# Patient Record
Sex: Female | Born: 1960 | Race: Black or African American | Hispanic: No | Marital: Married | Smoking: Never smoker
Health system: Southern US, Community
[De-identification: ages and names within clinical notes are randomized; demographics above are authoritative.]

## PROBLEM LIST (undated history)

## (undated) DIAGNOSIS — B019 Varicella without complication: Secondary | ICD-10-CM

## (undated) DIAGNOSIS — E119 Type 2 diabetes mellitus without complications: Secondary | ICD-10-CM

## (undated) DIAGNOSIS — I609 Nontraumatic subarachnoid hemorrhage, unspecified: Secondary | ICD-10-CM

## (undated) DIAGNOSIS — D649 Anemia, unspecified: Secondary | ICD-10-CM

## (undated) DIAGNOSIS — I639 Cerebral infarction, unspecified: Secondary | ICD-10-CM

## (undated) DIAGNOSIS — M1990 Unspecified osteoarthritis, unspecified site: Secondary | ICD-10-CM

## (undated) DIAGNOSIS — F32A Depression, unspecified: Secondary | ICD-10-CM

## (undated) DIAGNOSIS — T7840XA Allergy, unspecified, initial encounter: Secondary | ICD-10-CM

## (undated) DIAGNOSIS — K219 Gastro-esophageal reflux disease without esophagitis: Secondary | ICD-10-CM

## (undated) DIAGNOSIS — F329 Major depressive disorder, single episode, unspecified: Secondary | ICD-10-CM

## (undated) DIAGNOSIS — J45909 Unspecified asthma, uncomplicated: Secondary | ICD-10-CM

## (undated) DIAGNOSIS — I10 Essential (primary) hypertension: Secondary | ICD-10-CM

## (undated) HISTORY — DX: Allergy, unspecified, initial encounter: T78.40XA

## (undated) HISTORY — DX: Varicella without complication: B01.9

## (undated) HISTORY — DX: Gastro-esophageal reflux disease without esophagitis: K21.9

## (undated) HISTORY — DX: Major depressive disorder, single episode, unspecified: F32.9

## (undated) HISTORY — DX: Cerebral infarction, unspecified: I63.9

## (undated) HISTORY — DX: Unspecified asthma, uncomplicated: J45.909

## (undated) HISTORY — DX: Unspecified osteoarthritis, unspecified site: M19.90

## (undated) HISTORY — PX: ELBOW SURGERY: SHX618

## (undated) HISTORY — DX: Anemia, unspecified: D64.9

## (undated) HISTORY — DX: Depression, unspecified: F32.A

## (undated) HISTORY — DX: Essential (primary) hypertension: I10

## (undated) HISTORY — DX: Type 2 diabetes mellitus without complications: E11.9

## (undated) HISTORY — DX: Nontraumatic subarachnoid hemorrhage, unspecified: I60.9

---

## 1998-02-26 ENCOUNTER — Encounter: Admission: RE | Admit: 1998-02-26 | Discharge: 1998-02-26 | Payer: Self-pay | Admitting: *Deleted

## 1999-01-26 ENCOUNTER — Ambulatory Visit (HOSPITAL_COMMUNITY): Admission: RE | Admit: 1999-01-26 | Discharge: 1999-01-26 | Payer: Self-pay | Admitting: Family Medicine

## 1999-01-26 ENCOUNTER — Encounter: Payer: Self-pay | Admitting: Family Medicine

## 1999-08-05 ENCOUNTER — Other Ambulatory Visit: Admission: RE | Admit: 1999-08-05 | Discharge: 1999-08-05 | Payer: Self-pay | Admitting: Family Medicine

## 1999-08-11 ENCOUNTER — Ambulatory Visit (HOSPITAL_COMMUNITY): Admission: RE | Admit: 1999-08-11 | Discharge: 1999-08-11 | Payer: Self-pay | Admitting: Family Medicine

## 1999-08-11 ENCOUNTER — Encounter: Payer: Self-pay | Admitting: Family Medicine

## 1999-09-14 ENCOUNTER — Encounter: Admission: RE | Admit: 1999-09-14 | Discharge: 1999-12-13 | Payer: Self-pay | Admitting: Family Medicine

## 2000-07-15 ENCOUNTER — Ambulatory Visit (HOSPITAL_COMMUNITY): Admission: RE | Admit: 2000-07-15 | Discharge: 2000-07-15 | Payer: Self-pay | Admitting: Family Medicine

## 2000-07-15 ENCOUNTER — Encounter: Payer: Self-pay | Admitting: Family Medicine

## 2000-07-26 ENCOUNTER — Encounter: Admission: RE | Admit: 2000-07-26 | Discharge: 2000-07-26 | Payer: Self-pay | Admitting: Family Medicine

## 2000-07-26 ENCOUNTER — Encounter: Payer: Self-pay | Admitting: Family Medicine

## 2000-08-09 ENCOUNTER — Encounter: Admission: RE | Admit: 2000-08-09 | Discharge: 2000-11-07 | Payer: Self-pay | Admitting: Anesthesiology

## 2001-07-14 ENCOUNTER — Encounter: Payer: Self-pay | Admitting: Family Medicine

## 2001-07-14 ENCOUNTER — Encounter: Admission: RE | Admit: 2001-07-14 | Discharge: 2001-07-14 | Payer: Self-pay | Admitting: Family Medicine

## 2004-04-28 ENCOUNTER — Ambulatory Visit: Payer: Self-pay | Admitting: *Deleted

## 2004-10-27 ENCOUNTER — Encounter: Admission: RE | Admit: 2004-10-27 | Discharge: 2004-10-27 | Payer: Self-pay | Admitting: Family Medicine

## 2004-11-09 HISTORY — PX: KNEE ARTHROSCOPY: SUR90

## 2004-11-10 ENCOUNTER — Encounter: Admission: RE | Admit: 2004-11-10 | Discharge: 2004-11-10 | Payer: Self-pay | Admitting: Family Medicine

## 2004-11-16 ENCOUNTER — Ambulatory Visit (HOSPITAL_BASED_OUTPATIENT_CLINIC_OR_DEPARTMENT_OTHER): Admission: RE | Admit: 2004-11-16 | Discharge: 2004-11-16 | Payer: Self-pay | Admitting: Orthopedic Surgery

## 2004-11-16 ENCOUNTER — Ambulatory Visit (HOSPITAL_COMMUNITY): Admission: RE | Admit: 2004-11-16 | Discharge: 2004-11-16 | Payer: Self-pay | Admitting: Orthopedic Surgery

## 2005-09-09 HISTORY — PX: TENDON REPAIR: SHX5111

## 2005-10-15 ENCOUNTER — Encounter: Admission: RE | Admit: 2005-10-15 | Discharge: 2005-10-15 | Payer: Self-pay | Admitting: Family Medicine

## 2006-07-12 HISTORY — PX: ACHILLES TENDON REPAIR: SUR1153

## 2006-10-12 ENCOUNTER — Encounter: Admission: RE | Admit: 2006-10-12 | Discharge: 2006-10-12 | Payer: Self-pay

## 2007-07-24 ENCOUNTER — Emergency Department (HOSPITAL_COMMUNITY): Admission: EM | Admit: 2007-07-24 | Discharge: 2007-07-25 | Payer: Self-pay | Admitting: Emergency Medicine

## 2008-01-10 HISTORY — PX: ACHILLES TENDON REPAIR: SUR1153

## 2009-09-09 ENCOUNTER — Encounter: Admission: RE | Admit: 2009-09-09 | Discharge: 2009-09-09 | Payer: Self-pay | Admitting: Family Medicine

## 2009-09-09 DIAGNOSIS — I609 Nontraumatic subarachnoid hemorrhage, unspecified: Secondary | ICD-10-CM

## 2009-09-09 HISTORY — DX: Nontraumatic subarachnoid hemorrhage, unspecified: I60.9

## 2009-09-22 ENCOUNTER — Inpatient Hospital Stay (HOSPITAL_COMMUNITY): Admission: EM | Admit: 2009-09-22 | Discharge: 2009-10-25 | Payer: Self-pay | Admitting: Emergency Medicine

## 2009-09-23 ENCOUNTER — Encounter (INDEPENDENT_AMBULATORY_CARE_PROVIDER_SITE_OTHER): Payer: Self-pay | Admitting: Neurosurgery

## 2009-09-26 ENCOUNTER — Encounter (INDEPENDENT_AMBULATORY_CARE_PROVIDER_SITE_OTHER): Payer: Self-pay | Admitting: Neurosurgery

## 2009-10-06 ENCOUNTER — Ambulatory Visit: Payer: Self-pay | Admitting: Internal Medicine

## 2010-07-09 ENCOUNTER — Ambulatory Visit (HOSPITAL_COMMUNITY)
Admission: RE | Admit: 2010-07-09 | Discharge: 2010-07-09 | Payer: Self-pay | Source: Home / Self Care | Attending: Neurosurgery | Admitting: Neurosurgery

## 2010-07-22 ENCOUNTER — Ambulatory Visit (HOSPITAL_COMMUNITY)
Admission: RE | Admit: 2010-07-22 | Discharge: 2010-07-22 | Payer: Self-pay | Source: Home / Self Care | Attending: Neurosurgery | Admitting: Neurosurgery

## 2010-08-19 ENCOUNTER — Ambulatory Visit (HOSPITAL_COMMUNITY)
Admission: RE | Admit: 2010-08-19 | Discharge: 2010-08-19 | Disposition: A | Discharge status: Home / Self Care | Payer: BC Managed Care – PPO | Source: Ambulatory Visit | Attending: Physician Assistant | Admitting: Physician Assistant

## 2010-08-19 ENCOUNTER — Other Ambulatory Visit (HOSPITAL_COMMUNITY): Payer: Self-pay | Admitting: Physician Assistant

## 2010-08-19 DIAGNOSIS — R52 Pain, unspecified: Secondary | ICD-10-CM

## 2010-08-19 DIAGNOSIS — M169 Osteoarthritis of hip, unspecified: Secondary | ICD-10-CM | POA: Insufficient documentation

## 2010-08-19 DIAGNOSIS — M1610 Unilateral primary osteoarthritis, unspecified hip: Secondary | ICD-10-CM | POA: Insufficient documentation

## 2010-08-19 DIAGNOSIS — M25559 Pain in unspecified hip: Secondary | ICD-10-CM | POA: Insufficient documentation

## 2010-09-29 LAB — GLUCOSE, CAPILLARY
Glucose-Capillary: 125 mg/dL — ABNORMAL HIGH (ref 70–99)
Glucose-Capillary: 175 mg/dL — ABNORMAL HIGH (ref 70–99)
Glucose-Capillary: 178 mg/dL — ABNORMAL HIGH (ref 70–99)
Glucose-Capillary: 238 mg/dL — ABNORMAL HIGH (ref 70–99)
Glucose-Capillary: 238 mg/dL — ABNORMAL HIGH (ref 70–99)
Glucose-Capillary: 99 mg/dL (ref 70–99)

## 2010-09-30 LAB — GLUCOSE, CAPILLARY
Glucose-Capillary: 100 mg/dL — ABNORMAL HIGH (ref 70–99)
Glucose-Capillary: 112 mg/dL — ABNORMAL HIGH (ref 70–99)
Glucose-Capillary: 113 mg/dL — ABNORMAL HIGH (ref 70–99)
Glucose-Capillary: 117 mg/dL — ABNORMAL HIGH (ref 70–99)
Glucose-Capillary: 117 mg/dL — ABNORMAL HIGH (ref 70–99)
Glucose-Capillary: 118 mg/dL — ABNORMAL HIGH (ref 70–99)
Glucose-Capillary: 121 mg/dL — ABNORMAL HIGH (ref 70–99)
Glucose-Capillary: 125 mg/dL — ABNORMAL HIGH (ref 70–99)
Glucose-Capillary: 135 mg/dL — ABNORMAL HIGH (ref 70–99)
Glucose-Capillary: 136 mg/dL — ABNORMAL HIGH (ref 70–99)
Glucose-Capillary: 139 mg/dL — ABNORMAL HIGH (ref 70–99)
Glucose-Capillary: 143 mg/dL — ABNORMAL HIGH (ref 70–99)
Glucose-Capillary: 144 mg/dL — ABNORMAL HIGH (ref 70–99)
Glucose-Capillary: 145 mg/dL — ABNORMAL HIGH (ref 70–99)
Glucose-Capillary: 148 mg/dL — ABNORMAL HIGH (ref 70–99)
Glucose-Capillary: 150 mg/dL — ABNORMAL HIGH (ref 70–99)
Glucose-Capillary: 152 mg/dL — ABNORMAL HIGH (ref 70–99)
Glucose-Capillary: 156 mg/dL — ABNORMAL HIGH (ref 70–99)
Glucose-Capillary: 159 mg/dL — ABNORMAL HIGH (ref 70–99)
Glucose-Capillary: 164 mg/dL — ABNORMAL HIGH (ref 70–99)
Glucose-Capillary: 165 mg/dL — ABNORMAL HIGH (ref 70–99)
Glucose-Capillary: 166 mg/dL — ABNORMAL HIGH (ref 70–99)
Glucose-Capillary: 170 mg/dL — ABNORMAL HIGH (ref 70–99)
Glucose-Capillary: 170 mg/dL — ABNORMAL HIGH (ref 70–99)
Glucose-Capillary: 171 mg/dL — ABNORMAL HIGH (ref 70–99)
Glucose-Capillary: 177 mg/dL — ABNORMAL HIGH (ref 70–99)
Glucose-Capillary: 192 mg/dL — ABNORMAL HIGH (ref 70–99)
Glucose-Capillary: 193 mg/dL — ABNORMAL HIGH (ref 70–99)
Glucose-Capillary: 195 mg/dL — ABNORMAL HIGH (ref 70–99)
Glucose-Capillary: 197 mg/dL — ABNORMAL HIGH (ref 70–99)
Glucose-Capillary: 198 mg/dL — ABNORMAL HIGH (ref 70–99)
Glucose-Capillary: 199 mg/dL — ABNORMAL HIGH (ref 70–99)
Glucose-Capillary: 200 mg/dL — ABNORMAL HIGH (ref 70–99)
Glucose-Capillary: 201 mg/dL — ABNORMAL HIGH (ref 70–99)
Glucose-Capillary: 206 mg/dL — ABNORMAL HIGH (ref 70–99)
Glucose-Capillary: 210 mg/dL — ABNORMAL HIGH (ref 70–99)
Glucose-Capillary: 225 mg/dL — ABNORMAL HIGH (ref 70–99)
Glucose-Capillary: 226 mg/dL — ABNORMAL HIGH (ref 70–99)
Glucose-Capillary: 236 mg/dL — ABNORMAL HIGH (ref 70–99)
Glucose-Capillary: 239 mg/dL — ABNORMAL HIGH (ref 70–99)
Glucose-Capillary: 240 mg/dL — ABNORMAL HIGH (ref 70–99)
Glucose-Capillary: 243 mg/dL — ABNORMAL HIGH (ref 70–99)
Glucose-Capillary: 261 mg/dL — ABNORMAL HIGH (ref 70–99)
Glucose-Capillary: 262 mg/dL — ABNORMAL HIGH (ref 70–99)
Glucose-Capillary: 268 mg/dL — ABNORMAL HIGH (ref 70–99)
Glucose-Capillary: 271 mg/dL — ABNORMAL HIGH (ref 70–99)
Glucose-Capillary: 272 mg/dL — ABNORMAL HIGH (ref 70–99)
Glucose-Capillary: 273 mg/dL — ABNORMAL HIGH (ref 70–99)
Glucose-Capillary: 278 mg/dL — ABNORMAL HIGH (ref 70–99)
Glucose-Capillary: 295 mg/dL — ABNORMAL HIGH (ref 70–99)
Glucose-Capillary: 297 mg/dL — ABNORMAL HIGH (ref 70–99)
Glucose-Capillary: 309 mg/dL — ABNORMAL HIGH (ref 70–99)
Glucose-Capillary: 311 mg/dL — ABNORMAL HIGH (ref 70–99)
Glucose-Capillary: 311 mg/dL — ABNORMAL HIGH (ref 70–99)
Glucose-Capillary: 315 mg/dL — ABNORMAL HIGH (ref 70–99)
Glucose-Capillary: 85 mg/dL (ref 70–99)

## 2010-09-30 LAB — CSF CULTURE W GRAM STAIN
Culture: NO GROWTH
Culture: NO GROWTH
Gram Stain: NONE SEEN

## 2010-09-30 LAB — CBC
HCT: 32.6 % — ABNORMAL LOW (ref 36.0–46.0)
HCT: 34.1 % — ABNORMAL LOW (ref 36.0–46.0)
Hemoglobin: 10.7 g/dL — ABNORMAL LOW (ref 12.0–15.0)
Hemoglobin: 11 g/dL — ABNORMAL LOW (ref 12.0–15.0)
MCHC: 32.3 g/dL (ref 30.0–36.0)
MCHC: 33 g/dL (ref 30.0–36.0)
MCV: 77 fL — ABNORMAL LOW (ref 78.0–100.0)
MCV: 77.4 fL — ABNORMAL LOW (ref 78.0–100.0)
Platelets: 328 10*3/uL (ref 150–400)
Platelets: 358 10*3/uL (ref 150–400)
RBC: 4.23 MIL/uL (ref 3.87–5.11)
RBC: 4.4 MIL/uL (ref 3.87–5.11)
RDW: 17.8 % — ABNORMAL HIGH (ref 11.5–15.5)
RDW: 18.6 % — ABNORMAL HIGH (ref 11.5–15.5)
WBC: 7.8 10*3/uL (ref 4.0–10.5)
WBC: 8.3 10*3/uL (ref 4.0–10.5)

## 2010-09-30 LAB — BASIC METABOLIC PANEL
BUN: 5 mg/dL — ABNORMAL LOW (ref 6–23)
BUN: 7 mg/dL (ref 6–23)
BUN: 7 mg/dL (ref 6–23)
BUN: 8 mg/dL (ref 6–23)
CO2: 24 mEq/L (ref 19–32)
CO2: 27 mEq/L (ref 19–32)
CO2: 28 mEq/L (ref 19–32)
CO2: 28 mEq/L (ref 19–32)
Calcium: 9.2 mg/dL (ref 8.4–10.5)
Calcium: 9.3 mg/dL (ref 8.4–10.5)
Calcium: 9.3 mg/dL (ref 8.4–10.5)
Calcium: 9.4 mg/dL (ref 8.4–10.5)
Chloride: 101 mEq/L (ref 96–112)
Chloride: 101 mEq/L (ref 96–112)
Chloride: 102 mEq/L (ref 96–112)
Chloride: 104 mEq/L (ref 96–112)
Creatinine, Ser: 0.64 mg/dL (ref 0.4–1.2)
Creatinine, Ser: 0.78 mg/dL (ref 0.4–1.2)
Creatinine, Ser: 0.81 mg/dL (ref 0.4–1.2)
Creatinine, Ser: 0.9 mg/dL (ref 0.4–1.2)
GFR calc Af Amer: >60 mL/min (ref >60)
GFR calc Af Amer: >60 mL/min (ref >60)
GFR calc Af Amer: >60 mL/min (ref >60)
GFR calc Af Amer: >60 mL/min (ref >60)
GFR calc non Af Amer: >60 mL/min (ref >60)
GFR calc non Af Amer: >60 mL/min (ref >60)
GFR calc non Af Amer: >60 mL/min (ref >60)
GFR calc non Af Amer: >60 mL/min (ref >60)
Glucose, Bld: 231 mg/dL — ABNORMAL HIGH (ref 70–99)
Glucose, Bld: 307 mg/dL — ABNORMAL HIGH (ref 70–99)
Glucose, Bld: 344 mg/dL — ABNORMAL HIGH (ref 70–99)
Glucose, Bld: 79 mg/dL (ref 70–99)
Potassium: 3.3 mEq/L — ABNORMAL LOW (ref 3.5–5.1)
Potassium: 4 mEq/L (ref 3.5–5.1)
Potassium: 4.3 mEq/L (ref 3.5–5.1)
Potassium: 4.4 mEq/L (ref 3.5–5.1)
Sodium: 133 mEq/L — ABNORMAL LOW (ref 135–145)
Sodium: 133 mEq/L — ABNORMAL LOW (ref 135–145)
Sodium: 135 mEq/L (ref 135–145)
Sodium: 139 mEq/L (ref 135–145)

## 2010-09-30 LAB — CSF CELL COUNT WITH DIFFERENTIAL
Lymphs, CSF: 78 % (ref 40–80)
Monocyte-Macrophage-Spinal Fluid: 3 % — ABNORMAL LOW (ref 15–45)
RBC Count, CSF: 18 /mm3 — ABNORMAL HIGH
RBC Count, CSF: 2 /mm3 — ABNORMAL HIGH
Segmented Neutrophils-CSF: 19 % — ABNORMAL HIGH (ref 0–6)
Tube #: 2
WBC, CSF: 18 /mm3 (ref 0–5)
WBC, CSF: 9 /mm3 — ABNORMAL HIGH (ref 0–5)

## 2010-09-30 LAB — DIFFERENTIAL
Basophils Absolute: 0 10*3/uL (ref 0.0–0.1)
Basophils Relative: 0 % (ref 0–1)
Eosinophils Absolute: 0 10*3/uL (ref 0.0–0.7)
Eosinophils Relative: 0 % (ref 0–5)
Lymphocytes Relative: 10 % — ABNORMAL LOW (ref 12–46)
Lymphs Abs: 0.8 10*3/uL (ref 0.7–4.0)
Monocytes Absolute: 0.3 10*3/uL (ref 0.1–1.0)
Monocytes Relative: 3 % (ref 3–12)
Neutro Abs: 7.1 10*3/uL (ref 1.7–7.7)
Neutrophils Relative %: 86 % — ABNORMAL HIGH (ref 43–77)

## 2010-09-30 LAB — PROTEIN AND GLUCOSE, CSF
Glucose, CSF: 100 mg/dL — ABNORMAL HIGH (ref 43–76)
Glucose, CSF: 54 mg/dL (ref 43–76)
Total  Protein, CSF: 20 mg/dL (ref 15–45)
Total  Protein, CSF: 30 mg/dL (ref 15–45)

## 2010-09-30 LAB — PATHOLOGIST SMEAR REVIEW

## 2010-09-30 LAB — VANCOMYCIN, TROUGH
Vancomycin Tr: 12.3 ug/mL (ref 10.0–20.0)
Vancomycin Tr: 18.5 ug/mL (ref 10.0–20.0)
Vancomycin Tr: 23.5 ug/mL — ABNORMAL HIGH (ref 10.0–20.0)

## 2010-09-30 LAB — APTT: aPTT: 29 seconds (ref 24–37)

## 2010-09-30 LAB — GRAM STAIN

## 2010-09-30 LAB — PROTIME-INR
INR: 1.07 (ref 0.00–1.49)
Prothrombin Time: 13.8 seconds (ref 11.6–15.2)

## 2010-10-04 LAB — GLUCOSE, CAPILLARY
Glucose-Capillary: 128 mg/dL — ABNORMAL HIGH (ref 70–99)
Glucose-Capillary: 132 mg/dL — ABNORMAL HIGH (ref 70–99)
Glucose-Capillary: 164 mg/dL — ABNORMAL HIGH (ref 70–99)
Glucose-Capillary: 169 mg/dL — ABNORMAL HIGH (ref 70–99)
Glucose-Capillary: 180 mg/dL — ABNORMAL HIGH (ref 70–99)
Glucose-Capillary: 183 mg/dL — ABNORMAL HIGH (ref 70–99)
Glucose-Capillary: 184 mg/dL — ABNORMAL HIGH (ref 70–99)
Glucose-Capillary: 186 mg/dL — ABNORMAL HIGH (ref 70–99)
Glucose-Capillary: 187 mg/dL — ABNORMAL HIGH (ref 70–99)
Glucose-Capillary: 189 mg/dL — ABNORMAL HIGH (ref 70–99)
Glucose-Capillary: 201 mg/dL — ABNORMAL HIGH (ref 70–99)
Glucose-Capillary: 207 mg/dL — ABNORMAL HIGH (ref 70–99)
Glucose-Capillary: 208 mg/dL — ABNORMAL HIGH (ref 70–99)
Glucose-Capillary: 208 mg/dL — ABNORMAL HIGH (ref 70–99)
Glucose-Capillary: 210 mg/dL — ABNORMAL HIGH (ref 70–99)
Glucose-Capillary: 210 mg/dL — ABNORMAL HIGH (ref 70–99)
Glucose-Capillary: 211 mg/dL — ABNORMAL HIGH (ref 70–99)
Glucose-Capillary: 212 mg/dL — ABNORMAL HIGH (ref 70–99)
Glucose-Capillary: 212 mg/dL — ABNORMAL HIGH (ref 70–99)
Glucose-Capillary: 214 mg/dL — ABNORMAL HIGH (ref 70–99)
Glucose-Capillary: 216 mg/dL — ABNORMAL HIGH (ref 70–99)
Glucose-Capillary: 217 mg/dL — ABNORMAL HIGH (ref 70–99)
Glucose-Capillary: 221 mg/dL — ABNORMAL HIGH (ref 70–99)
Glucose-Capillary: 222 mg/dL — ABNORMAL HIGH (ref 70–99)
Glucose-Capillary: 222 mg/dL — ABNORMAL HIGH (ref 70–99)
Glucose-Capillary: 222 mg/dL — ABNORMAL HIGH (ref 70–99)
Glucose-Capillary: 223 mg/dL — ABNORMAL HIGH (ref 70–99)
Glucose-Capillary: 228 mg/dL — ABNORMAL HIGH (ref 70–99)
Glucose-Capillary: 229 mg/dL — ABNORMAL HIGH (ref 70–99)
Glucose-Capillary: 229 mg/dL — ABNORMAL HIGH (ref 70–99)
Glucose-Capillary: 229 mg/dL — ABNORMAL HIGH (ref 70–99)
Glucose-Capillary: 231 mg/dL — ABNORMAL HIGH (ref 70–99)
Glucose-Capillary: 231 mg/dL — ABNORMAL HIGH (ref 70–99)
Glucose-Capillary: 231 mg/dL — ABNORMAL HIGH (ref 70–99)
Glucose-Capillary: 233 mg/dL — ABNORMAL HIGH (ref 70–99)
Glucose-Capillary: 236 mg/dL — ABNORMAL HIGH (ref 70–99)
Glucose-Capillary: 238 mg/dL — ABNORMAL HIGH (ref 70–99)
Glucose-Capillary: 241 mg/dL — ABNORMAL HIGH (ref 70–99)
Glucose-Capillary: 243 mg/dL — ABNORMAL HIGH (ref 70–99)
Glucose-Capillary: 245 mg/dL — ABNORMAL HIGH (ref 70–99)
Glucose-Capillary: 245 mg/dL — ABNORMAL HIGH (ref 70–99)
Glucose-Capillary: 248 mg/dL — ABNORMAL HIGH (ref 70–99)
Glucose-Capillary: 251 mg/dL — ABNORMAL HIGH (ref 70–99)
Glucose-Capillary: 256 mg/dL — ABNORMAL HIGH (ref 70–99)
Glucose-Capillary: 258 mg/dL — ABNORMAL HIGH (ref 70–99)
Glucose-Capillary: 261 mg/dL — ABNORMAL HIGH (ref 70–99)
Glucose-Capillary: 262 mg/dL — ABNORMAL HIGH (ref 70–99)
Glucose-Capillary: 263 mg/dL — ABNORMAL HIGH (ref 70–99)
Glucose-Capillary: 269 mg/dL — ABNORMAL HIGH (ref 70–99)
Glucose-Capillary: 274 mg/dL — ABNORMAL HIGH (ref 70–99)
Glucose-Capillary: 274 mg/dL — ABNORMAL HIGH (ref 70–99)
Glucose-Capillary: 275 mg/dL — ABNORMAL HIGH (ref 70–99)
Glucose-Capillary: 276 mg/dL — ABNORMAL HIGH (ref 70–99)
Glucose-Capillary: 278 mg/dL — ABNORMAL HIGH (ref 70–99)
Glucose-Capillary: 278 mg/dL — ABNORMAL HIGH (ref 70–99)
Glucose-Capillary: 280 mg/dL — ABNORMAL HIGH (ref 70–99)
Glucose-Capillary: 284 mg/dL — ABNORMAL HIGH (ref 70–99)
Glucose-Capillary: 285 mg/dL — ABNORMAL HIGH (ref 70–99)
Glucose-Capillary: 285 mg/dL — ABNORMAL HIGH (ref 70–99)
Glucose-Capillary: 291 mg/dL — ABNORMAL HIGH (ref 70–99)
Glucose-Capillary: 292 mg/dL — ABNORMAL HIGH (ref 70–99)
Glucose-Capillary: 294 mg/dL — ABNORMAL HIGH (ref 70–99)
Glucose-Capillary: 299 mg/dL — ABNORMAL HIGH (ref 70–99)
Glucose-Capillary: 302 mg/dL — ABNORMAL HIGH (ref 70–99)
Glucose-Capillary: 305 mg/dL — ABNORMAL HIGH (ref 70–99)
Glucose-Capillary: 306 mg/dL — ABNORMAL HIGH (ref 70–99)
Glucose-Capillary: 307 mg/dL — ABNORMAL HIGH (ref 70–99)
Glucose-Capillary: 323 mg/dL — ABNORMAL HIGH (ref 70–99)
Glucose-Capillary: 323 mg/dL — ABNORMAL HIGH (ref 70–99)
Glucose-Capillary: 328 mg/dL — ABNORMAL HIGH (ref 70–99)
Glucose-Capillary: 335 mg/dL — ABNORMAL HIGH (ref 70–99)
Glucose-Capillary: 335 mg/dL — ABNORMAL HIGH (ref 70–99)
Glucose-Capillary: 339 mg/dL — ABNORMAL HIGH (ref 70–99)
Glucose-Capillary: 342 mg/dL — ABNORMAL HIGH (ref 70–99)
Glucose-Capillary: 346 mg/dL — ABNORMAL HIGH (ref 70–99)
Glucose-Capillary: 347 mg/dL — ABNORMAL HIGH (ref 70–99)
Glucose-Capillary: 354 mg/dL — ABNORMAL HIGH (ref 70–99)
Glucose-Capillary: 358 mg/dL — ABNORMAL HIGH (ref 70–99)
Glucose-Capillary: 370 mg/dL — ABNORMAL HIGH (ref 70–99)
Glucose-Capillary: 381 mg/dL — ABNORMAL HIGH (ref 70–99)
Glucose-Capillary: 403 mg/dL — ABNORMAL HIGH (ref 70–99)
Glucose-Capillary: 424 mg/dL — ABNORMAL HIGH (ref 70–99)

## 2010-10-04 LAB — URINE CULTURE: Colony Count: 1000

## 2010-10-04 LAB — BASIC METABOLIC PANEL
BUN: 16 mg/dL (ref 6–23)
BUN: 20 mg/dL (ref 6–23)
BUN: 8 mg/dL (ref 6–23)
BUN: 9 mg/dL (ref 6–23)
CO2: 24 mEq/L (ref 19–32)
CO2: 24 mEq/L (ref 19–32)
CO2: 26 mEq/L (ref 19–32)
CO2: 27 mEq/L (ref 19–32)
Calcium: 8.5 mg/dL (ref 8.4–10.5)
Calcium: 8.9 mg/dL (ref 8.4–10.5)
Calcium: 9.1 mg/dL (ref 8.4–10.5)
Calcium: 9.3 mg/dL (ref 8.4–10.5)
Chloride: 103 mEq/L (ref 96–112)
Chloride: 104 mEq/L (ref 96–112)
Chloride: 94 mEq/L — ABNORMAL LOW (ref 96–112)
Chloride: 98 mEq/L (ref 96–112)
Creatinine, Ser: 0.8 mg/dL (ref 0.4–1.2)
Creatinine, Ser: 0.89 mg/dL (ref 0.4–1.2)
Creatinine, Ser: 0.93 mg/dL (ref 0.4–1.2)
Creatinine, Ser: 1.05 mg/dL (ref 0.4–1.2)
GFR calc Af Amer: >60 mL/min (ref >60)
GFR calc Af Amer: >60 mL/min (ref >60)
GFR calc Af Amer: >60 mL/min (ref >60)
GFR calc Af Amer: >60 mL/min (ref >60)
GFR calc non Af Amer: 55 mL/min — ABNORMAL LOW (ref >60)
GFR calc non Af Amer: >60 mL/min (ref >60)
GFR calc non Af Amer: >60 mL/min (ref >60)
GFR calc non Af Amer: >60 mL/min (ref >60)
Glucose, Bld: 167 mg/dL — ABNORMAL HIGH (ref 70–99)
Glucose, Bld: 274 mg/dL — ABNORMAL HIGH (ref 70–99)
Glucose, Bld: 292 mg/dL — ABNORMAL HIGH (ref 70–99)
Glucose, Bld: 320 mg/dL — ABNORMAL HIGH (ref 70–99)
Potassium: 3.5 mEq/L (ref 3.5–5.1)
Potassium: 3.8 mEq/L (ref 3.5–5.1)
Potassium: 4 mEq/L (ref 3.5–5.1)
Potassium: 4 mEq/L (ref 3.5–5.1)
Sodium: 128 mEq/L — ABNORMAL LOW (ref 135–145)
Sodium: 132 mEq/L — ABNORMAL LOW (ref 135–145)
Sodium: 133 mEq/L — ABNORMAL LOW (ref 135–145)
Sodium: 137 mEq/L (ref 135–145)

## 2010-10-04 LAB — CBC
HCT: 30.1 % — ABNORMAL LOW (ref 36.0–46.0)
HCT: 31 % — ABNORMAL LOW (ref 36.0–46.0)
HCT: 32.7 % — ABNORMAL LOW (ref 36.0–46.0)
Hemoglobin: 10.1 g/dL — ABNORMAL LOW (ref 12.0–15.0)
Hemoglobin: 10.6 g/dL — ABNORMAL LOW (ref 12.0–15.0)
Hemoglobin: 9.9 g/dL — ABNORMAL LOW (ref 12.0–15.0)
MCHC: 32.3 g/dL (ref 30.0–36.0)
MCHC: 32.6 g/dL (ref 30.0–36.0)
MCHC: 32.8 g/dL (ref 30.0–36.0)
MCV: 75.8 fL — ABNORMAL LOW (ref 78.0–100.0)
MCV: 76.2 fL — ABNORMAL LOW (ref 78.0–100.0)
MCV: 77.3 fL — ABNORMAL LOW (ref 78.0–100.0)
Platelets: 294 10*3/uL (ref 150–400)
Platelets: 317 10*3/uL (ref 150–400)
Platelets: 338 10*3/uL (ref 150–400)
RBC: 3.97 MIL/uL (ref 3.87–5.11)
RBC: 4.07 MIL/uL (ref 3.87–5.11)
RBC: 4.23 MIL/uL (ref 3.87–5.11)
RDW: 16.1 % — ABNORMAL HIGH (ref 11.5–15.5)
RDW: 16.5 % — ABNORMAL HIGH (ref 11.5–15.5)
RDW: 16.7 % — ABNORMAL HIGH (ref 11.5–15.5)
WBC: 10.7 10*3/uL — ABNORMAL HIGH (ref 4.0–10.5)
WBC: 11.7 10*3/uL — ABNORMAL HIGH (ref 4.0–10.5)
WBC: 16.8 10*3/uL — ABNORMAL HIGH (ref 4.0–10.5)

## 2010-10-04 LAB — CSF CELL COUNT WITH DIFFERENTIAL
Eosinophils, CSF: 0 % (ref 0–1)
Lymphs, CSF: 28 % — ABNORMAL LOW (ref 40–80)
Monocyte-Macrophage-Spinal Fluid: 12 % — ABNORMAL LOW (ref 15–45)
RBC Count, CSF: 610 /mm3 — ABNORMAL HIGH
Segmented Neutrophils-CSF: 60 % — ABNORMAL HIGH (ref 0–6)
Tube #: 3
WBC, CSF: 160 /mm3 (ref 0–5)

## 2010-10-04 LAB — DIFFERENTIAL
Basophils Absolute: 0 10*3/uL (ref 0.0–0.1)
Basophils Absolute: 0 10*3/uL (ref 0.0–0.1)
Basophils Relative: 0 % (ref 0–1)
Basophils Relative: 0 % (ref 0–1)
Eosinophils Absolute: 0 10*3/uL (ref 0.0–0.7)
Eosinophils Absolute: 0 10*3/uL (ref 0.0–0.7)
Eosinophils Relative: 0 % (ref 0–5)
Eosinophils Relative: 0 % (ref 0–5)
Lymphocytes Relative: 16 % (ref 12–46)
Lymphocytes Relative: 9 % — ABNORMAL LOW (ref 12–46)
Lymphs Abs: 1.5 10*3/uL (ref 0.7–4.0)
Lymphs Abs: 1.7 10*3/uL (ref 0.7–4.0)
Monocytes Absolute: 0.5 10*3/uL (ref 0.1–1.0)
Monocytes Absolute: 1.2 10*3/uL — ABNORMAL HIGH (ref 0.1–1.0)
Monocytes Relative: 11 % (ref 3–12)
Monocytes Relative: 3 % (ref 3–12)
Neutro Abs: 14.9 10*3/uL — ABNORMAL HIGH (ref 1.7–7.7)
Neutro Abs: 7.8 10*3/uL — ABNORMAL HIGH (ref 1.7–7.7)
Neutrophils Relative %: 73 % (ref 43–77)
Neutrophils Relative %: 88 % — ABNORMAL HIGH (ref 43–77)

## 2010-10-04 LAB — URINALYSIS, ROUTINE W REFLEX MICROSCOPIC
Bilirubin Urine: NEGATIVE
Glucose, UA: 250 mg/dL — AB
Ketones, ur: NEGATIVE mg/dL
Nitrite: NEGATIVE
Protein, ur: NEGATIVE mg/dL
Specific Gravity, Urine: 1.009 (ref 1.005–1.030)
Urobilinogen, UA: 1 mg/dL (ref 0.0–1.0)
pH: 7.5 (ref 5.0–8.0)

## 2010-10-04 LAB — URINE MICROSCOPIC-ADD ON

## 2010-10-04 LAB — PROTIME-INR
INR: 0.99 (ref 0.00–1.49)
Prothrombin Time: 13 seconds (ref 11.6–15.2)

## 2010-10-04 LAB — CULTURE, BLOOD (ROUTINE X 2): Culture: NO GROWTH

## 2010-10-04 LAB — POCT CARDIAC MARKERS
CKMB, poc: 1.9 ng/mL (ref 1.0–8.0)
Myoglobin, poc: 125 ng/mL (ref 12–200)
Troponin i, poc: <0.05 ng/mL (ref 0.00–0.09)

## 2010-10-04 LAB — CSF CULTURE W GRAM STAIN

## 2010-10-04 LAB — PROTEIN AND GLUCOSE, CSF
Glucose, CSF: 94 mg/dL — ABNORMAL HIGH (ref 43–76)
Total  Protein, CSF: 46 mg/dL — ABNORMAL HIGH (ref 15–45)

## 2010-10-04 LAB — VANCOMYCIN, TROUGH: Vancomycin Tr: 16.6 ug/mL (ref 10.0–20.0)

## 2010-10-04 LAB — PATHOLOGIST SMEAR REVIEW

## 2010-10-04 LAB — APTT: aPTT: 21 seconds — ABNORMAL LOW (ref 24–37)

## 2010-10-04 LAB — MRSA PCR SCREENING: MRSA by PCR: NEGATIVE

## 2010-11-27 NOTE — Op Note (Signed)
[facility]  Patient:    Carla Jensen, Carla Jensen                        MRN: [id number] Proc. Date: 08/17/00 Adm. Date:  [id number] Attending:  Thyra Breed CC:         Sharlot Gowda., M.D.   Operative Report  PROCEDURE:  Lumbar epidural steroid injection.  DIAGNOSIS:  Degenerative disk disease and facet joint arthropathy with radiculopathy into the right lower extremity.  ANESTHESIOLOGIST:  Thyra Breed, M.D.  INTERVAL HISTORY: The patient has noted that her radicular discomfort is markedly improved.  She still had some left-sided lower back discomfort, but overall she feels better.  PHYSICAL EXAMINATION:  VITAL SIGNS:  Blood pressure 130/55, heart rate 110, respiratory rate 20, O2 saturation 98%, pain level 7/10.  GENERAL:  The patient is very anxious today.  BACK:  Shows good healing from previous injection site.  DESCRIPTION OF PROCEDURE:  After informed consent was obtained, the patient was placed in the sitting position and monitored.  Her back was prepped with Betadine x 3.  A skin wheal was raised at the L4-5 interspace with 1% lidocaine.  A 20-gauge Tuohy needle was introduced in the lumbar epidural space to loss of resistance to preservative free normal saline.  There was no CSF nor blood.  Medrol 40 mg and 5 ml preservative free normal saline was gently injected.  The needle was flushed with preservative free normal saline and removed intact.  POSTPROCEDURE CONDITION:  Stable.  DISCHARGE INSTRUCTIONS: 1. Resume previous diet. 2. Limitation of activities per instruction sheet. 3. Continue on current medications. 4. Follow up with me in one week for a third injection. DD:  08/17/00 TD:  08/18/00 Job: [id number]

## 2010-11-27 NOTE — Procedures (Signed)
[facility]  Patient:    Carla Jensen, Carla Jensen                        MRN: [id number] Proc. Date: 08/10/00 Adm. Date:  [id number] Attending:  Thyra Breed CC:         Geraldo Pitter, M.D.  Sharlot Gowda., M.D.   Procedure Report  PROCEDURE:  Lumbar epidural steroid injection.  DIAGNOSIS:  Degenerative disk disease and facet joint arthritis of the lumbar spine with early radiculopathy into the right lower extremity affecting the hip.  HISTORY OF PRESENT ILLNESS:  Carla Jensen is a very pleasant 50 year old who is sent to [place] by Dr. Frederico Hamman for a series of lumbar epidural steroid injections. The patient states that she was in her usual state of good health up until January 3 when she awoke with a burning, throbbing, toothache discomfort radiating out from her lower back to her right hip predominantly and to a lesser extent to the left. She was seen by Dr. Parke Simmers on July 16, 2000 and given Darvocet and Celebrex. She did not improve and was reevaluated on January 12 and started on Flexeril which has helped to take the edge off. Because of her ongoing discomfort, she underwent an MRI of her back on July 26, 2000 which showed degenerative disk disease at L5-S1 with broad based bulging disk and central disk protrusion as well as facet joint arthropathy at multiple levels. She was sent to Dr. Madelon Lips who recommended epidural steroid injections initially. The patient describes increased pain with sitting for long periods of time or sleeping and improvement with her Flexeril. She denied numbness or tingling, weakness, although she is developing cramps in her hip and leg on the right side predominantly. She denied any bowel or bladder incontinence.  CURRENT MEDICATIONS:  Flexeril 10 mg three times a day, Darvocet two tablets q. 6h p.r.n., Celebrex 200 mg twice a day complicated by some increased dyspepsia, diovan, Aciphex, Allegra, and  Flovent.  ALLERGIES:  No known drug allergies.  FAMILY HISTORY:  Positive for thyroid disease, hypertension, and osteoarthritis.  PAST SURGICAL HISTORY:  Significant for three cesarean sections otherwise no other surgeries.  SOCIAL HISTORY:  The patient is a nonsmoker, nondrinker. 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  ACTIVE MEDICAL PROBLEMS:  Hypertension, asthma, gastroesophageal reflux disease, history of low iron in the past and seasonal rhinitis and dog dander allergies.  REVIEW OF SYSTEMS:  GENERAL:  Negative.  HEAD:  Negative. EYES: Significant for blurred vision from her medications recently. NOSE/MOUTH/THROAT:  See active medical problems. EARS:  Negative. PULMONARY: See active medical problems. CARDIOVASCULAR: Chronic hypertension. GI: She notes some dyspepsia increase on the celebrex exacerbating her gastroesophageal reflux disease. GU: Negative. MUSCULOSKELETAL:  See HPI. NEUROLOGIC: No history of seizure or strokes. See HPI. HEMATOLOGIC:  See active medical problems. ENDOCRINE: Negative. CUTANEOUS:  Negative. PSYCHIATRIC:  Negative. ALLERGY: See active medical problems.  PHYSICAL EXAMINATION:  VITAL SIGNS:  Blood pressure 131/66, heart rate 100, respiratory rate 20, O2 saturations 100%, temperature 97.2, pain level is 5/10.  GENERAL:  This is a pleasant, obese female in no acute distress.  HEENT:  Head was normocephalic, atraumatic. Eyes, extraocular movements intact with conjunctivae and sclerae clear. Nose patent nares without discharge. Oropharynx was free of lesions.  NECK:  Supple without lymphadenopathy.  LUNGS:  Clear to auscultation and percussion.  HEART:  Regular rate and rhythm.  BREASTS/ABDOMINAL/PELVIC/RECTAL:  Not performed.  BACK:  Revealed accentuated lordosis of the lumbar spine. Straight leg raise signs were negative. Gait was intact.  EXTREMITIES:  No cyanosis, clubbing or edema. The radial pulses and  dorsalis pedis pulse 2+ and symmetric.  NEUROLOGIC:  The patient was oriented x 4. Cranial nerves II-XII are grossly intact. Deep tendon reflexes were symmetric in the upper and lower extremity with downgoing toes. Motor was 5/5 with symmetric bulk and tone. Sensory was intact to pinprick, light touch and vibratory sense. Coordination was grossly intact.  IMPRESSION: 1. Low back pain predominantly on the basis of degenerative disk disease with    disk protrusion predominantly affecting the L5-S1 space to the right    with some underlying facet joint arthropathy which is relatively    asymptomatic. 2. Other medical problems per Dr. Parke Simmers which include asthma, hypertension,    gastroesophageal reflux disease, seasonal rhinitis, and history of iron    deficiency anemia.  DISPOSITION:  I discussed the potential risks, benefits and limitations of a lumbar epidural steroid injection in detail with the patient as well as reviewed the side effects of corticosteroids in detail. The patient is interested in pursuing this.  DESCRIPTION OF PROCEDURE:  After informed consent was obtained, the patient was placed in the sitting position and monitored. The patients back was prepped with Betadine x 3 and a skin wheal was raised. I placed a 20 gauge Tuohy needle into the interspinous ligament and the patient felt nauseated. Her heart rate drifted down to the 60s and the needle was removed and she was allowed to lay on her left side until she recovered from this. She was placed back in the sitting position and her back was prepped with alcohol x 3 and this was allowed to dry. I reanesthetized the skin at the L4-5 interspace with 1% lidocaine. A 20 gauge Tuohy needle was introduced to the lumbar epidural space to loss of resistance to preservative free normal saline. There was no cerebrospinal fluid nor blood. 40 mg of Medrol and 4 ml of preservative free  normal saline was gently injected. The needle  was flushed with preservative free normal saline and removed intact.  CONDITION POST PROCEDURE:  Stable.  DISCHARGE INSTRUCTIONS:  Resume previous diet. Limitations in activities per instruction sheet as outlined by my assistant today. Continue on current medications. Follow-up with me in 1-2 weeks for repeat injection. DD:  08/10/00 TD:  08/10/00 Job: [id number]

## 2010-11-27 NOTE — Op Note (Signed)
NAMEVERITY, GILCREST                 ACCOUNT NO.:  [id number]   MEDICAL RECORD NO.:  [id number]          PATIENT TYPE:  AMB   LOCATION:  [facility]                          FACILITY:  [facility]   PHYSICIAN:  Mila Homer. Sherlean Foot, M.D. DATE OF BIRTH:  Apr 11, 1961   DATE OF PROCEDURE:  11/16/2004  DATE OF DISCHARGE:                                 OPERATIVE REPORT   SURGEON:  Mila Homer. Sherlean Foot, M.D.   ASSISTANT:  None.   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   PROCEDURE:  Right knee arthroscopy with partial medial meniscectomy  chondroplasty in the medial and patellofemoral compartments and  microfracture on the medial femoral condyle.   INDICATIONS FOR PROCEDURE:  The patient is a 50 year old black female with  MRI evidence of a posterior horn medial meniscus tear, radiographic evidence  of some medial compartment and patellofemoral compartment arthritis.  Mechanical symptoms plus the MRI brought [place] to the operating room. Informed  consent was obtained.   DESCRIPTION OF PROCEDURE:  The patient was laid supine after general  anesthesia and knee block in the preanesthesia holding area. The right knee  was prepped and draped in the usual sterile fashion. Inferolateral and  inferomedial portals were created with a #11 blade, blunt trocar and  cannula. Diagnostic arthroscopy revealed medial facet grade 3 arthritis on  the patella. On the trochlea really more on the weightbearing portion of the  medial femoral condyle there was a full thickness grade 4 defect  approximately 1 x 1 cm. I debrided this __________ microfracture prick to  punch six holes and those did bleed when I let the pressure down. I then  went into flexion in the medial compartment in the flexion portion and there  was grade 3 chondromalacia over much of the medial femoral condyle. In  extension, there was  very little chondromalacia. There was a complex  posterior horn medial meniscus tear. I used a 4.2 great white shaver to  perform a chondroplasty on the medial femoral condyle and then the small  basket forceps and the great white shaver to perform a partial medial  meniscectomy. There were osteophytes in the notch that were small and did  not feel like they needed to be removed. The ACL and PCL appeared normal,  the lateral compartment was normal. I then lavaged and closed it with  interrupted 4-0 nylon sutures, dressed with Adaptic 4 x 4, sterile Webril  and an Ace wrap.   COMPLICATIONS:  None.   DRAINS:  None.     SDL/MEDQ  D:  11/16/2004  T:  11/16/2004  Job:  [id number]

## 2010-11-27 NOTE — Procedures (Signed)
[facility]  Patient:    Carla Jensen, Carla Jensen                        MRN: [id number] Proc. Date: 08/24/00 Adm. Date:  [id number] Attending:  Thyra Breed CC:         Sharlot Gowda., M.D.   Procedure Report  PROCEDURE:  Lumbar epidural steroid injection.  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  INTERVAL HISTORY:  The patients noted that her radicular discomfort is markedly improved but she continues to have a fair amount of lower back discomfort. It is especially associated with a lot of stiffness in the lower back and achiness. She feels as though she is a hundred at times. She does feel improved by the injections but she is not markedly improved. She still wants to get a third injection today.  PHYSICAL EXAMINATION:  Blood pressure 127/58, heart rate is 100, respiratory rate 20, O2 saturations 100%. Her back shows good healing from a previous injection site. Neuro exam is grossly unchanged.  DESCRIPTION OF PROCEDURE:  After informed consent was obtained, the patient was placed in the sitting position and monitored. The patients back was prepped with Betadine x 3. A skin wheal was raised at the L3-4 interspace with 1 percent lidocaine. A 20 gauge Tuohy needle was introduced to the lumbar epidural space to loss of resistance to preservative free normal saline. There was no cerebrospinal fluid nor blood. 40 mg of Medrol and 8 ml of preservative free normal saline was gently injected. The needle was flushed with preservative free normal saline and removed intact.  CONDITION POST PROCEDURE:  Stable.  DISCHARGE INSTRUCTIONS:  Resume previous diet. Limitations in activities per instruction sheet as outlined by my assistant today. Continue on current medications and consider apple cider vinegar/honey combination which I discussed with her in detail.  The patient plans the  follow-up with Dr. Madelon Lips. I advised her that it seems that her radicular pain is improved but she continues to have lower back discomfort which is probably reflective of her underlying facet joint arthropathy and degenerative disk disease. DD:  08/24/00 TD:  08/24/00 Job: [id number]

## 2012-11-29 ENCOUNTER — Ambulatory Visit (INDEPENDENT_AMBULATORY_CARE_PROVIDER_SITE_OTHER): Payer: Medicare Other | Admitting: Family Medicine

## 2012-11-29 ENCOUNTER — Encounter: Payer: Self-pay | Admitting: Family Medicine

## 2012-11-29 VITALS — BP 142/74 | HR 110 | Temp 98.7°F | Ht 62.0 in | Wt 260.8 lb

## 2012-11-29 DIAGNOSIS — M5126 Other intervertebral disc displacement, lumbar region: Secondary | ICD-10-CM

## 2012-11-29 DIAGNOSIS — E119 Type 2 diabetes mellitus without complications: Secondary | ICD-10-CM

## 2012-11-29 DIAGNOSIS — J209 Acute bronchitis, unspecified: Secondary | ICD-10-CM

## 2012-11-29 DIAGNOSIS — J45901 Unspecified asthma with (acute) exacerbation: Secondary | ICD-10-CM

## 2012-11-29 DIAGNOSIS — I10 Essential (primary) hypertension: Secondary | ICD-10-CM

## 2012-11-29 MED ORDER — AMOXICILLIN-POT CLAVULANATE 875-125 MG PO TABS: 1.0000 | ORAL_TABLET | Freq: Two times a day (BID) | ORAL | Status: DC

## 2012-11-29 MED ORDER — PREDNISONE 10 MG PO TABS: ORAL_TABLET | ORAL | Status: DC

## 2012-11-29 MED ORDER — ALBUTEROL SULFATE (2.5 MG/3ML) 0.083% IN NEBU: 2.5000 mg | INHALATION_SOLUTION | Freq: Four times a day (QID) | RESPIRATORY_TRACT | Status: DC | PRN

## 2012-11-29 MED ORDER — ALBUTEROL SULFATE HFA 108 (90 BASE) MCG/ACT IN AERS: 2.0000 | INHALATION_SPRAY | Freq: Four times a day (QID) | RESPIRATORY_TRACT | Status: DC | PRN

## 2012-11-29 MED ORDER — METHYLPREDNISOLONE ACETATE 80 MG/ML IJ SUSP
80.0000 mg | Freq: Once | INTRAMUSCULAR | Status: AC
  Administered 2012-11-29: 80 mg via INTRAMUSCULAR

## 2012-11-29 MED ORDER — GUAIFENESIN-CODEINE 100-10 MG/5ML PO SYRP: 5.0000 mL | ORAL_SOLUTION | Freq: Three times a day (TID) | ORAL | Status: DC | PRN

## 2012-11-29 MED ORDER — GLUCOSE BLOOD VI STRP: ORAL_STRIP | Status: DC

## 2012-11-29 MED ORDER — BECLOMETHASONE DIPROPIONATE 40 MCG/ACT IN AERS: 2.0000 | INHALATION_SPRAY | Freq: Two times a day (BID) | RESPIRATORY_TRACT | Status: DC

## 2012-11-29 MED ORDER — ALBUTEROL SULFATE (2.5 MG/3ML) 0.083% IN NEBU
2.5000 mg | INHALATION_SOLUTION | Freq: Once | RESPIRATORY_TRACT | Status: AC
  Administered 2012-11-29: 2.5 mg via RESPIRATORY_TRACT

## 2012-11-29 MED ORDER — CYCLOBENZAPRINE HCL 10 MG PO TABS: 10.0000 mg | ORAL_TABLET | Freq: Two times a day (BID) | ORAL | Status: DC | PRN

## 2012-11-29 MED ORDER — DILTIAZEM HCL ER 120 MG PO CP24: 120.0000 mg | ORAL_CAPSULE | Freq: Every day | ORAL | Status: DC

## 2012-11-29 NOTE — Patient Instructions (Addendum)
Asthma, Adult Asthma is a condition that affects your lungs. It is characterized by swelling and narrowing of your airways as well as increased mucus production. The narrowing comes from swelling and muscle spasms inside the airways. When this happens, breathing can be difficult and you can have coughing, wheezing, and shortness of breath. Knowing more about asthma can help you manage it better. Asthma cannot be cured, but medicines and lifestyle changes can help control it. Asthma can be a minor problem for some people but if it is not controlled it can lead to a life-threatening asthma attack. Asthma can change over time. It is important to work with your caregiver to manage your asthma symptoms. CAUSES The exact cause of asthma is unknown. Asthma is believed to be caused by inherited (genetic) and environmental exposures. Swelling and redness (inflammation) of the airways occurs in asthma. This can be triggered by allergies, viral lung infections, or irritants in the air. Allergic reactions can cause you to wheeze immediately or several hours after an exposure. Asthma triggers are different for each person. It is important to pay attention and know what triggers your asthma.  Common triggers for asthma attacks include:  Animal dander from the skin, hair, or feathers of animals.  Dust mites contained in house dust.  Cockroaches.  Pollen from trees or grass.  Mold.  Cigarette or tobacco smoke. Smoking cannot be allowed in homes of people with asthma. People with asthma should not smoke and should not be around smokers.  Air pollutants such as dust, household cleaners, hair sprays, aerosol sprays, paint fumes, strong chemicals, or strong odors.  Cold air or weather changes. Cold air may cause inflammation. Winds increase molds and pollens in the air. There is not one best climate for people with asthma.  Strong emotions such as crying or laughing hard.  Stress.  Certain medicines such as  aspirin or beta-blockers.  Sulfites in such foods and drinks as dried fruits and wine.  Infections or inflammatory conditions such as the flu, a cold, or an inflammation of the nasal membranes (rhinitis).  Gastroesophageal reflux disease (GERD). GERD is a condition where stomach acid backs up into your throat (esophagus).  Exercise or strenous activity. Proper pre-exercise medicines allow most people to participate in sports. SYMPTOMS  Feeling short of breath.  Chest tightness or pain.  Difficulty sleeping due to coughing, wheezing, or feeling short of breath.  A whistling or wheezing sound with exhalation.  Coughing or wheezing that is worse when you:  Have a virus (such as a cold or the flu).  Are suffering from allergies.  Are exposed to certain fumes or chemicals.  Exercise. Signs that your asthma is probably getting worse include:   More frequent and bothersome asthma signs and symptoms.  Increasing difficulty breathing. This can be measured by a peak flow meter, which is a simple device used to check how well your lungs are working.  An increasingly frequent need to use a quick-relief inhaler. DIAGNOSIS  The diagnosis of asthma is made by review of your medical history, a physical exam, and possibly from other tests. Lung function studies may help with the diagnosis. TREATMENT  Asthma cannot be cured. However, for the majority of adults, asthma can be controlled with treatment. Besides avoidance of triggers of your asthma, medicines are often required. There are 2 classes of medicine used for asthma treatment: controller medicines (reduce inflammation and symptoms) andreliever or rescue medicines (relieve asthma symptoms during acute attacks). You may require daily   medicines to control your asthma. The most effective long-term controller medicines for asthma are inhaled corticosteroids (blocks inflammation). Other long-term control medicines include:  Leukotriene  receptor antagonists (blocks a pathway of inflammation).  Long-acting beta2-agonists (relaxes the muscles of the airways for at least 12 hours) with an inhaled corticosteroid.  Cromolyn sodium or nedocromil (alters certain inflammatory cells' ability to release chemicals that cause inflammation).  Immunomodulators (alters the immune system to prevent asthma symptoms).  Theophylline (relaxes muscles in the airways). You may also require a short-acting beta2-agonist to relieve asthma symptoms during an acute attack. You should understand what to do during an acute attack. Inhaled medicines are effective when used properly. Read the instructions on how to use your medicines correctly and speak to your caregiver if you have questions. Follow up with your caregiver on a regular basis to make sure your asthma is well-controlled. If your asthma is not well-controlled, if you have been hospitalized for asthma, or if multiple medicines or medium to high doses of inhaled corticosteroids are needed to control your asthma, request a referral to an asthma specialist. HOME CARE INSTRUCTIONS   Take medicines as directed by your caregiver.  Control your home environment in the following ways to help prevent asthma attacks:  Change your heating and air conditioning filter at least once a month.  Place a filter or cheesecloth over your heating and air conditioning vents.  Limit the use of fireplaces and wood stoves.  Do not smoke. Do not stay in places where others are smoking.  Get rid of pests (such as roaches and mice) and their droppings.  If you see mold on a plant, throw it away.  Clean your floors and dust every week. Use unscented cleaning products. Use a vacuum cleaner with a HEPA filter if possible. If vacuuming or cleaning triggers your asthma, try to find someone else to do these chores.  Floors in your house should be wood, tile, or vinyl. Carpet can trap dander and dust.  Use  allergy-proof pillows, mattress covers, and box spring covers.  Wash bedsheets and blankets every week in hot water and dry in a dryer.  Use a blanket that is made of polyester or cotton with a tight nap.  Do not use a dust ruffle on your bed.  Clean bathrooms and kitchens with bleach and repaint with mold-resistant paint.  Wash hands frequently.  Talk to your caregiver about an action plan for managing asthma attacks. This includes the use of a peak flow meter which measures the severity of the attack and medicines that can help stop the attack. An action plan can help minimize or stop the attack without having to seek medical care.  Remain calm during an asthma attack.  Always have a plan prepared for seeking medical attention. This should include contacting your caregiver and in the case of a severe attack, calling your local emergency services (911 in [place] SEEK MEDICAL CARE IF:   You have wheezing, shortness of breath, or a cough even if taking medicine to prevent attacks.  You have thickening of sputum.  Your sputum changes from clear or white to yellow, green, gray, or bloody.  You have any problems that may be related to the medicines you are taking (such as a rash, itching, swelling, or trouble breathing).  You are using a reliever medicine more than 2 3 times per week.  Your peak flow is still at 50 79% of personal best after following your action plan for 1   hour. SEEK IMMEDIATE MEDICAL CARE IF:   You are short of breath even at rest.  You get short of breath when doing very little physical activity.  You have difficulty eating, drinking, or talking due to asthma symptoms.  You have chest pain or you feel that your heart is beating fast.  You have a bluish color to your lips or fingernails.  You are lightheaded, dizzy, or faint.  You have a fever or persistent symptoms for more than 2 3 days.  You have a fever and symptoms suddenly get worse.  You seem to be  getting worse and are unresponsive to treatment during an asthma attack.  Your peak flow is less than 50% of personal best. MAKE SURE YOU:   Understand these instructions.  Will watch your condition.  Will get help right away if you are not doing well or get worse. Document Released: 06/28/2005 Document Revised: 06/14/2012 Document Reviewed: 02/14/2008 [employer] Patient Information 2014 [employer].  

## 2012-11-29 NOTE — Progress Notes (Signed)
Subjective:     Carla Jensen is a 52 y.o. female here for evaluation of a cough. Onset of symptoms was 2 weeks ago. Symptoms have been gradually worsening since that time. The cough is barky and productive and is aggravated by fumes, infection, pollens and reclining position. Associated symptoms include: shortness of breath, sputum production and wheezing. Patient does have a history of asthma. Patient does have a history of environmental allergens. Patient has not traveled recently. Patient does not have a history of smoking. Patient has not had a previous chest x-ray. Patient has not had a PPD done.  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 She  does not have any pertinent problems on file. She  has past surgical history that includes Knee arthroscopy (Left, 5/06); Tendon repair (Right, 3/07); Achilles tendon repair (Left, 1/08); and Achilles tendon repair (Right, 7/09). Her family history includes Arthritis in her mother; Diabetes in her mother; and High blood pressure in her mother and sister. She  reports that she has never smoked. She has never used smokeless tobacco. She reports that she does not drink alcohol or use illicit drugs. She has a current medication list which includes the following prescription(s): albuterol, aspirin, chlorpheniramine-apap, cinnamon, cyclobenzaprine, dextromethorphan-guaifenesin, diltiazem, ferrous sulfate, gabapentin, glipizide, insulin detemir, multivitamin, naproxen sodium, pantoprazole, natural vegetable laxative, vitamin c, albuterol, amoxicillin-clavulanate, beclomethasone, glucose blood, guaifenesin-codeine, and prednisone. No current outpatient prescriptions on file prior to visit.   No current facility-administered medications on file  prior to visit.   She has No Known Allergies..  Review of Systems Review of Systems  Constitutional: Negative for activity change, appetite change and fatigue.  HENT: Negative for hearing loss, congestion, tinnitus and ear discharge.   Eyes: Negative for visual disturbance  Respiratory: Negative for cough, chest tightness and shortness of breath.   Cardiovascular: Negative for chest pain, palpitations and leg swelling.  Gastrointestinal: Negative for abdominal pain, diarrhea, constipation and abdominal distention.  Genitourinary: Negative for urgency, frequency, decreased urine volume and difficulty urinating.  Musculoskeletal: Negative for back pain, arthralgias and gait problem.  Skin: Negative for color change, pallor and rash.  Neurological: Negative for dizziness, light-headedness, numbness and headaches.  Hematological: Negative for adenopathy. Does not bruise/bleed easily.  Psychiatric/Behavioral: Negative for suicidal ideas, confusion, sleep disturbance, self-injury, dysphoric mood, decreased concentration and agitation.        Objective:    Oxygen saturation 99% on room air BP 142/74  Pulse 110  Temp(Src) 98.7 F (37.1 C) (Oral)  Ht 5\' 2"  (1.575 m)  Wt 260 lb 12.8 oz (118.298 kg)  BMI 47.69 kg/m2  SpO2 99% General appearance: alert, cooperative, appears stated age and no distress Ears: normal TM's and external ear canals both ears Nose: Nares normal. Septum midline. Mucosa normal. No drainage or sinus tenderness. Throat: lips, mucosa, and tongue normal; teeth and gums normal Neck: no adenopathy, supple, symmetrical, trachea midline and thyroid not enlarged, symmetric, no tenderness/mass/nodules Lungs: wheezes bilaterally Heart: S1, S2 normal Extremities: extremities normal, atraumatic, no cyanosis or edema    Assessment:    Acute Bronchitis and Asthma    Plan:    Antibiotics per medication orders. Antitussives per medication orders. Avoid exposure to  tobacco smoke and fumes. B-agonist inhaler. Call if shortness of breath worsens, blood in sputum, change in character of cough, development of fever or chills,  inability to maintain nutrition and hydration. Avoid exposure to tobacco smoke and fumes. Steroid inhaler as ordered. avoid strong perfumes etc

## 2012-11-30 ENCOUNTER — Telehealth: Payer: Self-pay | Admitting: General Practice

## 2012-11-30 MED ORDER — LANCETS THIN MISC: Status: AC

## 2012-11-30 MED ORDER — GLUCOSE BLOOD VI STRP: ORAL_STRIP | Status: AC

## 2012-11-30 MED ORDER — ONETOUCH ULTRA SYSTEM W/DEVICE KIT: 1.0000 | PACK | Freq: Once | Status: AC

## 2012-11-30 NOTE — Telephone Encounter (Signed)
Med switched to one touch per insurance company.

## 2012-12-01 ENCOUNTER — Encounter: Payer: Self-pay | Admitting: Lab

## 2012-12-05 ENCOUNTER — Other Ambulatory Visit: Payer: Medicare Other

## 2012-12-05 LAB — HEPATIC FUNCTION PANEL
ALT: 48 U/L — ABNORMAL HIGH (ref 0–35)
AST: 27 U/L (ref 0–37)
Albumin: 3.6 g/dL (ref 3.5–5.2)
Alkaline Phosphatase: 76 U/L (ref 39–117)
Bilirubin, Direct: 0 mg/dL (ref 0.0–0.3)
Total Bilirubin: 0.2 mg/dL — ABNORMAL LOW (ref 0.3–1.2)
Total Protein: 7.2 g/dL (ref 6.0–8.3)

## 2012-12-05 LAB — CBC WITH DIFFERENTIAL/PLATELET
Basophils Absolute: 0 10*3/uL (ref 0.0–0.1)
Basophils Relative: 0.4 % (ref 0.0–3.0)
Eosinophils Absolute: 0.1 10*3/uL (ref 0.0–0.7)
Eosinophils Relative: 1 % (ref 0.0–5.0)
HCT: 30 % — ABNORMAL LOW (ref 36.0–46.0)
Hemoglobin: 9.8 g/dL — ABNORMAL LOW (ref 12.0–15.0)
Lymphocytes Relative: 46.2 % — ABNORMAL HIGH (ref 12.0–46.0)
Lymphs Abs: 4.8 10*3/uL — ABNORMAL HIGH (ref 0.7–4.0)
MCHC: 32.7 g/dL (ref 30.0–36.0)
MCV: 75.5 fl — ABNORMAL LOW (ref 78.0–100.0)
Monocytes Absolute: 0.5 10*3/uL (ref 0.1–1.0)
Monocytes Relative: 4.6 % (ref 3.0–12.0)
Neutro Abs: 5 10*3/uL (ref 1.4–7.7)
Neutrophils Relative %: 47.8 % (ref 43.0–77.0)
Platelets: 309 10*3/uL (ref 150.0–400.0)
RBC: 3.98 Mil/uL (ref 3.87–5.11)
RDW: 17 % — ABNORMAL HIGH (ref 11.5–14.6)
WBC: 10.5 10*3/uL (ref 4.5–10.5)

## 2012-12-05 LAB — BASIC METABOLIC PANEL
BUN: 22 mg/dL (ref 6–23)
CO2: 27 mEq/L (ref 19–32)
Calcium: 9.3 mg/dL (ref 8.4–10.5)
Chloride: 102 mEq/L (ref 96–112)
Creatinine, Ser: 1.1 mg/dL (ref 0.4–1.2)
GFR: 63.82 mL/min (ref >60.00)
Glucose, Bld: 137 mg/dL — ABNORMAL HIGH (ref 70–99)
Potassium: 3.5 mEq/L (ref 3.5–5.1)
Sodium: 138 mEq/L (ref 135–145)

## 2012-12-05 LAB — LIPID PANEL
Cholesterol: 139 mg/dL (ref 0–200)
HDL: 56.5 mg/dL (ref >39.00)
LDL Cholesterol: 68 mg/dL (ref 0–99)
Total CHOL/HDL Ratio: 2
Triglycerides: 74 mg/dL (ref 0.0–149.0)
VLDL: 14.8 mg/dL (ref 0.0–40.0)

## 2012-12-05 LAB — MICROALBUMIN / CREATININE URINE RATIO
Creatinine,U: 178.2 mg/dL
Microalb Creat Ratio: 0.7 mg/g (ref 0.0–30.0)
Microalb, Ur: 1.3 mg/dL (ref 0.0–1.9)

## 2012-12-05 LAB — HEMOGLOBIN A1C: Hgb A1c MFr Bld: 8.4 % — ABNORMAL HIGH (ref 4.6–6.5)

## 2012-12-05 LAB — TSH: TSH: 0.87 u[IU]/mL (ref 0.35–5.50)

## 2012-12-11 ENCOUNTER — Other Ambulatory Visit (HOSPITAL_COMMUNITY)
Admission: RE | Admit: 2012-12-11 | Discharge: 2012-12-11 | Disposition: A | Discharge status: Home / Self Care | Payer: Medicare Other | Source: Ambulatory Visit | Attending: Family Medicine | Admitting: Family Medicine

## 2012-12-11 ENCOUNTER — Encounter: Payer: Self-pay | Admitting: Family Medicine

## 2012-12-11 ENCOUNTER — Ambulatory Visit (INDEPENDENT_AMBULATORY_CARE_PROVIDER_SITE_OTHER): Payer: Medicare Other | Admitting: Family Medicine

## 2012-12-11 VITALS — BP 124/60 | HR 115 | Temp 99.0°F | Ht 62.0 in | Wt 258.4 lb

## 2012-12-11 DIAGNOSIS — IMO0002 Reserved for concepts with insufficient information to code with codable children: Secondary | ICD-10-CM

## 2012-12-11 DIAGNOSIS — Z124 Encounter for screening for malignant neoplasm of cervix: Secondary | ICD-10-CM

## 2012-12-11 DIAGNOSIS — N950 Postmenopausal bleeding: Secondary | ICD-10-CM

## 2012-12-11 DIAGNOSIS — I10 Essential (primary) hypertension: Secondary | ICD-10-CM

## 2012-12-11 DIAGNOSIS — Z1151 Encounter for screening for human papillomavirus (HPV): Secondary | ICD-10-CM | POA: Insufficient documentation

## 2012-12-11 DIAGNOSIS — E1165 Type 2 diabetes mellitus with hyperglycemia: Secondary | ICD-10-CM

## 2012-12-11 DIAGNOSIS — E119 Type 2 diabetes mellitus without complications: Secondary | ICD-10-CM

## 2012-12-11 DIAGNOSIS — Z1239 Encounter for other screening for malignant neoplasm of breast: Secondary | ICD-10-CM

## 2012-12-11 DIAGNOSIS — N841 Polyp of cervix uteri: Secondary | ICD-10-CM

## 2012-12-11 DIAGNOSIS — E118 Type 2 diabetes mellitus with unspecified complications: Secondary | ICD-10-CM

## 2012-12-11 DIAGNOSIS — D649 Anemia, unspecified: Secondary | ICD-10-CM

## 2012-12-11 DIAGNOSIS — Z0000 Encounter for general adult medical examination without abnormal findings: Secondary | ICD-10-CM

## 2012-12-11 DIAGNOSIS — M545 Low back pain, unspecified: Secondary | ICD-10-CM

## 2012-12-11 DIAGNOSIS — Z01419 Encounter for gynecological examination (general) (routine) without abnormal findings: Secondary | ICD-10-CM | POA: Insufficient documentation

## 2012-12-11 MED ORDER — TIZANIDINE HCL 4 MG PO TABS: 4.0000 mg | ORAL_TABLET | Freq: Four times a day (QID) | ORAL | Status: DC | PRN

## 2012-12-11 MED ORDER — DILTIAZEM HCL ER 120 MG PO CP24: 120.0000 mg | ORAL_CAPSULE | Freq: Every day | ORAL | Status: DC

## 2012-12-11 MED ORDER — INSULIN DETEMIR 100 UNIT/ML FLEXPEN: 40.0000 [IU] | PEN_INJECTOR | Freq: Every day | SUBCUTANEOUS | Status: DC

## 2012-12-11 NOTE — Patient Instructions (Addendum)
Preventive Care for Adults, Female A healthy lifestyle and preventive care can promote health and wellness. Preventive health guidelines for women include the following key practices.  A routine yearly physical is a good way to check with your caregiver about your health and preventive screening. It is a chance to share any concerns and updates on your health, and to receive a thorough exam.  Visit your dentist for a routine exam and preventive care every 6 months. Brush your teeth twice a day and floss once a day. Good oral hygiene prevents tooth decay and gum disease.  The frequency of eye exams is based on your age, health, family medical history, use of contact lenses, and other factors. Follow your caregiver's recommendations for frequency of eye exams.  Eat a healthy diet. Foods like vegetables, fruits, whole grains, low-fat dairy products, and lean protein foods contain the nutrients you need without too many calories. Decrease your intake of foods high in solid fats, added sugars, and salt. Eat the right amount of calories for you.Get information about a proper diet from your caregiver, if necessary.  Regular physical exercise is one of the most important things you can do for your health. Most adults should get at least 150 minutes of moderate-intensity exercise (any activity that increases your heart rate and causes you to sweat) each week. In addition, most adults need muscle-strengthening exercises on 2 or more days a week.  Maintain a healthy weight. The body mass index (BMI) is a screening tool to identify possible weight problems. It provides an estimate of body fat based on height and weight. Your caregiver can help determine your BMI, and can help you achieve or maintain a healthy weight.For adults 20 years and older:  A BMI below 18.5 is considered underweight.  A BMI of 18.5 to 24.9 is normal.  A BMI of 25 to 29.9 is considered overweight.  A BMI of 30 and above is  considered obese.  Maintain normal blood lipids and cholesterol levels by exercising and minimizing your intake of saturated fat. Eat a balanced diet with plenty of fruit and vegetables. Blood tests for lipids and cholesterol should begin at age 20 and be repeated every 5 years. If your lipid or cholesterol levels are high, you are over 50, or you are at high risk for heart disease, you may need your cholesterol levels checked more frequently.Ongoing high lipid and cholesterol levels should be treated with medicines if diet and exercise are not effective.  If you smoke, find out from your caregiver how to quit. If you do not use tobacco, do not start.  If you are pregnant, do not drink alcohol. If you are breastfeeding, be very cautious about drinking alcohol. If you are not pregnant and choose to drink alcohol, do not exceed 1 drink per day. One drink is considered to be 12 ounces (355 mL) of beer, 5 ounces (148 mL) of wine, or 1.5 ounces (44 mL) of liquor.  Avoid use of street drugs. Do not share needles with anyone. Ask for help if you need support or instructions about stopping the use of drugs.  High blood pressure causes heart disease and increases the risk of stroke. Your blood pressure should be checked at least every 1 to 2 years. Ongoing high blood pressure should be treated with medicines if weight loss and exercise are not effective.  If you are 55 to 54 years old, ask your caregiver if you should take aspirin to prevent strokes.  Diabetes   screening involves taking a blood sample to check your fasting blood sugar level. This should be done once every 3 years, after age 45, if you are within normal weight and without risk factors for diabetes. Testing should be considered at a younger age or be carried out more frequently if you are overweight and have at least 1 risk factor for diabetes.  Breast cancer screening is essential preventive care for women. You should practice "breast  self-awareness." This means understanding the normal appearance and feel of your breasts and may include breast self-examination. Any changes detected, no matter how small, should be reported to a caregiver. Women in their 20s and 30s should have a clinical breast exam (CBE) by a caregiver as part of a regular health exam every 1 to 3 years. After age 40, women should have a CBE every year. Starting at age 40, women should consider having a mammography (breast X-ray test) every year. Women who have a family history of breast cancer should talk to their caregiver about genetic screening. Women at a high risk of breast cancer should talk to their caregivers about having magnetic resonance imaging (MRI) and a mammography every year.  The Pap test is a screening test for cervical cancer. A Pap test can show cell changes on the cervix that might become cervical cancer if left untreated. A Pap test is a procedure in which cells are obtained and examined from the lower end of the uterus (cervix).  Women should have a Pap test starting at age 21.  Between ages 21 and 29, Pap tests should be repeated every 2 years.  Beginning at age 30, you should have a Pap test every 3 years as long as the past 3 Pap tests have been normal.  Some women have medical problems that increase the chance of getting cervical cancer. Talk to your caregiver about these problems. It is especially important to talk to your caregiver if a new problem develops soon after your last Pap test. In these cases, your caregiver may recommend more frequent screening and Pap tests.  The above recommendations are the same for women who have or have not gotten the vaccine for human papillomavirus (HPV).  If you had a hysterectomy for a problem that was not cancer or a condition that could lead to cancer, then you no longer need Pap tests. Even if you no longer need a Pap test, a regular exam is a good idea to make sure no other problems are  starting.  If you are between ages 65 and 70, and you have had normal Pap tests going back 10 years, you no longer need Pap tests. Even if you no longer need a Pap test, a regular exam is a good idea to make sure no other problems are starting.  If you have had past treatment for cervical cancer or a condition that could lead to cancer, you need Pap tests and screening for cancer for at least 20 years after your treatment.  If Pap tests have been discontinued, risk factors (such as a new sexual partner) need to be reassessed to determine if screening should be resumed.  The HPV test is an additional test that may be used for cervical cancer screening. The HPV test looks for the virus that can cause the cell changes on the cervix. The cells collected during the Pap test can be tested for HPV. The HPV test could be used to screen women aged 30 years and older, and should   be used in women of any age who have unclear Pap test results. After the age of 30, women should have HPV testing at the same frequency as a Pap test.  Colorectal cancer can be detected and often prevented. Most routine colorectal cancer screening begins at the age of 50 and continues through age 75. However, your caregiver may recommend screening at an earlier age if you have risk factors for colon cancer. On a yearly basis, your caregiver may provide home test kits to check for hidden blood in the stool. Use of a small camera at the end of a tube, to directly examine the colon (sigmoidoscopy or colonoscopy), can detect the earliest forms of colorectal cancer. Talk to your caregiver about this at age 50, when routine screening begins. Direct examination of the colon should be repeated every 5 to 10 years through age 75, unless early forms of pre-cancerous polyps or small growths are found.  Hepatitis C blood testing is recommended for all people born from 1945 through 1965 and any individual with known risks for hepatitis C.  Practice  safe sex. Use condoms and avoid high-risk sexual practices to reduce the spread of sexually transmitted infections (STIs). STIs include gonorrhea, chlamydia, syphilis, trichomonas, herpes, HPV, and human immunodeficiency virus (HIV). Herpes, HIV, and HPV are viral illnesses that have no cure. They can result in disability, cancer, and death. Sexually active women aged 25 and younger should be checked for chlamydia. Older women with new or multiple partners should also be tested for chlamydia. Testing for other STIs is recommended if you are sexually active and at increased risk.  Osteoporosis is a disease in which the bones lose minerals and strength with aging. This can result in serious bone fractures. The risk of osteoporosis can be identified using a bone density scan. Women ages 65 and over and women at risk for fractures or osteoporosis should discuss screening with their caregivers. Ask your caregiver whether you should take a calcium supplement or vitamin D to reduce the rate of osteoporosis.  Menopause can be associated with physical symptoms and risks. Hormone replacement therapy is available to decrease symptoms and risks. You should talk to your caregiver about whether hormone replacement therapy is right for you.  Use sunscreen with sun protection factor (SPF) of 30 or more. Apply sunscreen liberally and repeatedly throughout the day. You should seek shade when your shadow is shorter than you. Protect yourself by wearing long sleeves, pants, a wide-brimmed hat, and sunglasses year round, whenever you are outdoors.  Once a month, do a whole body skin exam, using a mirror to look at the skin on your back. Notify your caregiver of new moles, moles that have irregular borders, moles that are larger than a pencil eraser, or moles that have changed in shape or color.  Stay current with required immunizations.  Influenza. You need a dose every fall (or winter). The composition of the flu vaccine  changes each year, so being vaccinated once is not enough.  Pneumococcal polysaccharide. You need 1 to 2 doses if you smoke cigarettes or if you have certain chronic medical conditions. You need 1 dose at age 65 (or older) if you have never been vaccinated.  Tetanus, diphtheria, pertussis (Tdap, Td). Get 1 dose of Tdap vaccine if you are younger than age 65, are over 65 and have contact with an infant, are a healthcare worker, are pregnant, or simply want to be protected from whooping cough. After that, you need a Td   booster dose every 10 years. Consult your caregiver if you have not had at least 3 tetanus and diphtheria-containing shots sometime in your life or have a deep or dirty wound.  HPV. You need this vaccine if you are a woman age 26 or younger. The vaccine is given in 3 doses over 6 months.  Measles, mumps, rubella (MMR). You need at least 1 dose of MMR if you were born in 1957 or later. You may also need a second dose.  Meningococcal. If you are age 19 to 21 and a first-year college student living in a residence [place], or have one of several medical conditions, you need to get vaccinated against meningococcal disease. You may also need additional booster doses.  Zoster (shingles). If you are age 60 or older, you should get this vaccine.  Varicella (chickenpox). If you have never had chickenpox or you were vaccinated but received only 1 dose, talk to your caregiver to find out if you need this vaccine.  Hepatitis A. You need this vaccine if you have a specific risk factor for hepatitis A virus infection or you simply wish to be protected from this disease. The vaccine is usually given as 2 doses, 6 to 18 months apart.  Hepatitis B. You need this vaccine if you have a specific risk factor for hepatitis B virus infection or you simply wish to be protected from this disease. The vaccine is given in 3 doses, usually over 6 months. Preventive Services / Frequency Ages 19 to 39  Blood  pressure check.** / Every 1 to 2 years.  Lipid and cholesterol check.** / Every 5 years beginning at age 20.  Clinical breast exam.** / Every 3 years for women in their 20s and 30s.  Pap test.** / Every 2 years from ages 21 through 29. Every 3 years starting at age 30 through age 65 or 70 with a history of 3 consecutive normal Pap tests.  HPV screening.** / Every 3 years from ages 30 through ages 65 to 70 with a history of 3 consecutive normal Pap tests.  Hepatitis C blood test.** / For any individual with known risks for hepatitis C.  Skin self-exam. / Monthly.  Influenza immunization.** / Every year.  Pneumococcal polysaccharide immunization.** / 1 to 2 doses if you smoke cigarettes or if you have certain chronic medical conditions.  Tetanus, diphtheria, pertussis (Tdap, Td) immunization. / A one-time dose of Tdap vaccine. After that, you need a Td booster dose every 10 years.  HPV immunization. / 3 doses over 6 months, if you are 26 and younger.  Measles, mumps, rubella (MMR) immunization. / You need at least 1 dose of MMR if you were born in 1957 or later. You may also need a second dose.  Meningococcal immunization. / 1 dose if you are age 19 to 21 and a first-year college student living in a residence [place], or have one of several medical conditions, you need to get vaccinated against meningococcal disease. You may also need additional booster doses.  Varicella immunization.** / Consult your caregiver.  Hepatitis A immunization.** / Consult your caregiver. 2 doses, 6 to 18 months apart.  Hepatitis B immunization.** / Consult your caregiver. 3 doses usually over 6 months. Ages 40 to 64  Blood pressure check.** / Every 1 to 2 years.  Lipid and cholesterol check.** / Every 5 years beginning at age 20.  Clinical breast exam.** / Every year after age 40.  Mammogram.** / Every year beginning at age 40   and continuing for as long as you are in good health. Consult with your  caregiver.  Pap test.** / Every 3 years starting at age 30 through age 65 or 70 with a history of 3 consecutive normal Pap tests.  HPV screening.** / Every 3 years from ages 30 through ages 65 to 70 with a history of 3 consecutive normal Pap tests.  Fecal occult blood test (FOBT) of stool. / Every year beginning at age 50 and continuing until age 75. You may not need to do this test if you get a colonoscopy every 10 years.  Flexible sigmoidoscopy or colonoscopy.** / Every 5 years for a flexible sigmoidoscopy or every 10 years for a colonoscopy beginning at age 50 and continuing until age 75.  Hepatitis C blood test.** / For all people born from 1945 through 1965 and any individual with known risks for hepatitis C.  Skin self-exam. / Monthly.  Influenza immunization.** / Every year.  Pneumococcal polysaccharide immunization.** / 1 to 2 doses if you smoke cigarettes or if you have certain chronic medical conditions.  Tetanus, diphtheria, pertussis (Tdap, Td) immunization.** / A one-time dose of Tdap vaccine. After that, you need a Td booster dose every 10 years.  Measles, mumps, rubella (MMR) immunization. / You need at least 1 dose of MMR if you were born in 1957 or later. You may also need a second dose.  Varicella immunization.** / Consult your caregiver.  Meningococcal immunization.** / Consult your caregiver.  Hepatitis A immunization.** / Consult your caregiver. 2 doses, 6 to 18 months apart.  Hepatitis B immunization.** / Consult your caregiver. 3 doses, usually over 6 months. Ages 65 and over  Blood pressure check.** / Every 1 to 2 years.  Lipid and cholesterol check.** / Every 5 years beginning at age 20.  Clinical breast exam.** / Every year after age 40.  Mammogram.** / Every year beginning at age 40 and continuing for as long as you are in good health. Consult with your caregiver.  Pap test.** / Every 3 years starting at age 30 through age 65 or 70 with a 3  consecutive normal Pap tests. Testing can be stopped between 65 and 70 with 3 consecutive normal Pap tests and no abnormal Pap or HPV tests in the past 10 years.  HPV screening.** / Every 3 years from ages 30 through ages 65 or 70 with a history of 3 consecutive normal Pap tests. Testing can be stopped between 65 and 70 with 3 consecutive normal Pap tests and no abnormal Pap or HPV tests in the past 10 years.  Fecal occult blood test (FOBT) of stool. / Every year beginning at age 50 and continuing until age 75. You may not need to do this test if you get a colonoscopy every 10 years.  Flexible sigmoidoscopy or colonoscopy.** / Every 5 years for a flexible sigmoidoscopy or every 10 years for a colonoscopy beginning at age 50 and continuing until age 75.  Hepatitis C blood test.** / For all people born from 1945 through 1965 and any individual with known risks for hepatitis C.  Osteoporosis screening.** / A one-time screening for women ages 65 and over and women at risk for fractures or osteoporosis.  Skin self-exam. / Monthly.  Influenza immunization.** / Every year.  Pneumococcal polysaccharide immunization.** / 1 dose at age 65 (or older) if you have never been vaccinated.  Tetanus, diphtheria, pertussis (Tdap, Td) immunization. / A one-time dose of Tdap vaccine if you are over   65 and have contact with an infant, are a healthcare worker, or simply want to be protected from whooping cough. After that, you need a Td booster dose every 10 years.  Varicella immunization.** / Consult your caregiver.  Meningococcal immunization.** / Consult your caregiver.  Hepatitis A immunization.** / Consult your caregiver. 2 doses, 6 to 18 months apart.  Hepatitis B immunization.** / Check with your caregiver. 3 doses, usually over 6 months. ** Family history and personal history of risk and conditions may change your caregiver's recommendations. Document Released: 08/24/2001 Document Revised: 09/20/2011  Document Reviewed: 11/23/2010 [employer] Patient Information 2014 [employer].  

## 2012-12-13 ENCOUNTER — Encounter: Payer: Self-pay | Admitting: Internal Medicine

## 2012-12-13 NOTE — Assessment & Plan Note (Signed)
Check labs con't meds 

## 2012-12-13 NOTE — Progress Notes (Signed)
Subjective:     Carla Jensen is a 52 y.o. female and is here for a comprehensive physical exam. The patient reports no problems.  History   Social History  . Marital Status: Married    Spouse Name: N/A    Number of Children: N/A  . Years of Education: N/A   Occupational History  . Not on file.   Social History Main Topics  . Smoking status: Never Smoker   . Smokeless tobacco: Never Used  . Alcohol Use: No  . Drug Use: No  . Sexually Active: Not on file   Other Topics Concern  . Not on file   Social History Narrative  . No narrative on file   Health Maintenance  Topic Date Due  . Pneumococcal Polysaccharide Vaccine (#1) 09/05/1960  . Foot Exam  09/05/1968  . Mammogram  09/05/2008  . Colonoscopy  09/05/2008  . Ophthalmology Exam  03/23/2012  . Influenza Vaccine  03/12/2013  . Urine Microalbumin  12/05/2013  . Pap Smear  12/12/2015  . Tetanus/tdap  12/11/2017    The following portions of the patient's history were reviewed and updated as appropriate:  She  has a past medical history of Asthma; Arthritis; Depression; GERD (gastroesophageal reflux disease); Allergy; Hypertension; Chickenpox; and Subarachnoid hemorrhage (3/11). She  does not have any pertinent problems on file. She  has past surgical history that includes Knee arthroscopy (Left, 5/06); Tendon repair (Right, 3/07); Achilles tendon repair (Left, 1/08); and Achilles tendon repair (Right, 7/09). Her family history includes Arthritis in her mother; Diabetes in her mother; and High blood pressure in her mother and sister. She  reports that she has never smoked. She has never used smokeless tobacco. She reports that she does not drink alcohol or use illicit drugs. She has a current medication list which includes the following prescription(s): albuterol, albuterol, aspirin, beclomethasone, one touch ultra system kit, chlorpheniramine-apap, cinnamon, dextromethorphan-guaifenesin, diltiazem, ferrous sulfate,  gabapentin, glipizide, glucose blood, guaifenesin-codeine, insulin detemir, lancets thin, multivitamin, naproxen sodium, pantoprazole, natural vegetable laxative, vitamin c, and tizanidine. Current Outpatient Prescriptions on File Prior to Visit  Medication Sig Dispense Refill  . albuterol (PROAIR HFA) 108 (90 BASE) MCG/ACT inhaler Inhale 2 puffs into the lungs every 6 (six) hours as needed for wheezing.  1 Inhaler  0  . albuterol (PROVENTIL) (2.5 MG/3ML) 0.083% nebulizer solution Take 3 mLs (2.5 mg total) by nebulization every 6 (six) hours as needed for wheezing.  75 mL  2  . aspirin 81 MG tablet Take 81 mg by mouth daily.      . beclomethasone (QVAR) 40 MCG/ACT inhaler Inhale 2 puffs into the lungs 2 (two) times daily.  1 Inhaler  12  . Blood Glucose Monitoring Suppl (ONE TOUCH ULTRA SYSTEM KIT) W/DEVICE KIT 1 kit by Does not apply route once. Dx. 250.00  1 each  0  . Chlorpheniramine-APAP (SB COLD & FLU HBP) 2-325 MG TABS Take by mouth.      Marland Kitchen CINNAMON PO Take 1,000 mg by mouth daily.      Marland Kitchen dextromethorphan-guaiFENesin (ROBITUSSIN-DM) 10-100 MG/5ML liquid Take 5 mLs by mouth every 4 (four) hours as needed for cough.      . ferrous sulfate 325 (65 FE) MG tablet Take 650 mg by mouth daily with breakfast.       . gabapentin (NEURONTIN) 300 MG capsule Take 300 mg by mouth 2 (two) times daily.       Marland Kitchen glipiZIDE (GLUCOTROL XL) 5 MG 24 hr tablet Take 5  mg by mouth daily.      Marland Kitchen glucose blood test strip Use as instructed. Pt tests sugars once daily. Dx. 250.00  100 each  12  . guaiFENesin-codeine (ROBITUSSIN AC) 100-10 MG/5ML syrup Take 5 mLs by mouth 3 (three) times daily as needed for cough.  120 mL  0  . Lancets Thin MISC Use as directed. Pt tests sugars once daily. Dx. 250.00  100 each  12  . Multiple Vitamin (MULTIVITAMIN) tablet Take 1 tablet by mouth daily.      . naproxen sodium (ANAPROX) 220 MG tablet Take 220 mg by mouth 2 (two) times daily with a meal.      . pantoprazole (PROTONIX) 40 MG  tablet Take 40 mg by mouth daily.      . Senna (NATURAL VEGETABLE LAXATIVE) 65-325 MG TABS Take 1 tablet by mouth 2 (two) times daily.       . vitamin C (ASCORBIC ACID) 500 MG tablet Take 1,000 mg by mouth daily.        No current facility-administered medications on file prior to visit.   She has No Known Allergies..  Review of Systems Review of Systems  Constitutional: Negative for activity change, appetite change and fatigue.  HENT: Negative for hearing loss, congestion, tinnitus and ear discharge.  dentist q50m Eyes: Negative for visual disturbance (see optho q1y -- vision corrected to 20/20 with glasses).  Respiratory: Negative for cough, chest tightness and shortness of breath.   Cardiovascular: Negative for chest pain, palpitations and leg swelling.  Gastrointestinal: Negative for abdominal pain, diarrhea, constipation and abdominal distention.  Genitourinary: Negative for urgency, frequency, decreased urine volume and difficulty urinating.  Musculoskeletal: Negative for back pain, arthralgias and gait problem.  Skin: Negative for color change, pallor and rash.  Neurological: Negative for dizziness, light-headedness, numbness and headaches.  Hematological: Negative for adenopathy. Does not bruise/bleed easily.  Psychiatric/Behavioral: Negative for suicidal ideas, confusion, sleep disturbance, self-injury, dysphoric mood, decreased concentration and agitation.       Objective:    BP 124/60  Pulse 115  Temp(Src) 99 F (37.2 C) (Oral)  Ht 5\' 2"  (1.575 m)  Wt 258 lb 6.4 oz (117.209 kg)  BMI 47.25 kg/m2  SpO2 99%  LMP 08/05/2010 General appearance: alert, cooperative, appears stated age and no distress Head: Normocephalic, without obvious abnormality, atraumatic Eyes: conjunctivae/corneas clear. PERRL, EOM's intact. Fundi benign. Ears: normal TM's and external ear canals both ears Nose: Nares normal. Septum midline. Mucosa normal. No drainage or sinus tenderness. Throat:  lips, mucosa, and tongue normal; teeth and gums normal Neck: no adenopathy, no carotid bruit, no JVD, supple, symmetrical, trachea midline and thyroid not enlarged, symmetric, no tenderness/mass/nodules Back: symmetric, no curvature. ROM normal. No CVA tenderness. Lungs: clear to auscultation bilaterally Breasts: normal appearance, no masses or tenderness Heart: regular rate and rhythm, S1, S2 normal, no murmur, click, rub or gallop Abdomen: soft, non-tender; bowel sounds normal; no masses,  no organomegaly Pelvic: cervix normal in appearance, external genitalia normal, no adnexal masses or tenderness, no cervical motion tenderness, rectovaginal septum normal, uterus normal size, shape, and consistency and vagina normal without discharge Extremities: extremities normal, atraumatic, no cyanosis or edema Pulses: 2+ and symmetric Skin: Skin color, texture, turgor normal. No rashes or lesions Lymph nodes: Cervical, supraclavicular, and axillary nodes normal. Neurologic: Alert and oriented X 3, normal strength and tone. Normal symmetric reflexes. Normal coordination and gait Psych--no anxiety, no depression      Assessment:    Healthy female exam.  Plan:  ghm utd Check labs   See After Visit Summary for Counseling Recommendations

## 2012-12-13 NOTE — Assessment & Plan Note (Signed)
Stable con't meds 

## 2012-12-15 ENCOUNTER — Telehealth: Payer: Self-pay

## 2012-12-15 MED ORDER — METRONIDAZOLE 500 MG PO TABS: 500.0000 mg | ORAL_TABLET | Freq: Two times a day (BID) | ORAL | Status: DC

## 2012-12-15 NOTE — Telephone Encounter (Signed)
Per patient send rx to [facility] , see recent pap results

## 2012-12-18 ENCOUNTER — Ambulatory Visit: Payer: Medicare Other | Admitting: Endocrinology

## 2012-12-25 ENCOUNTER — Ambulatory Visit (INDEPENDENT_AMBULATORY_CARE_PROVIDER_SITE_OTHER): Payer: Medicare Other | Admitting: Endocrinology

## 2012-12-25 ENCOUNTER — Encounter: Payer: Self-pay | Admitting: Endocrinology

## 2012-12-25 VITALS — BP 126/72 | Ht >= 80 in | Wt 255.0 lb

## 2012-12-25 DIAGNOSIS — E119 Type 2 diabetes mellitus without complications: Secondary | ICD-10-CM

## 2012-12-25 DIAGNOSIS — I619 Nontraumatic intracerebral hemorrhage, unspecified: Secondary | ICD-10-CM

## 2012-12-25 MED ORDER — INSULIN ASPART 100 UNIT/ML FLEXPEN: 20.0000 [IU] | PEN_INJECTOR | Freq: Three times a day (TID) | SUBCUTANEOUS | Status: DC

## 2012-12-25 NOTE — Patient Instructions (Addendum)
good diet and exercise habits significanly improve the control of your diabetes.  please let me know if you wish to be referred to a dietician.  high blood sugar is very risky to your health.  you should see an eye doctor every year.  You are at higher than average risk for pneumonia and hepatitis-B.  You should be vaccinated against both.   controlling your blood pressure and cholesterol drastically reduces the damage diabetes does to your body.  this also applies to quitting smoking.  please discuss these with your doctor.  check your blood sugar twice a day.  vary the time of day when you check, between before the 3 meals, and at bedtime.  also check if you have symptoms of your blood sugar being too high or too low.  please keep a record of the readings and bring it to your next appointment here.  please call us sooner if your blood sugar goes below 70, or if you have a lot of readings over 200. For now, change the levemir to "novolog," 20 units 3 times a day (just before each meal).   Please come back for a follow-up appointment in 1 month.

## 2012-12-25 NOTE — Progress Notes (Signed)
Subjective:    Patient ID: Carla Jensen, female    DOB: Oct 05, 1960, 52 y.o.   MRN: [id number]  HPI pt states 3 years h/o dm.  he is unaware of any chronic complications.  she has been on insulin since soon after dx.  pt says his diet is not good, and exercise is limited by med problems. She reports few years of moderate pain at the lower back, and assoc radiation to the RLE.  Past Medical History  Diagnosis Date  . Asthma   . Arthritis   . Depression   . GERD (gastroesophageal reflux disease)   . Allergy   . Hypertension   . Chickenpox   . 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 Knee arthroscopy Left 5/06  . Tendon repair Right 3/07    Elbow  . Achilles tendon repair Left 1/08    Dr.Norris  . Achilles tendon repair Right 7/09    Dr.Norris    History   Social History  . Marital Status: Married    Spouse Name: N/A    Number of Children: N/A  . Years of Education: N/A   Occupational History  . Not on file.   Social History Main Topics  . Smoking status: Never Smoker   . Smokeless tobacco: Never Used  . Alcohol Use: No  . Drug Use: No  . Sexually Active: Not on file   Other Topics Concern  . Not on file   Social History Narrative  . No narrative on file    Current Outpatient Prescriptions on File Prior to Visit  Medication Sig Dispense Refill  . albuterol (PROAIR HFA) 108 (90 BASE) MCG/ACT inhaler Inhale 2 puffs into the lungs every 6 (six) hours as needed for wheezing.  1 Inhaler  0  . albuterol (PROVENTIL) (2.5 MG/3ML) 0.083% nebulizer solution Take 3 mLs (2.5 mg total) by nebulization every 6 (six) hours as needed for wheezing.  75 mL  2  . aspirin 81 MG tablet Take 81 mg by mouth daily.      . beclomethasone (QVAR) 40 MCG/ACT inhaler Inhale 2 puffs into the lungs 2 (two) times daily.  1 Inhaler  12  . Blood Glucose Monitoring Suppl (ONE TOUCH ULTRA SYSTEM KIT)  W/DEVICE KIT 1 kit by Does not apply route once. Dx. 250.00  1 each  0  . Chlorpheniramine-APAP (SB COLD & FLU HBP) 2-325 MG TABS Take by mouth.      Marland Kitchen CINNAMON PO Take 1,000 mg by mouth daily.      Marland Kitchen dextromethorphan-guaiFENesin (ROBITUSSIN-DM) 10-100 MG/5ML liquid Take 5 mLs by mouth every 4 (four) hours as needed for cough.      . diltiazem (DILACOR XR) 120 MG 24 hr capsule Take 1 capsule (120 mg total) by mouth daily.  90 capsule  3  . ferrous sulfate 325 (65 FE) MG tablet Take 650 mg by mouth daily with breakfast.       . gabapentin (NEURONTIN) 300 MG capsule Take 300 mg by mouth 2 (two) times daily.       Marland Kitchen glipiZIDE (GLUCOTROL XL) 5 MG 24 hr tablet Take 5 mg by mouth daily.      Marland Kitchen glucose blood test strip Use as instructed. Pt tests sugars once daily. Dx. 250.00  100 each  12  . guaiFENesin-codeine (ROBITUSSIN AC) 100-10 MG/5ML syrup Take 5 mLs by mouth 3 (three) times daily as needed for cough.  120 mL  0  . Insulin Detemir (LEVEMIR FLEXPEN) 100 UNIT/ML SOPN Inject 40 Units into the skin at bedtime. 45 u sq qhs  5 pen  2  . Lancets Thin MISC Use as directed. Pt tests sugars once daily. Dx. 250.00  100 each  12  . metroNIDAZOLE (FLAGYL) 500 MG tablet Take 1 tablet (500 mg total) by mouth 2 (two) times daily.  14 tablet  0  . Multiple Vitamin (MULTIVITAMIN) tablet Take 1 tablet by mouth daily.      . naproxen sodium (ANAPROX) 220 MG tablet Take 220 mg by mouth 2 (two) times daily with a meal.      . pantoprazole (PROTONIX) 40 MG tablet Take 40 mg by mouth daily.      . Senna (NATURAL VEGETABLE LAXATIVE) 65-325 MG TABS Take 1 tablet by mouth 2 (two) times daily.       Marland Kitchen tiZANidine (ZANAFLEX) 4 MG tablet Take 1 tablet (4 mg total) by mouth every 6 (six) hours as needed.  30 tablet  0  . vitamin C (ASCORBIC ACID) 500 MG tablet Take 1,000 mg by mouth daily.        No current facility-administered medications on file prior to visit.    No Known Allergies  Family History  Problem Relation  Age of Onset  . Arthritis Mother   . High blood pressure Mother   . High blood pressure Sister   . Diabetes Mother     BP 126/72  Ht 6\' 10"  (2.083 m)  Wt 255 lb (115.667 kg)  BMI 26.66 kg/m2  SpO2 98%  LMP 08/05/2010  Review of Systems denies blurry vision, headache, chest pain, sob, n/v, urinary frequency, excessive diaphoresis, depression, hypoglycemia, rhinorrhea, and easy bruising.  She attributes memory loss to ICH.  She has insomnia, easy bruising, and muscle cramps.  She has lost a few lbs.     Objective:   Physical Exam VS: see vs page GEN: no distress HEAD: head: no deformity eyes: no periorbital swelling, no proptosis external nose and ears are normal mouth: no lesion seen NECK: supple, thyroid is not enlarged CHEST WALL: no deformity LUNGS:  Clear to auscultation CV: reg rate and rhythm, no murmur ABD: abdomen is soft, nontender.  no hepatosplenomegaly.  not distended.  no hernia MUSCULOSKELETAL: muscle bulk and strength are grossly normal.  no obvious joint swelling.  gait is normal and steady PULSES: no carotid bruit NEURO:  cn 2-12 grossly intact.   readily moves all 4's.   SKIN:  Normal texture and temperature.  No rash or suspicious lesion is visible.   NODES:  None palpable at the neck PSYCH: alert, oriented x3.  Does not appear anxious nor depressed.  Lab Results  Component Value Date   HGBA1C 8.4* 12/05/2012      Assessment & Plan:  DM: she needs increased rx.  This insulin regimen was chosen from multiple options, as it best matches his insulin to his changing requirements throughout the day.  The benefits of glycemic control must be weighed against the risks of hypoglycemia.   Low-back pain: this limits exercise rx of DM Memory loss: this could limits tid insulin, but we'll try.

## 2013-01-03 ENCOUNTER — Encounter: Payer: Self-pay | Admitting: Internal Medicine

## 2013-01-04 ENCOUNTER — Encounter: Payer: Self-pay | Admitting: Internal Medicine

## 2013-01-04 ENCOUNTER — Ambulatory Visit (INDEPENDENT_AMBULATORY_CARE_PROVIDER_SITE_OTHER): Payer: Medicare Other | Admitting: Internal Medicine

## 2013-01-04 ENCOUNTER — Other Ambulatory Visit: Payer: Self-pay | Admitting: Gastroenterology

## 2013-01-04 VITALS — BP 128/70 | HR 120 | Ht 62.0 in | Wt 254.4 lb

## 2013-01-04 DIAGNOSIS — Z1211 Encounter for screening for malignant neoplasm of colon: Secondary | ICD-10-CM

## 2013-01-04 DIAGNOSIS — D649 Anemia, unspecified: Secondary | ICD-10-CM

## 2013-01-04 DIAGNOSIS — K219 Gastro-esophageal reflux disease without esophagitis: Secondary | ICD-10-CM

## 2013-01-04 DIAGNOSIS — D509 Iron deficiency anemia, unspecified: Secondary | ICD-10-CM

## 2013-01-04 MED ORDER — PEG-KCL-NACL-NASULF-NA ASC-C 100 G PO SOLR: 1.0000 | Freq: Once | ORAL | Status: DC

## 2013-01-04 NOTE — Progress Notes (Signed)
Patient ID: Carla Jensen, female   DOB: 22-Jul-1960, 52 y.o.   MRN: [id number] HPI: [name] is a 52 year old female with past medical history of asthma, type 2 diabetes mellitus, hypertension, and remote intracranial hemorrhage who is seen in consultation at the request of Dr. Laury Axon for evaluation of screening colonoscopy and history of anemia with iron deficiency. She is here alone today. Patient denies specific GI complaint. She reports a long-standing history of anemia and she is taking over-the-counter oral iron twice daily. This did cause some constipation so she has been using Senokot on a daily basis as well. With this laxative she is having normal formed movements daily. She is seen no blood in her stool or melena. She denies lower abdominal pain, diarrhea or constipation at present. She does report a history of vaginal bleeding which is currently being worked up by her gynecologist. She has a history of uterine fibroids and has an upcoming endometrial biopsy on 01/11/2013. She also reports a history of heartburn and she is taking pantoprazole 40 mg daily. She is having breakthrough proximally 2 days per week. She was previously on pantoprazole twice daily but this was reduced to once daily. She denies dysphagia or odynophagia. No nausea or vomiting. No early satiety. She does occasionally have trouble swallowing large pills only but does well taking them with applesauce.  Patient Active Problem List   Diagnosis Date Noted  . ICH (intracerebral hemorrhage) 12/25/2012  . Unspecified asthma, with exacerbation 11/29/2012  . Lumbar herniated disc 11/29/2012  . Type II or unspecified type diabetes mellitus without mention of complication, not stated as uncontrolled 11/29/2012  . HTN (hypertension) 11/29/2012    Past Surgical History  Procedure Laterality Date  . Knee arthroscopy Left 5/06  . Tendon repair Right 3/07    Elbow  . Achilles tendon repair Left 1/08    Dr.Norris  . Achilles tendon  repair Right 7/09    Dr.Norris    Current Outpatient Prescriptions  Medication Sig Dispense Refill  . albuterol (PROAIR HFA) 108 (90 BASE) MCG/ACT inhaler Inhale 2 puffs into the lungs every 6 (six) hours as needed for wheezing.  1 Inhaler  0  . albuterol (PROVENTIL) (2.5 MG/3ML) 0.083% nebulizer solution Take 3 mLs (2.5 mg total) by nebulization every 6 (six) hours as needed for wheezing.  75 mL  2  . aspirin 81 MG tablet Take 81 mg by mouth daily.      . beclomethasone (QVAR) 40 MCG/ACT inhaler Inhale 2 puffs into the lungs 2 (two) times daily.  1 Inhaler  12  . Blood Glucose Monitoring Suppl (ONE TOUCH ULTRA SYSTEM KIT) W/DEVICE KIT 1 kit by Does not apply route once. Dx. 250.00  1 each  0  . Chlorpheniramine-APAP (SB COLD & FLU HBP) 2-325 MG TABS Take by mouth.      Marland Kitchen CINNAMON PO Take 1,000 mg by mouth daily.      Marland Kitchen dextromethorphan-guaiFENesin (ROBITUSSIN-DM) 10-100 MG/5ML liquid Take 5 mLs by mouth every 4 (four) hours as needed for cough.      . diltiazem (DILACOR XR) 120 MG 24 hr capsule Take 1 capsule (120 mg total) by mouth daily.  90 capsule  3  . ferrous sulfate 325 (65 FE) MG tablet Take 650 mg by mouth daily with breakfast.       . gabapentin (NEURONTIN) 300 MG capsule Take 300 mg by mouth 2 (two) times daily.       Marland Kitchen glucose blood test strip Use as instructed.  Pt tests sugars once daily. Dx. 250.00  100 each  12  . guaiFENesin-codeine (ROBITUSSIN AC) 100-10 MG/5ML syrup Take 5 mLs by mouth 3 (three) times daily as needed for cough.  120 mL  0  . insulin aspart (NOVOLOG FLEXPEN) 100 unit/mL SOLN FlexPen Inject 20 Units into the skin 3 (three) times daily with meals. And pen needles 3/day  10 pen  11  . Lancets Thin MISC Use as directed. Pt tests sugars once daily. Dx. 250.00  100 each  12  . metroNIDAZOLE (FLAGYL) 500 MG tablet Take 1 tablet (500 mg total) by mouth 2 (two) times daily.  14 tablet  0  . Multiple Vitamin (MULTIVITAMIN) tablet Take 1 tablet by mouth daily.      .  naproxen sodium (ANAPROX) 220 MG tablet Take 220 mg by mouth 2 (two) times daily with a meal.      . pantoprazole (PROTONIX) 40 MG tablet Take 40 mg by mouth daily.      . Senna (NATURAL VEGETABLE LAXATIVE) 65-325 MG TABS Take 1 tablet by mouth 2 (two) times daily.       . vitamin C (ASCORBIC ACID) 500 MG tablet Take 1,000 mg by mouth daily.       Marland Kitchen tiZANidine (ZANAFLEX) 4 MG tablet Take 1 tablet (4 mg total) by mouth every 6 (six) hours as needed.  30 tablet  0   No current facility-administered medications for this visit.    No Known Allergies  Family History  Problem Relation Age of Onset  . Arthritis Mother   . High blood pressure Mother   . High blood pressure Sister   . Diabetes Mother     History  Substance Use Topics  . Smoking status: Never Smoker   . Smokeless tobacco: Never Used  . Alcohol Use: No    ROS: As per history of present illness, otherwise negative  BP 128/70  Pulse 120  Ht 5\' 2"  (1.575 m)  Wt 254 lb 6.4 oz (115.395 kg)  BMI 46.52 kg/m2  LMP 08/05/2010 Constitutional: Well-developed and well-nourished. No distress. HEENT: Normocephalic and atraumatic. Oropharynx is clear and moist. No oropharyngeal exudate. Conjunctivae are normal.  No scleral icterus. Neck: Neck supple. Trachea midline. Cardiovascular: Normal rate, regular rhythm and intact distal pulses.  Pulmonary/chest: Effort normal and breath sounds normal. No wheezing, rales or rhonchi. Abdominal: Soft, nontender, obese, nondistended. Bowel sounds active throughout.  Extremities: no clubbing, cyanosis, or edema Lymphadenopathy: No cervical adenopathy noted. Neurological: Alert and oriented to person place and time. Skin: Skin is warm and dry. No rashes noted. Psychiatric: Normal mood and affect. Behavior is normal.  RELEVANT LABS AND IMAGING: CBC    Component Value Date/Time   WBC 10.5 12/05/2012 0917   RBC 3.98 12/05/2012 0917   HGB 9.8* 12/05/2012 0917   HCT 30.0* 12/05/2012 0917   PLT  309.0 12/05/2012 0917   MCV 75.5* 12/05/2012 0917   MCHC 32.7 12/05/2012 0917   RDW 17.0* 12/05/2012 0917   LYMPHSABS 4.8* 12/05/2012 0917   MONOABS 0.5 12/05/2012 0917   EOSABS 0.1 12/05/2012 0917   BASOSABS 0.0 12/05/2012 0917    CMP     Component Value Date/Time   NA 138 12/05/2012 0917   K 3.5 12/05/2012 0917   CL 102 12/05/2012 0917   CO2 27 12/05/2012 0917   GLUCOSE 137* 12/05/2012 0917   BUN 22 12/05/2012 0917   CREATININE 1.1 12/05/2012 0917   CALCIUM 9.3 12/05/2012 0917   PROT 7.2  12/05/2012 0917   ALBUMIN 3.6 12/05/2012 0917   AST 27 12/05/2012 0917   ALT 48* 12/05/2012 0917   ALKPHOS 76 12/05/2012 0917   BILITOT 0.2* 12/05/2012 0917   GFRNONAA >60 10/21/2009 1437   GFRAA  Value: >60        The eGFR has been calculated using the MDRD equation. This calculation has not been validated in all clinical situations. eGFR's persistently <60 mL/min signify possible Chronic Kidney Disease. 10/21/2009 1437   ASSESSMENT/PLAN:  52 year old female with past medical history of asthma, type 2 diabetes mellitus, hypertension, and remote intracranial hemorrhage who is seen in consultation at the request of Dr. Laury Axon for evaluation of screening colonoscopy and history of anemia with iron deficiency.   1.  CRC screening -- she is average risk for colorectal cancer and has never had screening colonoscopy. We discussed this test today including the risks and benefits and she is agreeable to proceed.  2.  Iron deficiency anemia -- she is having some vaginal bleeding which is being evaluated. I would like to repeat her CBC and iron studies today. She would like to try different iron she was given samples of Integra. She may continue to need Senokot to prevent constipation even with this formulation of iron.  3.  GERD -- some breakthrough symptoms on once daily PPI. I will switch her pantoprazole to Dexilant 60 mg daily. She was given samples today and if it is beneficial and works better for her we will write a  prescription for this medication. No alarm symptoms at this time. GERD hygiene/diet recommended.

## 2013-01-04 NOTE — Patient Instructions (Addendum)
You have been scheduled for a colonoscopy. Please follow written instructions given to you at your visit today. Please pick up your prep at the pharmacy within the next 2-3 days.  Discontinue protonix and over the counter iron  We have sent the following medications to your pharmacy for you to pick up at your convenience: Dexilant, integra, and Moviprep; you were given instructions at your visit today                                                We are excited to introduce MyChart, a new best-in-class service that provides you online access to important information in your electronic medical record. We want to make it easier for you to view your health information - all in one secure location - when and where you need it. We expect MyChart will enhance the quality of care and service we provide.  When you register for MyChart, you can:    View your test results.    Request appointments and receive appointment reminders via email.    Request medication renewals.    View your medical history, allergies, medications and immunizations.    Communicate with your physician's office through a password-protected site.    Conveniently print information such as your medication lists.  To find out if MyChart is right for you, please talk to a member of our clinical staff today. We will gladly answer your questions about this free health and wellness tool.  If you are age 54 or older and want a member of your family to have access to your record, you must provide written consent by completing a proxy form available at our office. Please speak to our clinical staff about guidelines regarding accounts for patients younger than age 64.  As you activate your MyChart account and need any technical assistance, please call the MyChart technical support line at [phone]) or email your question to [email or web address]. If you email your question(s), please include your name, a  return phone number and the best time to reach you.  If you have non-urgent health-related questions, you can send a message to our office through MyChart at [email or web address]. If you have a medical emergency, call 911.  Thank you for using MyChart as your new health and wellness resource!   MyChart licensed from [employer],  4540-9811. Patents Pending.

## 2013-01-05 ENCOUNTER — Encounter: Payer: Medicare Other | Admitting: Family Medicine

## 2013-01-08 ENCOUNTER — Encounter: Payer: Self-pay | Admitting: Internal Medicine

## 2013-01-11 ENCOUNTER — Ambulatory Visit
Admission: RE | Admit: 2013-01-11 | Discharge: 2013-01-11 | Disposition: A | Discharge status: Home / Self Care | Payer: Medicare Other | Source: Ambulatory Visit | Attending: Family Medicine | Admitting: Family Medicine

## 2013-01-11 DIAGNOSIS — Z1239 Encounter for other screening for malignant neoplasm of breast: Secondary | ICD-10-CM

## 2013-01-18 ENCOUNTER — Other Ambulatory Visit: Payer: Self-pay

## 2013-01-24 ENCOUNTER — Ambulatory Visit (INDEPENDENT_AMBULATORY_CARE_PROVIDER_SITE_OTHER): Payer: Medicare Other | Admitting: Endocrinology

## 2013-01-24 ENCOUNTER — Encounter: Payer: Self-pay | Admitting: Endocrinology

## 2013-01-24 VITALS — BP 138/70 | Ht 62.0 in | Wt 262.5 lb

## 2013-01-24 DIAGNOSIS — E119 Type 2 diabetes mellitus without complications: Secondary | ICD-10-CM

## 2013-01-24 MED ORDER — INSULIN ASPART 100 UNIT/ML FLEXPEN: 25.0000 [IU] | PEN_INJECTOR | Freq: Three times a day (TID) | SUBCUTANEOUS | Status: DC

## 2013-01-24 NOTE — Progress Notes (Signed)
Subjective:    Patient ID: Carla Jensen, female    DOB: May 13, 1961, 52 y.o.   MRN: [id number]  HPI pt returns for f/u of insulin-requiring DM (dx'ed 2011; He has mild if any neuropathy of the lower extremities; no associated chronic complications.  she brings a record of her cbg's which i have reviewed today.  It varies from 98-230, but most are in the low-100's.  pt states she feels well in general. Past Medical History  Diagnosis Date  . Asthma   . Arthritis   . Depression   . GERD (gastroesophageal reflux disease)   . Allergy   . Hypertension   . Chickenpox   . 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 Knee arthroscopy Left 5/06  . Tendon repair Right 3/07    Elbow  . Achilles tendon repair Left 1/08    Dr.Norris  . Achilles tendon repair Right 7/09    Dr.Norris    History   Social History  . Marital Status: Married    Spouse Name: N/A    Number of Children: N/A  . Years of Education: N/A   Occupational History  . Not on file.   Social History Main Topics  . Smoking status: Never Smoker   . Smokeless tobacco: Never Used  . Alcohol Use: No  . Drug Use: No  . Sexually Active: Not on file   Other Topics Concern  . Not on file   Social History Narrative  . No narrative on file    Current Outpatient Prescriptions on File Prior to Visit  Medication Sig Dispense Refill  . albuterol (PROAIR HFA) 108 (90 BASE) MCG/ACT inhaler Inhale 2 puffs into the lungs every 6 (six) hours as needed for wheezing.  1 Inhaler  0  . albuterol (PROVENTIL) (2.5 MG/3ML) 0.083% nebulizer solution Take 3 mLs (2.5 mg total) by nebulization every 6 (six) hours as needed for wheezing.  75 mL  2  . aspirin 81 MG tablet Take 81 mg by mouth daily.      . beclomethasone (QVAR) 40 MCG/ACT inhaler Inhale 2 puffs into the lungs 2 (two) times daily.  1 Inhaler  12  . Blood Glucose Monitoring Suppl (ONE TOUCH  ULTRA SYSTEM KIT) W/DEVICE KIT 1 kit by Does not apply route once. Dx. 250.00  1 each  0  . Chlorpheniramine-APAP (SB COLD & FLU HBP) 2-325 MG TABS Take by mouth.      Marland Kitchen CINNAMON PO Take 1,000 mg by mouth daily.      Marland Kitchen dextromethorphan-guaiFENesin (ROBITUSSIN-DM) 10-100 MG/5ML liquid Take 5 mLs by mouth every 4 (four) hours as needed for cough.      . diltiazem (DILACOR XR) 120 MG 24 hr capsule Take 1 capsule (120 mg total) by mouth daily.  90 capsule  3  . ferrous sulfate 325 (65 FE) MG tablet Take 650 mg by mouth daily with breakfast.       . gabapentin (NEURONTIN) 300 MG capsule Take 300 mg by mouth 2 (two) times daily.       Marland Kitchen glucose blood test strip Use as instructed. Pt tests sugars once daily. Dx. 250.00  100 each  12  . guaiFENesin-codeine (ROBITUSSIN AC) 100-10 MG/5ML syrup Take 5 mLs by mouth 3 (three) times daily as needed for cough.  120 mL  0  . Lancets Thin MISC Use as directed. Pt tests sugars once daily. Dx. 250.00  100  each  12  . metroNIDAZOLE (FLAGYL) 500 MG tablet Take 1 tablet (500 mg total) by mouth 2 (two) times daily.  14 tablet  0  . Multiple Vitamin (MULTIVITAMIN) tablet Take 1 tablet by mouth daily.      . naproxen sodium (ANAPROX) 220 MG tablet Take 220 mg by mouth 2 (two) times daily with a meal.      . pantoprazole (PROTONIX) 40 MG tablet Take 40 mg by mouth daily.      . peg 3350 powder (MOVIPREP) 100 G SOLR Take 1 kit (100 g total) by mouth once.  1 kit  0  . Senna (NATURAL VEGETABLE LAXATIVE) 65-325 MG TABS Take 1 tablet by mouth 2 (two) times daily.       Marland Kitchen tiZANidine (ZANAFLEX) 4 MG tablet Take 1 tablet (4 mg total) by mouth every 6 (six) hours as needed.  30 tablet  0  . vitamin C (ASCORBIC ACID) 500 MG tablet Take 1,000 mg by mouth daily.        No current facility-administered medications on file prior to visit.   No Known Allergies  Family History  Problem Relation Age of Onset  . Arthritis Mother   . High blood pressure Mother   . High blood  pressure Sister   . Diabetes Mother    BP 138/70  Ht 5\' 2"  (1.575 m)  Wt 262 lb 8 oz (119.069 kg)  BMI 48 kg/m2  LMP 08/05/2010  Review of Systems denies hypoglycemia and weight change    Objective:   Physical Exam VITAL SIGNS:  See vs page GENERAL: no distress SKIN:  Insulin injection sites at the anterior abdomen are normal     Assessment & Plan:  DM: This insulin regimen was chosen from multiple options, as it best matches his insulin to his changing requirements throughout the day.  The benefits of glycemic control must be weighed against the risks of hypoglycemia.  She needs increased rx

## 2013-01-24 NOTE — Patient Instructions (Addendum)
check your blood sugar twice a day.  vary the time of day when you check, between before the 3 meals, and at bedtime.  also check if you have symptoms of your blood sugar being too high or too low.  please keep a record of the readings and bring it to your next appointment here.  please call us sooner if your blood sugar goes below 70, or if you have a lot of readings over 200. Please increase novolog to 25 units 3 times a day (just before each meal).   Please come back for a follow-up appointment in 2 months.

## 2013-01-29 ENCOUNTER — Encounter: Payer: Medicare Other | Admitting: Internal Medicine

## 2013-01-29 ENCOUNTER — Telehealth: Payer: Self-pay | Admitting: Internal Medicine

## 2013-01-29 NOTE — Telephone Encounter (Signed)
Called pt rescheduled colonoscopy for 02/08/2013 and told her I will leave her a free prep at our front desk. Pt was appreciative.

## 2013-02-07 ENCOUNTER — Telehealth: Payer: Self-pay | Admitting: Internal Medicine

## 2013-02-07 ENCOUNTER — Telehealth: Payer: Self-pay | Admitting: *Deleted

## 2013-02-07 NOTE — Telephone Encounter (Signed)
Attempted to call patient and no one answered.  I left a message for her to call me back immediately due to her low blood sugar.Marland Kitchen54!  Called another number and the patient answered.  She had drunk a coke and she states that she feels a lot better.  I told her to be sure that she drank sugared beverages tonight due to that blood sugar.  She states that she'll keep an eye on it.  I told her that we would rather have her a little too sweet than have a low blood sugar.

## 2013-02-08 ENCOUNTER — Other Ambulatory Visit (INDEPENDENT_AMBULATORY_CARE_PROVIDER_SITE_OTHER): Payer: Medicare Other

## 2013-02-08 ENCOUNTER — Telehealth: Payer: Self-pay | Admitting: *Deleted

## 2013-02-08 ENCOUNTER — Ambulatory Visit (AMBULATORY_SURGERY_CENTER): Payer: Medicare Other | Admitting: Internal Medicine

## 2013-02-08 ENCOUNTER — Encounter: Payer: Self-pay | Admitting: Internal Medicine

## 2013-02-08 VITALS — BP 142/81 | HR 91 | Temp 96.9°F | Resp 16 | Ht 62.0 in | Wt 254.0 lb

## 2013-02-08 DIAGNOSIS — D649 Anemia, unspecified: Secondary | ICD-10-CM

## 2013-02-08 DIAGNOSIS — D509 Iron deficiency anemia, unspecified: Secondary | ICD-10-CM

## 2013-02-08 DIAGNOSIS — Z1211 Encounter for screening for malignant neoplasm of colon: Secondary | ICD-10-CM

## 2013-02-08 DIAGNOSIS — K629 Disease of anus and rectum, unspecified: Secondary | ICD-10-CM

## 2013-02-08 LAB — GLUCOSE, CAPILLARY
Glucose-Capillary: 106 mg/dL — ABNORMAL HIGH (ref 70–99)
Glucose-Capillary: 68 mg/dL — ABNORMAL LOW (ref 70–99)
Glucose-Capillary: 146 mg/dL — ABNORMAL HIGH (ref 70–99)

## 2013-02-08 LAB — IBC PANEL
Iron: 28 ug/dL — ABNORMAL LOW (ref 42–145)
Saturation Ratios: 9.9 % — ABNORMAL LOW (ref 20.0–50.0)
Transferrin: 202 mg/dL — ABNORMAL LOW (ref 212.0–360.0)

## 2013-02-08 LAB — FERRITIN: Ferritin: 70 ng/mL (ref 10.0–291.0)

## 2013-02-08 LAB — CBC
HCT: 29.8 % — ABNORMAL LOW (ref 36.0–46.0)
Hemoglobin: 9.5 g/dL — ABNORMAL LOW (ref 12.0–15.0)
MCHC: 31.8 g/dL (ref 30.0–36.0)
MCV: 77 fl — ABNORMAL LOW (ref 78.0–100.0)
Platelets: 342 10*3/uL (ref 150.0–400.0)
RBC: 3.87 Mil/uL (ref 3.87–5.11)
RDW: 16.8 % — ABNORMAL HIGH (ref 11.5–14.6)
WBC: 5.5 10*3/uL (ref 4.5–10.5)

## 2013-02-08 MED ORDER — DEXTROSE 5 % IV SOLN: INTRAVENOUS | Status: DC

## 2013-02-08 NOTE — Progress Notes (Signed)
Patient did not experience any of the following events: a burn prior to discharge; a fall within the facility; wrong site/side/patient/procedure/implant event; or a hospital transfer or hospital admission upon discharge from the facility. (G8907) Patient did not have preoperative order for IV antibiotic SSI prophylaxis. (G8918)  

## 2013-02-08 NOTE — Progress Notes (Signed)
PT. CBG 68 NO S/S of hypoglycemia pt. Denies feeling bad. d5w administered, CBG rechecked=106. Room staff will be informed.

## 2013-02-08 NOTE — Telephone Encounter (Signed)
lmom for Carla Jensen at [facility] to call back with an appt with Dr Michaell Cowing. Will review lab results as listed below at that time.

## 2013-02-08 NOTE — Patient Instructions (Signed)
YOU HAD AN ENDOSCOPIC PROCEDURE TODAY AT THE [facility]: Refer to the procedure report that was given to you for any specific questions about what was found during the examination.  If the procedure report does not answer your questions, please call your gastroenterologist to clarify.  If you requested that your care partner not be given the details of your procedure findings, then the procedure report has been included in a sealed envelope for you to review at your convenience later.  YOU SHOULD EXPECT: Some feelings of bloating in the abdomen. Passage of more gas than usual.  Walking can help get rid of the air that was put into your GI tract during the procedure and reduce the bloating. If you had a lower endoscopy (such as a colonoscopy or flexible sigmoidoscopy) you may notice spotting of blood in your stool or on the toilet paper. If you underwent a bowel prep for your procedure, then you may not have a normal bowel movement for a few days.  DIET: Your first meal following the procedure should be a light meal and then it is ok to progress to your normal diet.  A half-sandwich or bowl of soup is an example of a good first meal.  Heavy or fried foods are harder to digest and may make you feel nauseous or bloated.  Likewise meals heavy in dairy and vegetables can cause extra gas to form and this can also increase the bloating.  Drink plenty of fluids but you should avoid alcoholic beverages for 24 hours.  ACTIVITY: Your care partner should take you home directly after the procedure.  You should plan to take it easy, moving slowly for the rest of the day.  You can resume normal activity the day after the procedure however you should NOT DRIVE or use heavy machinery for 24 hours (because of the sedation medicines used during the test).    SYMPTOMS TO REPORT IMMEDIATELY: A gastroenterologist can be reached at any hour.  During normal business hours, 8:30 AM to 5:00 PM Monday through Friday,  call [phone].  After hours and on weekends, please call the GI answering service at [phone] who will take a message and have the physician on call contact you.   Following lower endoscopy (colonoscopy or flexible sigmoidoscopy):  Excessive amounts of blood in the stool  Significant tenderness or worsening of abdominal pains  Swelling of the abdomen that is new, acute  Fever of 100F or higher    FOLLOW UP: If any biopsies were taken you will be contacted by phone or by letter within the next 1-3 weeks.  Call your gastroenterologist if you have not heard about the biopsies in 3 weeks.  Our staff will call the home number listed on your records the next business day following your procedure to check on you and address any questions or concerns that you may have at that time regarding the information given to you following your procedure. This is a courtesy call and so if there is no answer at the home number and we have not heard from you through the emergency physician on call, we will assume that you have returned to your regular daily activities without incident.  SIGNATURES/CONFIDENTIALITY: You and/or your care partner have signed paperwork which will be entered into your electronic medical record.  These signatures attest to the fact that that the information above on your After Visit Summary has been reviewed and is understood.  Full responsibility of the confidentiality   of this discharge information lies with you and/or your care-partner.     

## 2013-02-08 NOTE — Op Note (Signed)
[facility] [address] [place], [address]   COLONOSCOPY PROCEDURE REPORT  PATIENT: [name], Carla Jensen  MR#: [id number] BIRTHDATE: 05/04/1961 , 52  yrs. old GENDER: Female ENDOSCOPIST: Beverley Fiedler, MD REFERRED XB:JYNWGN Lowne, DO PROCEDURE DATE:  02/08/2013 PROCEDURE:   Colonoscopy, surveillance First Screening Colonoscopy - Avg.  risk and is 50 yrs.  old or older Yes.  Prior Negative Screening - Now for repeat screening. N/A  History of Adenoma - Now for follow-up colonoscopy & has been > or = to 3 yrs.  N/A  Polyps Removed Today? No.  Recommend repeat exam, <10 yrs? No. ASA CLASS:   Class III INDICATIONS:average risk screening, Iron Deficiency Anemia, and first colonoscopy. MEDICATIONS: MAC sedation, administered by CRNA and propofol (Diprivan) 300mg  IV  DESCRIPTION OF PROCEDURE:   After the risks benefits and alternatives of the procedure were thoroughly explained, informed consent was obtained.  A digital rectal exam revealed no rectal mass.   The LB [id number]  endoscope was introduced through the anus and advanced to the cecum, which was identified by both the appendix and ileocecal valve. No adverse events experienced. The quality of the prep was good, using MoviPrep  The instrument was then slowly withdrawn as the colon was fully examined.   COLON FINDINGS: Mild melanosis was found The finding was in the right colon.   A normal appearing cecum, ileocecal valve, and appendiceal orifice were identified.  The ascending, hepatic flexure, transverse, splenic flexure, descending, sigmoid colon and rectum appeared unremarkable.  No polyps or cancers were seen. Retroflexed views revealed small anal nodule below the dentate line. The time to cecum=2 minutes 31 seconds.  Withdrawal time=9 minutes 51 seconds.  The scope was withdrawn and the procedure completed. COMPLICATIONS: There were no complications.  ENDOSCOPIC IMPRESSION: 1.   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   Normal colon  RECOMMENDATIONS: 1.  Referral to Dr.  Michaell Cowing for examination of small anal lesion seen on retroflexion (below the dentate line) 2.  You should continue to follow colorectal cancer screening guidelines for "routine risk" patients with a repeat colonoscopy in 10 years.  There is no need for FOBT (stool) testing for at least 5 years. 3.  CBC and iron studies today   eSigned:  Beverley Fiedler, MD 02/08/2013 8:52 AM cc: The Patient and Lelon Perla, DO

## 2013-02-08 NOTE — Telephone Encounter (Signed)
Message copied by Florene Glen on Thu Feb 08, 2013  4:19 PM ------      Message from: Beverley Fiedler      Created: Thu Feb 08, 2013  3:51 PM       CBC shows persistent anemia at 9.5, iron studies are mixed picture and most consistent with chronic disease      Transferrin is low and ferritin is normal -- both of which would not be expected in pure iron deficiency anemia      She should discuss this further with her primary provider ------

## 2013-02-09 ENCOUNTER — Telehealth: Payer: Self-pay | Admitting: *Deleted

## 2013-02-09 NOTE — Telephone Encounter (Signed)
Informed pt of lab results and instructions. Also gave her the appt date and time as well as location with Dr Michaell Cowing ; pt stated understanding.

## 2013-02-09 NOTE — Telephone Encounter (Signed)
  Follow up Call-  Call back number 02/08/2013  Post procedure Call Back phone  # [phone]  Permission to leave phone message Yes     Patient questions:  Do you have a fever, pain , or abdominal swelling? no Pain Score  0 *  Have you tolerated food without any problems? yes  Have you been able to return to your normal activities? yes  Do you have any questions about your discharge instructions: Diet   no Medications  no Follow up visit  no  Do you have questions or concerns about your Care? no  Actions: * If pain score is 4 or above: No action needed, pain <4.

## 2013-02-20 NOTE — Telephone Encounter (Signed)
See Previous Encounters

## 2013-02-26 ENCOUNTER — Ambulatory Visit (INDEPENDENT_AMBULATORY_CARE_PROVIDER_SITE_OTHER): Payer: Medicare Other | Admitting: Surgery

## 2013-03-14 ENCOUNTER — Ambulatory Visit (INDEPENDENT_AMBULATORY_CARE_PROVIDER_SITE_OTHER): Payer: Medicare Other | Admitting: Surgery

## 2013-03-23 ENCOUNTER — Telehealth: Payer: Self-pay | Admitting: Family Medicine

## 2013-03-23 MED ORDER — GABAPENTIN 300 MG PO CAPS: 300.0000 mg | ORAL_CAPSULE | Freq: Two times a day (BID) | ORAL | Status: DC

## 2013-03-23 NOTE — Telephone Encounter (Signed)
Please advise if this refill is appropriate. Please advise      KP

## 2013-03-23 NOTE — Telephone Encounter (Signed)
300 mg bid #60  5 refills

## 2013-03-23 NOTE — Telephone Encounter (Signed)
What med is this for?

## 2013-03-23 NOTE — Telephone Encounter (Signed)
Med filled.  

## 2013-03-23 NOTE — Telephone Encounter (Signed)
03/23/2013  Pt came in with husband for his appt.  Requesting refill of Gabapentin.  Please send to [facility] on [address].  Please call her when you have sent.  [phone].  Thank you.  bw

## 2013-03-23 NOTE — Telephone Encounter (Signed)
Gabapentin

## 2013-03-27 ENCOUNTER — Ambulatory Visit: Payer: Medicare Other | Admitting: Endocrinology

## 2013-04-13 ENCOUNTER — Other Ambulatory Visit (INDEPENDENT_AMBULATORY_CARE_PROVIDER_SITE_OTHER): Payer: Medicare Other

## 2013-04-13 ENCOUNTER — Ambulatory Visit (INDEPENDENT_AMBULATORY_CARE_PROVIDER_SITE_OTHER): Payer: Medicare Other | Admitting: Endocrinology

## 2013-04-13 ENCOUNTER — Other Ambulatory Visit: Payer: Self-pay

## 2013-04-13 ENCOUNTER — Encounter: Payer: Self-pay | Admitting: Endocrinology

## 2013-04-13 VITALS — BP 134/80 | HR 80 | Ht 64.0 in | Wt 266.0 lb

## 2013-04-13 DIAGNOSIS — E1059 Type 1 diabetes mellitus with other circulatory complications: Secondary | ICD-10-CM

## 2013-04-13 DIAGNOSIS — E119 Type 2 diabetes mellitus without complications: Secondary | ICD-10-CM

## 2013-04-13 LAB — HEMOGLOBIN A1C: Hgb A1c MFr Bld: 7.8 % — ABNORMAL HIGH (ref 4.6–6.5)

## 2013-04-13 NOTE — Patient Instructions (Addendum)
check your blood sugar twice a day.  vary the time of day when you check, between before the 3 meals, and at bedtime.  also check if you have symptoms of your blood sugar being too high or too low.  please keep a record of the readings and bring it to your next appointment here.  please call us sooner if your blood sugar goes below 70, or if you have a lot of readings over 200. Please change novolog to 3 times a day (just before each meal): 25-30-20 units. Please come back for a follow-up appointment in January. blood tests are being requested for you today.  We'll contact you with results.

## 2013-04-13 NOTE — Progress Notes (Signed)
Subjective:    Patient ID: Carla Jensen, female    DOB: [birth year], 52 y.o.   MRN: [id number]  HPI pt returns for f/u of insulin-requiring DM (dx'ed 2011; she has mild if any neuropathy of the lower extremities; no associated chronic complications).  no cbg record, but states cbg's are lowest in am (50's), and highest in the afternoon (185).  pt states she feels well in general. Past Medical History  Diagnosis Date  . Asthma   . Arthritis   . Depression   . GERD (gastroesophageal reflux disease)   . Allergy   . Hypertension   . Chickenpox   . 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 Diabetes mellitus without complication   . Stroke     Past Surgical History  Procedure Laterality Date  . Knee arthroscopy Left 5/06  . Tendon repair Right 3/07    Elbow  . Achilles tendon repair Left 1/08    Dr.Norris  . Achilles tendon repair Right 7/09    Dr.Norris  . Elbow surgery      History   Social History  . Marital Status: Married    Spouse Name: N/A    Number of Children: N/A  . Years of Education: N/A   Occupational History  . Not on file.   Social History Main Topics  . Smoking status: Never Smoker   . Smokeless tobacco: Never Used  . Alcohol Use: No  . Drug Use: No  . Sexual Activity: Not on file   Other Topics Concern  . Not on file   Social History Narrative  . No narrative on file    Current Outpatient Prescriptions on File Prior to Visit  Medication Sig Dispense Refill  . albuterol (PROAIR HFA) 108 (90 BASE) MCG/ACT inhaler Inhale 2 puffs into the lungs every 6 (six) hours as needed for wheezing.  1 Inhaler  0  . albuterol (PROVENTIL) (2.5 MG/3ML) 0.083% nebulizer solution Take 3 mLs (2.5 mg total) by nebulization every 6 (six) hours as needed for wheezing.  75 mL  2  . aspirin 81 MG tablet Take 81 mg by mouth daily.      . beclomethasone (QVAR) 40 MCG/ACT inhaler Inhale 2 puffs into the lungs 2 (two) times daily.  1  Inhaler  12  . Blood Glucose Monitoring Suppl (ONE TOUCH ULTRA SYSTEM KIT) W/DEVICE KIT 1 kit by Does not apply route once. Dx. 250.00  1 each  0  . CINNAMON PO Take 1,000 mg by mouth daily.      Marland Kitchen diltiazem (DILACOR XR) 120 MG 24 hr capsule Take 1 capsule (120 mg total) by mouth daily.  90 capsule  3  . ferrous sulfate 325 (65 FE) MG tablet Take 650 mg by mouth daily with breakfast.       . gabapentin (NEURONTIN) 300 MG capsule Take 1 capsule (300 mg total) by mouth 2 (two) times daily.  60 capsule  5  . glucose blood test strip Use as instructed. Pt tests sugars once daily. Dx. 250.00  100 each  12  . Lancets Thin MISC Use as directed. Pt tests sugars once daily. Dx. 250.00  100 each  12  . Multiple Vitamin (MULTIVITAMIN) tablet Take 1 tablet by mouth daily.      . naproxen sodium (ANAPROX) 220 MG tablet Take 220 mg by mouth 2 (two) times daily with a meal.      . pantoprazole (PROTONIX) 40 MG tablet Take 40 mg  by mouth daily.      . Senna (NATURAL VEGETABLE LAXATIVE) 65-325 MG TABS Take 1 tablet by mouth 2 (two) times daily.       Marland Kitchen tiZANidine (ZANAFLEX) 4 MG tablet Take 1 tablet (4 mg total) by mouth every 6 (six) hours as needed.  30 tablet  0  . vitamin C (ASCORBIC ACID) 500 MG tablet Take 1,000 mg by mouth daily.        No current facility-administered medications on file prior to visit.    No Known Allergies  Family History  Problem Relation Age of Onset  . Arthritis Mother   . High blood pressure Mother   . Diabetes Mother   . High blood pressure Sister   . High blood pressure Father     BP 134/80  Pulse 80  Ht 5\' 4"  (1.626 m)  Wt 266 lb (120.657 kg)  BMI 45.64 kg/m2  SpO2 98%  LMP 08/05/2010   Review of Systems Denies LOC and weight change.    Objective:   Physical Exam VITAL SIGNS:  See vs page GENERAL: no distress.  Obese. Lab Results  Component Value Date   HGBA1C 7.8* 04/13/2013      Assessment & Plan:  DM: This insulin regimen was chosen from multiple  options, as it best matches his insulin to his changing requirements throughout the day.  The benefits of glycemic control must be weighed against the risks of hypoglycemia.  She needs increased rx. Obesity: this complicates the rx of DM.

## 2013-04-25 ENCOUNTER — Other Ambulatory Visit: Payer: Self-pay | Admitting: Family Medicine

## 2013-04-29 ENCOUNTER — Other Ambulatory Visit: Payer: Self-pay | Admitting: Family Medicine

## 2013-07-25 ENCOUNTER — Other Ambulatory Visit: Payer: Self-pay | Admitting: Family Medicine

## 2013-07-26 ENCOUNTER — Other Ambulatory Visit: Payer: Self-pay | Admitting: Family Medicine

## 2013-08-25 ENCOUNTER — Other Ambulatory Visit: Payer: Self-pay | Admitting: Family Medicine

## 2013-09-21 ENCOUNTER — Other Ambulatory Visit: Payer: Self-pay | Admitting: Family Medicine

## 2013-09-24 ENCOUNTER — Other Ambulatory Visit: Payer: Self-pay

## 2013-09-24 MED ORDER — GABAPENTIN 300 MG PO CAPS: 300.0000 mg | ORAL_CAPSULE | Freq: Two times a day (BID) | ORAL | Status: DC

## 2013-10-06 ENCOUNTER — Other Ambulatory Visit: Payer: Self-pay | Admitting: Family Medicine

## 2013-10-19 ENCOUNTER — Ambulatory Visit (INDEPENDENT_AMBULATORY_CARE_PROVIDER_SITE_OTHER): Payer: Managed Care, Other (non HMO) | Admitting: Family Medicine

## 2013-10-19 ENCOUNTER — Telehealth: Payer: Self-pay | Admitting: Family Medicine

## 2013-10-19 ENCOUNTER — Other Ambulatory Visit: Payer: Self-pay | Admitting: *Deleted

## 2013-10-19 ENCOUNTER — Encounter: Payer: Self-pay | Admitting: Family Medicine

## 2013-10-19 VITALS — BP 118/60 | HR 108 | Temp 98.6°F | Wt 264.0 lb

## 2013-10-19 DIAGNOSIS — D649 Anemia, unspecified: Secondary | ICD-10-CM

## 2013-10-19 DIAGNOSIS — I10 Essential (primary) hypertension: Secondary | ICD-10-CM

## 2013-10-19 DIAGNOSIS — Z23 Encounter for immunization: Secondary | ICD-10-CM

## 2013-10-19 DIAGNOSIS — E669 Obesity, unspecified: Secondary | ICD-10-CM | POA: Insufficient documentation

## 2013-10-19 DIAGNOSIS — M79609 Pain in unspecified limb: Secondary | ICD-10-CM

## 2013-10-19 DIAGNOSIS — D509 Iron deficiency anemia, unspecified: Secondary | ICD-10-CM

## 2013-10-19 DIAGNOSIS — M79644 Pain in right finger(s): Secondary | ICD-10-CM

## 2013-10-19 LAB — HEPATIC FUNCTION PANEL
ALT: 38 U/L — ABNORMAL HIGH (ref 0–35)
AST: 36 U/L (ref 0–37)
Albumin: 3.8 g/dL (ref 3.5–5.2)
Alkaline Phosphatase: 103 U/L (ref 39–117)
Bilirubin, Direct: 0 mg/dL (ref 0.0–0.3)
Total Bilirubin: 0.2 mg/dL — ABNORMAL LOW (ref 0.3–1.2)
Total Protein: 7.8 g/dL (ref 6.0–8.3)

## 2013-10-19 LAB — CBC WITH DIFFERENTIAL/PLATELET
Basophils Absolute: 0 10*3/uL (ref 0.0–0.1)
Basophils Relative: 0.1 % (ref 0.0–3.0)
Eosinophils Absolute: 0.1 10*3/uL (ref 0.0–0.7)
Eosinophils Relative: 0.9 % (ref 0.0–5.0)
HCT: 31.8 % — ABNORMAL LOW (ref 36.0–46.0)
Hemoglobin: 10.1 g/dL — ABNORMAL LOW (ref 12.0–15.0)
Lymphocytes Relative: 27.3 % (ref 12.0–46.0)
Lymphs Abs: 1.5 10*3/uL (ref 0.7–4.0)
MCHC: 31.6 g/dL (ref 30.0–36.0)
MCV: 74.8 fl — ABNORMAL LOW (ref 78.0–100.0)
Monocytes Absolute: 0.6 10*3/uL (ref 0.1–1.0)
Monocytes Relative: 9.9 % (ref 3.0–12.0)
Neutro Abs: 3.5 10*3/uL (ref 1.4–7.7)
Neutrophils Relative %: 61.8 % (ref 43.0–77.0)
Platelets: 331 10*3/uL (ref 150.0–400.0)
RBC: 4.26 Mil/uL (ref 3.87–5.11)
RDW: 18.1 % — ABNORMAL HIGH (ref 11.5–14.6)
WBC: 5.6 10*3/uL (ref 4.5–10.5)

## 2013-10-19 LAB — LIPID PANEL
Cholesterol: 137 mg/dL (ref 0–200)
HDL: 47.2 mg/dL (ref >39.00)
LDL Cholesterol: 72 mg/dL (ref 0–99)
Total CHOL/HDL Ratio: 3
Triglycerides: 90 mg/dL (ref 0.0–149.0)
VLDL: 18 mg/dL (ref 0.0–40.0)

## 2013-10-19 LAB — URIC ACID: Uric Acid, Serum: 7.3 mg/dL — ABNORMAL HIGH (ref 2.4–7.0)

## 2013-10-19 LAB — IBC PANEL
Iron: 51 ug/dL (ref 42–145)
Saturation Ratios: 17.6 % — ABNORMAL LOW (ref 20.0–50.0)
Transferrin: 206.9 mg/dL — ABNORMAL LOW (ref 212.0–360.0)

## 2013-10-19 MED ORDER — QUINAPRIL-HYDROCHLOROTHIAZIDE 20-25 MG PO TABS: ORAL_TABLET | ORAL | Status: DC

## 2013-10-19 MED ORDER — DILTIAZEM HCL ER 120 MG PO CP24: 120.0000 mg | ORAL_CAPSULE | Freq: Every day | ORAL | Status: DC

## 2013-10-19 MED ORDER — GABAPENTIN 300 MG PO CAPS: 300.0000 mg | ORAL_CAPSULE | Freq: Two times a day (BID) | ORAL | Status: DC

## 2013-10-19 MED ORDER — IRON 66 MG PO TABS: ORAL_TABLET | ORAL | Status: DC

## 2013-10-19 MED ORDER — PANTOPRAZOLE SODIUM 40 MG PO TBEC: 40.0000 mg | DELAYED_RELEASE_TABLET | Freq: Every day | ORAL | Status: DC

## 2013-10-19 MED ORDER — ALLOPURINOL 100 MG PO TABS: 100.0000 mg | ORAL_TABLET | Freq: Every day | ORAL | Status: DC

## 2013-10-19 NOTE — Progress Notes (Signed)
Patient returned call and agreed to starting allopurinol and 2 tabs of iron. Medication (allopurinol) e-scribed to patient. JG//CMA

## 2013-10-19 NOTE — Progress Notes (Signed)
Pre visit review using our clinic review tool, if applicable. No additional management support is needed unless otherwise documented below in the visit note. 

## 2013-10-19 NOTE — Progress Notes (Signed)
Patient currently takes 2 tabs of iron daily. Hematology referral placed dx anemia. JG//CMA

## 2013-10-19 NOTE — Telephone Encounter (Signed)
Relevant patient education assigned to patient using Emmi. ° °

## 2013-10-19 NOTE — Patient Instructions (Signed)
Iron Deficiency Anemia, Adult  Anemia is a condition in which there are less red blood cells or hemoglobin in the blood than normal. Hemoglobin is this part of red blood cells that carries oxygen. Iron deficiency anemia is anemia caused by too little iron. It is the most common type of anemia. It may leave you tired and short of breath.  CAUSES    Lack of iron in the diet.   Poor absorption of iron, as seen with intestinal disorders.   Intestinal bleeding.   Heavy periods.  SIGNS AND SYMPTOMS   Mild anemia may not be noticeable. Symptoms may include:   Fatigue.   Headache.   Pale skin.   Weakness.   Tiredness.   Shortness of breath.   Dizziness.   Cold hands and feet.   Fast or irregular heartbeat.  DIAGNOSIS   Diagnosis requires a thorough evaluation and physical exam by your health care provider. Blood tests are generally used to confirm iron deficiency anemia. Additional tests may be done to find the underlying cause of your anemia. These may include:   Testing for blood in the stool (fecal occult blood test).   A procedure to see inside the colon and rectum (colonoscopy).   A procedure to see inside the esophagus and stomach (endoscopy).  TREATMENT   Iron deficiency anemia is treated by correcting the cause of the deficiency. Treatment may involve:   Adding iron-rich foods to your diet.   Taking iron supplements. Pregnant or breastfeeding women need to take extra iron, because their normal diet usually does not provide the required amount.   Taking vitamins. Vitamin C improves the absorption of iron. Your health care provider may recommend taking your iron tablets with a glass of orange juice or vitamin C supplement.   Medicines to make heavy menstrual flow lighter.   Surgery.  HOME CARE INSTRUCTIONS    Take iron as directed by your health care provider.   If you cannot tolerate taking iron supplements by mouth, talk to your health care provider about taking them through a vein  (intravenously) or an injection into a muscle.   For the best iron absorption, iron supplements should be taken on an empty stomach. If you cannot tolerate them on an empty stomach, you may need to take them with food.   Do not drink milk or take antacids at the same time as your iron supplements. Milk and antacids may interfere with the absorption of iron.   Iron supplements can cause constipation. Make sure to include fiber in your diet to prevent constipation. A stool softener may also be recommended.   Take vitamins as directed by your health care provider.   Eat a diet rich in iron. Foods high in iron include liver, lean beef, whole-grain bread, eggs, dried fruit, and dark green, leafy vegetables.  SEEK IMMEDIATE MEDICAL CARE IF:    You faint. If this happens, do not drive. Call your local emergency services (911 in [place] if no other help is available.   You have chest pain.   You feel nauseous or vomit.   You have severe or increased shortness of breath with activity.   You feel weak.   You have a rapid heartbeat.   You have unexplained sweating.   You become lightheaded when getting up from a chair or bed.  MAKE SURE YOU:    Understand these instructions.   Will watch your condition.   Will get help right away if you are not 

## 2013-10-19 NOTE — Progress Notes (Signed)
   Subjective:    Patient ID: Carla Jensen, female    DOB: March 30, 1961, 53 y.o.   MRN: [id number]  Hypertension  Diabetes   HPI HYPERTENSION  Blood pressure range-good  Chest pain- no      Dyspnea- no Lightheadedness- no   Edema- no Other side effects - no   Medication compliance: good Low salt diet- yes  DIABETES  Blood Sugar ranges-good  Polyuria- no New Visual problems- no Hypoglycemic symptoms- no Other side effects-no Medication compliance - good Last eye exam- due Foot exam- 04/2013    PMH Smoking Status noted        Review of Systems As above    Objective:   Physical Exam  Constitutional: She is oriented to person, place, and time. She appears well-developed and well-nourished.  HENT:  Head: Normocephalic and atraumatic.  Eyes: Conjunctivae and EOM are normal.  Neck: Normal range of motion. Neck supple. No JVD present. Carotid bruit is not present. No thyromegaly present.  Cardiovascular: Normal rate, regular rhythm and normal heart sounds.   No murmur heard. Pulmonary/Chest: Effort normal and breath sounds normal. No respiratory distress. She has no wheezes. She has no rales. She exhibits no tenderness.  Musculoskeletal:  + pain and min swelling in R index finger--dip, pip  Neurological: She is alert and oriented to person, place, and time.  Psychiatric: She has a normal mood and affect.          Assessment & Plan:  1. HTN (hypertension) stable - diltiazem (DILACOR XR) 120 MG 24 hr capsule; Take 1 capsule (120 mg total) by mouth daily.  Dispense: 30 capsule; Refill: 5 - Hepatic function panel - Lipid panel  2. Anemia, iron deficiency Check labs--if still low--- refer to hematology - CBC with Differential - Ferritin; Future - IBC panel - Iron 66 MG TABS; 2 po qd; Refill: 0  3. Pain in finger of right hand Pain and swelling in R index finger - Uric acid  4. DM- per endo

## 2013-11-16 ENCOUNTER — Ambulatory Visit: Payer: Managed Care, Other (non HMO)

## 2013-11-16 ENCOUNTER — Other Ambulatory Visit: Payer: Self-pay | Admitting: Internal Medicine

## 2013-11-16 ENCOUNTER — Other Ambulatory Visit: Payer: Managed Care, Other (non HMO)

## 2013-11-16 DIAGNOSIS — D509 Iron deficiency anemia, unspecified: Secondary | ICD-10-CM

## 2014-01-07 ENCOUNTER — Emergency Department (HOSPITAL_COMMUNITY)
Admission: EM | Admit: 2014-01-07 | Discharge: 2014-01-07 | Disposition: A | Discharge status: Home / Self Care | Payer: Medicare HMO | Attending: Emergency Medicine | Admitting: Emergency Medicine

## 2014-01-07 ENCOUNTER — Emergency Department (HOSPITAL_COMMUNITY): Payer: Medicare HMO

## 2014-01-07 ENCOUNTER — Encounter (HOSPITAL_COMMUNITY): Payer: Self-pay | Admitting: Emergency Medicine

## 2014-01-07 DIAGNOSIS — I10 Essential (primary) hypertension: Secondary | ICD-10-CM | POA: Insufficient documentation

## 2014-01-07 DIAGNOSIS — K219 Gastro-esophageal reflux disease without esophagitis: Secondary | ICD-10-CM | POA: Insufficient documentation

## 2014-01-07 DIAGNOSIS — F329 Major depressive disorder, single episode, unspecified: Secondary | ICD-10-CM | POA: Insufficient documentation

## 2014-01-07 DIAGNOSIS — J069 Acute upper respiratory infection, unspecified: Secondary | ICD-10-CM

## 2014-01-07 DIAGNOSIS — Z8673 Personal history of transient ischemic attack (TIA), and cerebral infarction without residual deficits: Secondary | ICD-10-CM | POA: Insufficient documentation

## 2014-01-07 DIAGNOSIS — Z791 Long term (current) use of non-steroidal anti-inflammatories (NSAID): Secondary | ICD-10-CM | POA: Insufficient documentation

## 2014-01-07 DIAGNOSIS — Z7982 Long term (current) use of aspirin: Secondary | ICD-10-CM | POA: Insufficient documentation

## 2014-01-07 DIAGNOSIS — F3289 Other specified depressive episodes: Secondary | ICD-10-CM | POA: Insufficient documentation

## 2014-01-07 DIAGNOSIS — M129 Arthropathy, unspecified: Secondary | ICD-10-CM | POA: Insufficient documentation

## 2014-01-07 DIAGNOSIS — Z79899 Other long term (current) drug therapy: Secondary | ICD-10-CM | POA: Insufficient documentation

## 2014-01-07 DIAGNOSIS — J45901 Unspecified asthma with (acute) exacerbation: Secondary | ICD-10-CM | POA: Insufficient documentation

## 2014-01-07 DIAGNOSIS — T148XXA Other injury of unspecified body region, initial encounter: Secondary | ICD-10-CM

## 2014-01-07 DIAGNOSIS — Z8619 Personal history of other infectious and parasitic diseases: Secondary | ICD-10-CM | POA: Insufficient documentation

## 2014-01-07 DIAGNOSIS — E669 Obesity, unspecified: Secondary | ICD-10-CM | POA: Insufficient documentation

## 2014-01-07 DIAGNOSIS — E119 Type 2 diabetes mellitus without complications: Secondary | ICD-10-CM | POA: Insufficient documentation

## 2014-01-07 DIAGNOSIS — I998 Other disorder of circulatory system: Secondary | ICD-10-CM | POA: Insufficient documentation

## 2014-01-07 DIAGNOSIS — IMO0002 Reserved for concepts with insufficient information to code with codable children: Secondary | ICD-10-CM | POA: Insufficient documentation

## 2014-01-07 DIAGNOSIS — Z794 Long term (current) use of insulin: Secondary | ICD-10-CM | POA: Insufficient documentation

## 2014-01-07 LAB — BASIC METABOLIC PANEL
BUN: 12 mg/dL (ref 6–23)
CO2: 26 mEq/L (ref 19–32)
Calcium: 9.5 mg/dL (ref 8.4–10.5)
Chloride: 101 mEq/L (ref 96–112)
Creatinine, Ser: 0.97 mg/dL (ref 0.50–1.10)
GFR calc Af Amer: 75 mL/min — ABNORMAL LOW (ref >90)
GFR calc non Af Amer: 65 mL/min — ABNORMAL LOW (ref >90)
Glucose, Bld: 131 mg/dL — ABNORMAL HIGH (ref 70–99)
Potassium: 3.9 mEq/L (ref 3.7–5.3)
Sodium: 141 mEq/L (ref 137–147)

## 2014-01-07 LAB — CBC
HCT: 30.7 % — ABNORMAL LOW (ref 36.0–46.0)
Hemoglobin: 9.4 g/dL — ABNORMAL LOW (ref 12.0–15.0)
MCH: 23.6 pg — ABNORMAL LOW (ref 26.0–34.0)
MCHC: 30.6 g/dL (ref 30.0–36.0)
MCV: 77.1 fL — ABNORMAL LOW (ref 78.0–100.0)
Platelets: 338 10*3/uL (ref 150–400)
RBC: 3.98 MIL/uL (ref 3.87–5.11)
RDW: 16.6 % — ABNORMAL HIGH (ref 11.5–15.5)
WBC: 7.1 10*3/uL (ref 4.0–10.5)

## 2014-01-07 LAB — D-DIMER, QUANTITATIVE: D-Dimer, Quant: 0.53 ug/mL-FEU — ABNORMAL HIGH (ref 0.00–0.48)

## 2014-01-07 MED ORDER — IOHEXOL 350 MG/ML SOLN
80.0000 mL | Freq: Once | INTRAVENOUS | Status: AC | PRN
Start: 2014-01-07 — End: 2014-01-07
  Administered 2014-01-07: 51 mL via INTRAVENOUS

## 2014-01-07 NOTE — ED Notes (Signed)
Pt A&OX4, ambulatory at d/c with steady gait, NAD, declined wheelchair. 

## 2014-01-07 NOTE — Discharge Instructions (Signed)
Your chest CT Scan is normal

## 2014-01-07 NOTE — ED Notes (Signed)
Patient with bruise on left lateral knee.  Patient states she does not remember injuring the area.  Patient does not have any swelling to the left leg.  Patient states that she is worried about a blood clot.  Patient also has a cough and some shortness of breath.

## 2014-01-07 NOTE — ED Notes (Signed)
Patient transported to CT 

## 2014-01-07 NOTE — ED Provider Notes (Signed)
CSN: [id number]     Arrival date & time 01/07/14  1950 History   First MD Initiated Contact with Patient 01/07/14 2052     Chief Complaint  Patient presents with  . Leg Pain     (Consider location/radiation/quality/duration/timing/severity/associated sxs/prior Treatment) HPI Comments: Patient noticed a bruise on the lateral aspect of her L knee several days ago than yesterday started with URI symptoms  Denies fever, has not gotten relief from her inhaler, has not contacted her PCP   Patient is a 53 y.o. female presenting with leg pain. The history is provided by the patient.  Leg Pain Location:  Knee Injury: yes   Mechanism of injury comment:  Unknown Knee location:  L knee Pain details:    Quality:  Aching   Radiates to:  Does not radiate   Severity:  Mild   Onset quality:  Unable to specify   Timing:  Constant   Progression:  Unchanged Chronicity:  New Dislocation: no   Foreign body present:  Unable to specify Tetanus status:  Unknown Prior injury to area:  No Relieved by:  None tried Worsened by:  Nothing tried Ineffective treatments:  None tried Associated symptoms: swelling   Associated symptoms: no decreased ROM and no fever     Past Medical History  Diagnosis Date  . Asthma   . Arthritis   . Depression   . GERD (gastroesophageal reflux disease)   . Allergy   . Hypertension   . Chickenpox   . 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 Diabetes mellitus without complication   . Stroke    Past Surgical History  Procedure Laterality Date  . Knee arthroscopy Left 5/06  . Tendon repair Right 3/07    Elbow  . Achilles tendon repair Left 1/08    Dr.Norris  . Achilles tendon repair Right 7/09    Dr.Norris  . Elbow surgery     Family History  Problem Relation Age of Onset  . Arthritis Mother   . High blood pressure Mother   . Diabetes Mother   . High blood pressure Sister   . High blood pressure Father    History    Substance Use Topics  . Smoking status: Never Smoker   . Smokeless tobacco: Never Used  . Alcohol Use: No   OB History   Grav Para Term Preterm Abortions TAB SAB Ect Mult Living                 Review of Systems  Constitutional: Negative for fever and chills.  Respiratory: Positive for cough and shortness of breath.   Cardiovascular: Negative for leg swelling.  Musculoskeletal: Negative for myalgias.  Skin: Positive for wound.      Allergies  Review of patient's allergies indicates no known allergies.  Home Medications   Prior to Admission medications   Medication Sig Start Date End Date Taking? Authorizing Provider  albuterol (PROAIR HFA) 108 (90 BASE) MCG/ACT inhaler Inhale 2 puffs into the lungs every 6 (six) hours as needed for wheezing. 11/29/12  Yes Alferd Apa Lowne, DO  albuterol (PROVENTIL) (2.5 MG/3ML) 0.083% nebulizer solution Take 3 mLs (2.5 mg total) by nebulization every 6 (six) hours as needed for wheezing. 11/29/12  Yes Rosalita Chessman, DO  aspirin 81 MG tablet Take 81 mg by mouth daily.   Yes Historical Provider, MD  beclomethasone (QVAR) 40 MCG/ACT inhaler Inhale 2 puffs into the lungs 2 (two) times daily. 11/29/12  Yes Kendrick Fries  R Lowne, DO  CINNAMON PO Take 1,000 mg by mouth daily.   Yes Historical Provider, MD  diltiazem (DILACOR XR) 120 MG 24 hr capsule Take 1 capsule (120 mg total) by mouth daily. 10/19/13  Yes Yvonne R Lowne, DO  ferrous sulfate 325 (65 FE) MG tablet Take 650 mg by mouth daily with breakfast.    Yes Historical Provider, MD  gabapentin (NEURONTIN) 300 MG capsule Take 1 capsule (300 mg total) by mouth 2 (two) times daily. 10/19/13  Yes Rosalita Chessman, DO  insulin aspart (NOVOLOG) 100 UNIT/ML SOPN FlexPen 3 times a day (just before each meal), 25-30-20 units, and pen needles 3/day 01/24/13  Yes Renato Shin, MD  Multiple Vitamin (MULTIVITAMIN) tablet Take 1 tablet by mouth daily.   Yes Historical Provider, MD  naproxen sodium (ANAPROX) 220 MG tablet  Take 220 mg by mouth daily as needed. For pain   Yes Historical Provider, MD  pantoprazole (PROTONIX) 40 MG tablet Take 1 tablet (40 mg total) by mouth daily. 10/19/13  Yes Yvonne R Lowne, DO  quinapril-hydrochlorothiazide (ACCURETIC) 20-25 MG per tablet Take 1 tablet by mouth daily. 1 tab by mouth daily 10/19/13  Yes Rosalita Chessman, DO  Senna (NATURAL VEGETABLE LAXATIVE) 65-325 MG TABS Take 1 tablet by mouth 2 (two) times daily.    Yes Historical Provider, MD  tiZANidine (ZANAFLEX) 4 MG tablet Take 4 mg by mouth every 6 (six) hours as needed for muscle spasms. 12/11/12  Yes Rosalita Chessman, DO  vitamin C (ASCORBIC ACID) 500 MG tablet Take 1,000 mg by mouth daily.    Yes Historical Provider, MD  Blood Glucose Monitoring Suppl (ONE TOUCH ULTRA SYSTEM KIT) W/DEVICE KIT 1 kit by Does not apply route once. Dx. 250.00 11/30/12   Alferd Apa Lowne, DO  glucose blood test strip Use as instructed. Pt tests sugars once daily. Dx. 250.00 11/30/12   Rosalita Chessman, DO  Lancets Thin MISC Use as directed. Pt tests sugars once daily. Dx. 250.00 11/30/12   Alferd Apa Lowne, DO   BP 145/75  Pulse 82  Temp(Src) 97.9 F (36.6 C) (Oral)  Resp 22  Ht $R'5\' 3"'ka$  (1.6 m)  Wt 269 lb (122.018 kg)  BMI 47.66 kg/m2  SpO2 100%  LMP 08/05/2010 Physical Exam  Nursing note and vitals reviewed. Constitutional: She is oriented to person, place, and time. She appears well-developed and well-nourished.  obese  HENT:  Head: Normocephalic and atraumatic.  Eyes: Pupils are equal, round, and reactive to light.  Neck: Normal range of motion.  Cardiovascular: Normal rate.   Pulmonary/Chest: Effort normal and breath sounds normal. No respiratory distress. She has no wheezes. She exhibits no tenderness.  Abdominal: Soft.  Musculoskeletal: Normal range of motion. She exhibits tenderness. She exhibits no edema.       Left knee: She exhibits ecchymosis. She exhibits normal range of motion, no swelling, no deformity, no laceration, no erythema,  normal alignment and no LCL laxity. No tenderness found.       Legs: Neurological: She is alert and oriented to person, place, and time.  Skin: Skin is warm.    ED Course  Procedures (including critical care time) Labs Review Labs Reviewed  CBC - Abnormal; Notable for the following:    Hemoglobin 9.4 (*)    HCT 30.7 (*)    MCV 77.1 (*)    MCH 23.6 (*)    RDW 16.6 (*)    All other components within normal limits  BASIC METABOLIC  PANEL - Abnormal; Notable for the following:    Glucose, Bld 131 (*)    GFR calc non Af Amer 65 (*)    GFR calc Af Amer 75 (*)    All other components within normal limits  D-DIMER, QUANTITATIVE - Abnormal; Notable for the following:    D-Dimer, Quant 0.53 (*)    All other components within normal limits    Imaging Review Dg Chest 2 View  01/07/2014   CLINICAL DATA:  Leg injury.  Cough  EXAM: CHEST  2 VIEW  COMPARISON:  10/21/2009.  FINDINGS: A right-sided catheter is identified which is of on certain etiology. The catheter appears to terminate in the projection of the cavoatrial junction. The heart size and mediastinal contours are within normal limits. Both lungs are clear. The visualized skeletal structures are unremarkable.  IMPRESSION: No acute cardiopulmonary abnormalities.   Electronically Signed   By: Kerby Moors M.D.   On: 01/07/2014 20:43   Ct Angio Chest Pe W/cm &/or Wo Cm  01/07/2014   CLINICAL DATA:  Shortness of breath, positive D-dimer  EXAM: CT ANGIOGRAPHY CHEST WITH CONTRAST  TECHNIQUE: Multidetector CT imaging of the chest was performed using the standard protocol during bolus administration of intravenous contrast. Multiplanar CT image reconstructions and MIPs were obtained to evaluate the vascular anatomy.  CONTRAST:  41mL OMNIPAQUE IOHEXOL 350 MG/ML SOLN  COMPARISON:  None.  FINDINGS: There is adequate opacification of the central pulmonary arteries. There is no central pulmonary embolus. The segmental and subsegmental pulmonary artery  branches are suboptimally opacified and are nondiagnostic for evaluation of pulmonary emboli. The main pulmonary artery, right main pulmonary artery and left main pulmonary arteries are normal in size. The heart size is normal. There is no pericardial effusion.  The lungs are clear. There is no focal consolidation, pleural effusion or pneumothorax.  There is no axillary, hilar, or mediastinal adenopathy.  There is no lytic or blastic osseous lesion. There is mild thoracic spine spondylosis.  The visualized portions of the upper abdomen are unremarkable.  Review of the MIP images confirms the above findings.  IMPRESSION: 1. There is adequate opacification of the central pulmonary arteries. There is no central pulmonary embolus. The segmental and subsegmental pulmonary artery branches are suboptimally opacified and are nondiagnostic for evaluation of pulmonary emboli.   Electronically Signed   By: Kathreen Devoid   On: 01/07/2014 21:43     EKG Interpretation None      MDM  Patients Wells criteria for PE is 1.5 but with + Dimer and subjective SOB will obtain PE study  Final diagnoses:  URI (upper respiratory infection)  Bruise         Garald Balding, NP 01/07/14 2249

## 2014-01-07 NOTE — ED Provider Notes (Signed)
Medical screening examination/treatment/procedure(s) were performed by non-physician practitioner and as supervising physician I was immediately available for consultation/collaboration.   EKG Interpretation None        Orpah Greek, MD 01/07/14 2256

## 2014-01-17 ENCOUNTER — Other Ambulatory Visit: Payer: Self-pay | Admitting: Endocrinology

## 2014-01-18 ENCOUNTER — Telehealth: Payer: Self-pay | Admitting: Endocrinology

## 2014-01-18 NOTE — Telephone Encounter (Signed)
Called pt, set up for appointment on 7/14. Coming to discuss medication price.

## 2014-01-18 NOTE — Telephone Encounter (Signed)
Patient would like to know if Dr Loanne Drilling could prescribe her Novalog flex pens until her follow visit.  Thank you

## 2014-01-21 ENCOUNTER — Other Ambulatory Visit: Payer: Self-pay

## 2014-01-21 MED ORDER — DILTIAZEM HCL ER 120 MG PO CP24: 120.0000 mg | ORAL_CAPSULE | Freq: Every day | ORAL | Status: DC

## 2014-01-22 ENCOUNTER — Encounter: Payer: Medicare HMO | Attending: Endocrinology | Admitting: Nutrition

## 2014-01-22 ENCOUNTER — Ambulatory Visit (INDEPENDENT_AMBULATORY_CARE_PROVIDER_SITE_OTHER): Payer: Medicare HMO | Admitting: Endocrinology

## 2014-01-22 VITALS — BP 134/66 | HR 105 | Temp 98.6°F

## 2014-01-22 DIAGNOSIS — E119 Type 2 diabetes mellitus without complications: Secondary | ICD-10-CM | POA: Diagnosis present

## 2014-01-22 DIAGNOSIS — Z794 Long term (current) use of insulin: Secondary | ICD-10-CM | POA: Diagnosis not present

## 2014-01-22 LAB — MICROALBUMIN / CREATININE URINE RATIO
Creatinine,U: 251.1 mg/dL
Microalb Creat Ratio: 0.7 mg/g (ref 0.0–30.0)
Microalb, Ur: 1.7 mg/dL (ref 0.0–1.9)

## 2014-01-22 LAB — HEMOGLOBIN A1C: Hgb A1c MFr Bld: 6.5 % (ref 4.6–6.5)

## 2014-01-22 MED ORDER — INSULIN REGULAR HUMAN 100 UNIT/ML IJ SOLN: INTRAMUSCULAR | Status: DC

## 2014-01-22 NOTE — Patient Instructions (Signed)
Read over handout on how to inject insulin using a vial and syringe Inject 25u of R insullin before breakfast, 30 u  before lunch and 20u  before supper Call if questions

## 2014-01-22 NOTE — Progress Notes (Signed)
Subjective:    Patient ID: Carla Jensen, female    DOB: [birth year], 53 y.o.   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  no cbg record, but states cbg's vary from 67-101 in am (she does not check at other times).  She says she can no longer afford insulin analogs.   Past Medical History  Diagnosis Date  . Asthma   . Arthritis   . Depression   . GERD (gastroesophageal reflux disease)   . Allergy   . Hypertension   . Chickenpox   . 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 Diabetes mellitus without complication   . Stroke     Past Surgical History  Procedure Laterality Date  . Knee arthroscopy Left 5/06  . Tendon repair Right 3/07    Elbow  . Achilles tendon repair Left 1/08    Dr.Norris  . Achilles tendon repair Right 7/09    Dr.Norris  . Elbow surgery      History   Social History  . Marital Status: Married    Spouse Name: N/A    Number of Children: N/A  . Years of Education: N/A   Occupational History  . Not on file.   Social History Main Topics  . Smoking status: Never Smoker   . Smokeless tobacco: Never Used  . Alcohol Use: No  . Drug Use: No  . Sexual Activity: Not on file   Other Topics Concern  . Not on file   Social History Narrative  . No narrative on file    Current Outpatient Prescriptions on File Prior to Visit  Medication Sig Dispense Refill  . albuterol (PROAIR HFA) 108 (90 BASE) MCG/ACT inhaler Inhale 2 puffs into the lungs every 6 (six) hours as needed for wheezing.  1 Inhaler  0  . albuterol (PROVENTIL) (2.5 MG/3ML) 0.083% nebulizer solution Take 3 mLs (2.5 mg total) by nebulization every 6 (six) hours as needed for wheezing.  75 mL  2  .  aspirin 81 MG tablet Take 81 mg by mouth daily.      . beclomethasone (QVAR) 40 MCG/ACT inhaler Inhale 2 puffs into the lungs 2 (two) times daily.  1 Inhaler  12  . Blood Glucose Monitoring Suppl (ONE TOUCH ULTRA SYSTEM KIT) W/DEVICE KIT 1 kit by Does not apply route once. Dx. 250.00  1 each  0  . CINNAMON PO Take 1,000 mg by mouth daily.      Marland Kitchen diltiazem (DILACOR XR) 120 MG 24 hr capsule Take 1 capsule (120 mg total) by mouth daily.  90 capsule  1  . ferrous sulfate 325 (65 FE) MG tablet Take 650 mg by mouth daily with breakfast.       . gabapentin (NEURONTIN) 300 MG capsule Take 1 capsule (300 mg total) by mouth 2 (two) times daily.  60 capsule  5  . glucose blood test strip Use as instructed. Pt tests sugars once daily. Dx. 250.00  100 each  12  . Lancets Thin MISC Use as directed. Pt tests sugars once daily. Dx. 250.00  100 each  12  . Multiple Vitamin (MULTIVITAMIN) tablet Take 1 tablet by mouth daily.      . naproxen sodium (ANAPROX) 220 MG  tablet Take 220 mg by mouth daily as needed. For pain      . NOVOLOG FLEXPEN 100 UNIT/ML FlexPen INJECT 20 UNITS INTO THE SKIN 3 (THREE) TIMES DAILY WITH MEALS  5 pen  10  . pantoprazole (PROTONIX) 40 MG tablet Take 1 tablet (40 mg total) by mouth daily.  30 tablet  5  . quinapril-hydrochlorothiazide (ACCURETIC) 20-25 MG per tablet Take 1 tablet by mouth daily. 1 tab by mouth daily      . Senna (NATURAL VEGETABLE LAXATIVE) 65-325 MG TABS Take 1 tablet by mouth 2 (two) times daily.       Marland Kitchen tiZANidine (ZANAFLEX) 4 MG tablet Take 4 mg by mouth every 6 (six) hours as needed for muscle spasms.      . vitamin C (ASCORBIC ACID) 500 MG tablet Take 1,000 mg by mouth daily.        No current facility-administered medications on file prior to visit.    No Known Allergies  Family History  Problem Relation Age of Onset  . Arthritis Mother   . High blood pressure Mother   . Diabetes Mother   . High blood pressure Sister   . High blood pressure Father      BP 134/66  Pulse 105  Temp(Src) 98.6 F (37 C) (Oral)  SpO2 98%  LMP 08/05/2010    Review of Systems Denies LOC and weight change.      Objective:   Physical Exam VITAL SIGNS:  See vs page GENERAL: no distress Pulses: dorsalis pedis intact bilat.   Feet: no deformity. normal color and temp.  no edema Skin:  no ulcer on the feet.   Neuro: sensation is intact to touch on the feet.    Lab Results  Component Value Date   HGBA1C 6.5 01/22/2014      Assessment & Plan:  DM: Based on the pattern of her cbg's, she needs some adjustment in her therapy Noncompliance with cbg checking and f/u appts: I'll work around this as best I can.   Patient is advised the following: Patient Instructions  check your blood sugar twice a day.  vary the time of day when you check, between before the 3 meals, and at bedtime.  also check if you have symptoms of your blood sugar being too high or too low.  please keep a record of the readings and bring it to your next appointment here.  please call us sooner if your blood sugar goes below 70, or if you have a lot of readings over 200. Please change novolog to regular insulin, 3 times a day (just before each meal): 25-30-20 units. Please come back for a follow-up appointment in 3 months.  blood and urine tests are being requested for you today.  We'll contact you with results.

## 2014-01-22 NOTE — Patient Instructions (Addendum)
check your blood sugar twice a day.  vary the time of day when you check, between before the 3 meals, and at bedtime.  also check if you have symptoms of your blood sugar being too high or too low.  please keep a record of the readings and bring it to your next appointment here.  please call us sooner if your blood sugar goes below 70, or if you have a lot of readings over 200. Please change novolog to regular insulin, 3 times a day (just before each meal): 25-30-20 units. Please come back for a follow-up appointment in 3 months.  blood and urine tests are being requested for you today.  We'll contact you with results.

## 2014-01-22 NOTE — Progress Notes (Signed)
This patient and her husband were instructed on the use of the vial and syringe using Humulin R insulin We discussed the timing of the R insulin, and the need to wait 30-45 minutes after injecting, before eating her meals.  She reports that waiting that long will not be a problem for her. She was shown how to draw up the insulin into the syringe and did not feel the need to re demonstrate the procedure.   She was given a handout on how to use a vial and syringe and had no final questions.   She acknowledge the need to keep the Reg insulin the same dose as her Novolog insulin, and had no final questions.

## 2014-01-29 ENCOUNTER — Ambulatory Visit: Payer: Managed Care, Other (non HMO) | Admitting: Endocrinology

## 2014-04-05 ENCOUNTER — Ambulatory Visit (INDEPENDENT_AMBULATORY_CARE_PROVIDER_SITE_OTHER): Payer: Medicare HMO | Admitting: Family Medicine

## 2014-04-05 ENCOUNTER — Encounter: Payer: Self-pay | Admitting: Family Medicine

## 2014-04-05 VITALS — BP 150/82 | HR 99 | Temp 98.5°F | Wt 265.4 lb

## 2014-04-05 DIAGNOSIS — K2100 Gastro-esophageal reflux disease with esophagitis, without bleeding: Secondary | ICD-10-CM

## 2014-04-05 DIAGNOSIS — R059 Cough, unspecified: Secondary | ICD-10-CM

## 2014-04-05 DIAGNOSIS — E785 Hyperlipidemia, unspecified: Secondary | ICD-10-CM

## 2014-04-05 DIAGNOSIS — J4521 Mild intermittent asthma with (acute) exacerbation: Secondary | ICD-10-CM

## 2014-04-05 DIAGNOSIS — R05 Cough: Secondary | ICD-10-CM

## 2014-04-05 DIAGNOSIS — I10 Essential (primary) hypertension: Secondary | ICD-10-CM

## 2014-04-05 DIAGNOSIS — J45901 Unspecified asthma with (acute) exacerbation: Secondary | ICD-10-CM

## 2014-04-05 DIAGNOSIS — D508 Other iron deficiency anemias: Secondary | ICD-10-CM

## 2014-04-05 DIAGNOSIS — G894 Chronic pain syndrome: Secondary | ICD-10-CM

## 2014-04-05 MED ORDER — PANTOPRAZOLE SODIUM 40 MG PO TBEC: 40.0000 mg | DELAYED_RELEASE_TABLET | Freq: Every day | ORAL | Status: DC

## 2014-04-05 MED ORDER — BECLOMETHASONE DIPROPIONATE 40 MCG/ACT IN AERS: 2.0000 | INHALATION_SPRAY | Freq: Two times a day (BID) | RESPIRATORY_TRACT | Status: DC

## 2014-04-05 MED ORDER — QUINAPRIL-HYDROCHLOROTHIAZIDE 20-25 MG PO TABS: 1.0000 | ORAL_TABLET | Freq: Every day | ORAL | Status: DC

## 2014-04-05 MED ORDER — TIZANIDINE HCL 4 MG PO TABS: 4.0000 mg | ORAL_TABLET | Freq: Four times a day (QID) | ORAL | Status: DC | PRN

## 2014-04-05 MED ORDER — GUAIFENESIN-CODEINE 100-10 MG/5ML PO SYRP: 5.0000 mL | ORAL_SOLUTION | Freq: Three times a day (TID) | ORAL | Status: DC | PRN

## 2014-04-05 MED ORDER — AMLODIPINE BESYLATE 5 MG PO TABS: 5.0000 mg | ORAL_TABLET | Freq: Every day | ORAL | Status: DC

## 2014-04-05 MED ORDER — ALBUTEROL SULFATE HFA 108 (90 BASE) MCG/ACT IN AERS
2.0000 | INHALATION_SPRAY | Freq: Four times a day (QID) | RESPIRATORY_TRACT | Status: DC | PRN
Start: 2014-04-05 — End: 2014-08-12

## 2014-04-05 MED ORDER — GABAPENTIN 300 MG PO CAPS: 300.0000 mg | ORAL_CAPSULE | Freq: Two times a day (BID) | ORAL | Status: DC

## 2014-04-05 NOTE — Progress Notes (Signed)
Pre visit review using our clinic review tool, if applicable. No additional management support is needed unless otherwise documented below in the visit note. 

## 2014-04-05 NOTE — Patient Instructions (Signed)

## 2014-04-05 NOTE — Progress Notes (Signed)
  Subjective:    Patient here for follow-up of elevated blood pressure.  She is not exercising and is not adherent to a low-salt diet.  Blood pressure is not well controlled at home. Cardiac symptoms: none. Patient denies: chest pain, chest pressure/discomfort, claudication, dyspnea, exertional chest pressure/discomfort, fatigue, irregular heart beat, lower extremity edema, near-syncope, orthopnea, palpitations, paroxysmal nocturnal dyspnea, syncope and tachypnea. Cardiovascular risk factors: advanced age (older than 3 for men, 74 for women), diabetes mellitus, dyslipidemia, hypertension, obesity (BMI >= 30 kg/m2) and sedentary lifestyle. Use of agents associated with hypertension: none. History of target organ damage: none.  The following portions of the patient's history were reviewed and updated as appropriate: allergies, current medications, past family history, past medical history, past social history, past surgical history and problem list.  Review of Systems Pertinent items are noted in HPI.     Objective:    BP 150/82  Pulse 99  Temp(Src) 98.5 F (36.9 C) (Oral)  Wt 265 lb 6.4 oz (120.385 kg)  SpO2 98%  LMP 08/05/2010 General appearance: alert, cooperative, appears stated age and no distress Nose: Nares normal. Septum midline. Mucosa normal. No drainage or sinus tenderness. Throat: lips, mucosa, and tongue normal; teeth and gums normal Neck: no adenopathy, no carotid bruit, no JVD, supple, symmetrical, trachea midline and thyroid not enlarged, symmetric, no tenderness/mass/nodules Lungs: clear to auscultation bilaterally Heart: S1, S2 normal Extremities: extremities normal, atraumatic, no cyanosis or edema    Assessment:    Hypertension, normal blood pressure . Evidence of target organ damage: none.    Plan:     1. Essential hypertension Add norvasc--- not controlled - quinapril-hydrochlorothiazide (ACCURETIC) 20-25 MG per tablet; Take 1 tablet by mouth daily. 1 tab by  mouth daily  Dispense: 90 tablet; Refill: 1 - amLODipine (NORVASC) 5 MG tablet; Take 1 tablet (5 mg total) by mouth daily.  Dispense: 90 tablet; Refill: 3 - Basic metabolic panel; Future  2. Asthma with acute exacerbation, mild intermittent  - albuterol (PROAIR HFA) 108 (90 BASE) MCG/ACT inhaler; Inhale 2 puffs into the lungs every 6 (six) hours as needed for wheezing.  Dispense: 1 Inhaler; Refill: 0 - beclomethasone (QVAR) 40 MCG/ACT inhaler; Inhale 2 puffs into the lungs 2 (two) times daily.  Dispense: 1 Inhaler; Refill: 12  3. Chronic pain syndrome  - gabapentin (NEURONTIN) 300 MG capsule; Take 1 capsule (300 mg total) by mouth 2 (two) times daily.  Dispense: 60 capsule; Refill: 5 - tiZANidine (ZANAFLEX) 4 MG tablet; Take 1 tablet (4 mg total) by mouth every 6 (six) hours as needed for muscle spasms.  Dispense: 30 tablet; Refill: 1  4. Gastroesophageal reflux disease with esophagitis  - pantoprazole (PROTONIX) 40 MG tablet; Take 1 tablet (40 mg total) by mouth daily.  Dispense: 30 tablet; Refill: 5  5. Other and unspecified hyperlipidemia  - Hepatic function panel; Future - Lipid panel; Future  6. Other iron deficiency anemias  - CBC with Differential; Future - IBC panel; Future - Ferritin; Future  7. Cough Uri---flonase or nasocort  - guaiFENesin-codeine (ROBITUSSIN AC) 100-10 MG/5ML syrup; Take 5 mLs by mouth 3 (three) times daily as needed for cough.  Dispense: 120 mL; Refill: 0

## 2014-04-19 ENCOUNTER — Ambulatory Visit: Payer: Managed Care, Other (non HMO) | Admitting: Family Medicine

## 2014-04-26 ENCOUNTER — Ambulatory Visit (INDEPENDENT_AMBULATORY_CARE_PROVIDER_SITE_OTHER): Payer: Medicare HMO | Admitting: Endocrinology

## 2014-04-26 ENCOUNTER — Encounter: Payer: Self-pay | Admitting: Endocrinology

## 2014-04-26 VITALS — BP 124/68 | HR 104 | Temp 98.2°F | Ht 63.0 in | Wt 260.0 lb

## 2014-04-26 DIAGNOSIS — E119 Type 2 diabetes mellitus without complications: Secondary | ICD-10-CM

## 2014-04-26 LAB — LIPID PANEL
Cholesterol: 129 mg/dL (ref 0–200)
HDL: 39.4 mg/dL (ref >39.00)
LDL Cholesterol: 77 mg/dL (ref 0–99)
NonHDL: 89.6
Total CHOL/HDL Ratio: 3
Triglycerides: 62 mg/dL (ref 0.0–149.0)
VLDL: 12.4 mg/dL (ref 0.0–40.0)

## 2014-04-26 LAB — HEMOGLOBIN A1C: Hgb A1c MFr Bld: 6.4 % (ref 4.6–6.5)

## 2014-04-26 NOTE — Progress Notes (Signed)
Subjective:    Patient ID: Carla Jensen, female    DOB: 1960-12-21, 53 y.o.   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 Complications:  Therapy: insulin. GDM: never DKA: never Severe hypoglycemia: never Pancreatitis: never Other: she takes multiple daily injections; she can no longer afford insulin analogs.  She declines weight-loss surgery Interval history:  She denies hypoglycemia.  pt states she feels well in general.  no cbg record, but states cbg's are well-controlled Past Medical History  Diagnosis Date  . Asthma   . Arthritis   . Depression   . GERD (gastroesophageal reflux disease)   . Allergy   . Hypertension   . Chickenpox   . 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 Diabetes mellitus without complication   . Stroke     Past Surgical History  Procedure Laterality Date  . Knee arthroscopy Left 5/06  . Tendon repair Right 3/07    Elbow  . Achilles tendon repair Left 1/08    Dr.Norris  . Achilles tendon repair Right 7/09    Dr.Norris  . Elbow surgery      History   Social History  . Marital Status: Married    Spouse Name: N/A    Number of Children: N/A  . Years of Education: N/A   Occupational History  . Not on file.   Social History Main Topics  . Smoking status: Never Smoker   . Smokeless tobacco: Never Used  . Alcohol Use: No  . Drug Use: No  . Sexual Activity: Not on file   Other Topics Concern  . Not on file   Social History Narrative  . No narrative on file    Current Outpatient Prescriptions on File Prior to Visit  Medication Sig Dispense Refill  . albuterol (PROAIR HFA) 108 (90 BASE) MCG/ACT inhaler Inhale 2 puffs into the lungs every 6 (six) hours as needed for wheezing.  1 Inhaler  0  . albuterol (PROVENTIL) (2.5 MG/3ML) 0.083% nebulizer solution Take 3 mLs (2.5 mg total) by  nebulization every 6 (six) hours as needed for wheezing.  75 mL  2  . amLODipine (NORVASC) 5 MG tablet Take 1 tablet (5 mg total) by mouth daily.  90 tablet  3  . aspirin 81 MG tablet Take 81 mg by mouth daily.      . beclomethasone (QVAR) 40 MCG/ACT inhaler Inhale 2 puffs into the lungs 2 (two) times daily.  1 Inhaler  12  . Blood Glucose Monitoring Suppl (ONE TOUCH ULTRA SYSTEM KIT) W/DEVICE KIT 1 kit by Does not apply route once. Dx. 250.00  1 each  0  . CINNAMON PO Take 1,000 mg by mouth daily.      Marland Kitchen diltiazem (DILACOR XR) 120 MG 24 hr capsule Take 1 capsule (120 mg total) by mouth daily.  90 capsule  1  . ferrous sulfate 325 (65 FE) MG tablet Take 650 mg by mouth daily with breakfast.       . gabapentin (NEURONTIN) 300 MG capsule Take 1 capsule (300 mg total) by mouth 2 (two) times daily.  60 capsule  5  . glucose blood test strip Use as instructed. Pt tests sugars once daily. Dx. 250.00  100 each  12  . guaiFENesin-codeine (ROBITUSSIN AC) 100-10 MG/5ML syrup Take 5 mLs by mouth 3 (three) times daily as needed for cough.  [place]  mL  0  . insulin regular (HUMULIN R) 100 units/mL injection 3 times a day (just before each meal), 25-30-20 units, and syringes 3/day  30 mL  11  . Lancets Thin MISC Use as directed. Pt tests sugars once daily. Dx. 250.00  100 each  12  . Multiple Vitamin (MULTIVITAMIN) tablet Take 1 tablet by mouth daily.      . naproxen sodium (ANAPROX) 220 MG tablet Take 220 mg by mouth daily as needed. For pain      . pantoprazole (PROTONIX) 40 MG tablet Take 1 tablet (40 mg total) by mouth daily.  30 tablet  5  . quinapril-hydrochlorothiazide (ACCURETIC) 20-25 MG per tablet Take 1 tablet by mouth daily. 1 tab by mouth daily  90 tablet  1  . Senna (NATURAL VEGETABLE LAXATIVE) 65-325 MG TABS Take 1 tablet by mouth 2 (two) times daily.       Marland Kitchen tiZANidine (ZANAFLEX) 4 MG tablet Take 1 tablet (4 mg total) by mouth every 6 (six) hours as needed for muscle spasms.  30 tablet  1  . vitamin  C (ASCORBIC ACID) 500 MG tablet Take 1,000 mg by mouth daily.        No current facility-administered medications on file prior to visit.    No Known Allergies  Family History  Problem Relation Age of Onset  . Arthritis Mother   . High blood pressure Mother   . Diabetes Mother   . High blood pressure Sister   . High blood pressure Father    BP 124/68  Pulse 104  Temp(Src) 98.2 F (36.8 C) (Oral)  Ht $R'5\' 3"'zn$  (1.6 m)  Wt 260 lb (117.935 kg)  BMI 46.07 kg/m2  SpO2 98%  LMP 08/05/2010  Review of Systems pt denies hypoglycemia and weight change    Objective:   Physical Exam VITAL SIGNS:  See vs page GENERAL: no distress Pulses: dorsalis pedis intact bilat.   Feet: no deformity.  no edema Skin:  no ulcer on the feet.  normal color and temp. Neuro: sensation is intact to touch on the feet  Lab Results  Component Value Date   HGBA1C 6.4 04/26/2014       Assessment & Plan:  DM: well-controlled. Morbid obesity, persistent. Noncompliance with cbg recording: I'll work around this as best I can    Patient is advised the following: Patient Instructions  check your blood sugar twice a day.  vary the time of day when you check, between before the 3 meals, and at bedtime.  also check if you have symptoms of your blood sugar being too high or too low.  please keep a record of the readings and bring it to your next appointment here.  please call us sooner if your blood sugar goes below 70, or if you have a lot of readings over 200. Please come back for a follow-up appointment in 3 months.  Please let me know if you reconsider the weight-loss surgery.   blood tests are being requested for you today.  We'll contact you with results.

## 2014-04-26 NOTE — Patient Instructions (Addendum)
check your blood sugar twice a day.  vary the time of day when you check, between before the 3 meals, and at bedtime.  also check if you have symptoms of your blood sugar being too high or too low.  please keep a record of the readings and bring it to your next appointment here.  please call us sooner if your blood sugar goes below 70, or if you have a lot of readings over 200. Please come back for a follow-up appointment in 3 months.  Please let me know if you reconsider the weight-loss surgery.   blood tests are being requested for you today.  We'll contact you with results.

## 2014-05-19 ENCOUNTER — Other Ambulatory Visit: Payer: Self-pay | Admitting: Family Medicine

## 2014-07-09 ENCOUNTER — Other Ambulatory Visit: Payer: Self-pay

## 2014-07-09 MED ORDER — DILTIAZEM HCL ER 120 MG PO CP24: 120.0000 mg | ORAL_CAPSULE | Freq: Every day | ORAL | Status: DC

## 2014-07-10 ENCOUNTER — Other Ambulatory Visit: Payer: Self-pay | Admitting: Family Medicine

## 2014-07-26 ENCOUNTER — Encounter: Payer: Self-pay | Admitting: Endocrinology

## 2014-07-26 ENCOUNTER — Ambulatory Visit (INDEPENDENT_AMBULATORY_CARE_PROVIDER_SITE_OTHER): Payer: Medicare Other | Admitting: Endocrinology

## 2014-07-26 VITALS — BP 138/62 | HR 115 | Temp 98.9°F | Ht 63.0 in | Wt 254.0 lb

## 2014-07-26 DIAGNOSIS — E119 Type 2 diabetes mellitus without complications: Secondary | ICD-10-CM | POA: Diagnosis not present

## 2014-07-26 LAB — HEMOGLOBIN A1C: Hgb A1c MFr Bld: 6.3 % (ref 4.6–6.5)

## 2014-07-26 NOTE — Progress Notes (Signed)
Subjective:    Patient ID: Carla Jensen, female    DOB: Jun 08, 1961, 54 y.o.   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 Complications: none Therapy: insulin since soon after dx. GDM: never DKA: never Severe hypoglycemia: never Pancreatitis: never Other: she takes multiple daily injections; she cannot afford insulin analogs.  She declines weight-loss surgery Interval history: She denies hypoglycemia.  pt states she feels well in general.  no cbg record, but states cbg's are well-controlled Past Medical History  Diagnosis Date  . Asthma   . Arthritis   . Depression   . GERD (gastroesophageal reflux disease)   . Allergy   . Hypertension   . Chickenpox   . 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 Diabetes mellitus without complication   . Stroke     Past Surgical History  Procedure Laterality Date  . Knee arthroscopy Left 5/06  . Tendon repair Right 3/07    Elbow  . Achilles tendon repair Left 1/08    Dr.Norris  . Achilles tendon repair Right 7/09    Dr.Norris  . Elbow surgery      History   Social History  . Marital Status: Married    Spouse Name: N/A    Number of Children: N/A  . Years of Education: N/A   Occupational History  . Not on file.   Social History Main Topics  . Smoking status: Never Smoker   . Smokeless tobacco: Never Used  . Alcohol Use: No  . Drug Use: No  . Sexual Activity: Not on file   Other Topics Concern  . Not on file   Social History Narrative    Current Outpatient Prescriptions on File Prior to Visit  Medication Sig Dispense Refill  . albuterol (PROAIR HFA) 108 (90 BASE) MCG/ACT inhaler Inhale 2 puffs into the lungs every 6 (six) hours as needed for wheezing. 1 Inhaler 0  . albuterol (PROVENTIL) (2.5 MG/3ML) 0.083% nebulizer solution Take 3 mLs (2.5 mg total) by nebulization every  6 (six) hours as needed for wheezing. 75 mL 2  . amLODipine (NORVASC) 5 MG tablet Take 1 tablet (5 mg total) by mouth daily. 90 tablet 3  . aspirin 81 MG tablet Take 81 mg by mouth daily.    . beclomethasone (QVAR) 40 MCG/ACT inhaler Inhale 2 puffs into the lungs 2 (two) times daily. 1 Inhaler 12  . Blood Glucose Monitoring Suppl (ONE TOUCH ULTRA SYSTEM KIT) W/DEVICE KIT 1 kit by Does not apply route once. Dx. 250.00 1 each 0  . CINNAMON PO Take 1,000 mg by mouth daily.    Marland Kitchen diltiazem (DILACOR XR) 120 MG 24 hr capsule Take 1 capsule (120 mg total) by mouth daily. 90 capsule 1  . ferrous sulfate 325 (65 FE) MG tablet Take 650 mg by mouth daily with breakfast.     . gabapentin (NEURONTIN) 300 MG capsule Take 1 capsule (300 mg total) by mouth 2 (two) times daily. 60 capsule 5  . glucose blood test strip Use as instructed. Pt tests sugars once daily. Dx. 250.00 100 each 12  . guaiFENesin-codeine (ROBITUSSIN AC) 100-10 MG/5ML syrup Take 5 mLs by mouth 3 (three) times daily as needed for cough. 120 mL 0  . insulin regular (HUMULIN R) 100 units/mL injection 3 times a day (just before each meal), 25-30-20 units, and syringes 3/day (Patient taking differently: 3  times a day (just before each meal), 25-25-20 units, and syringes 3/day) 30 mL 11  . Lancets Thin MISC Use as directed. Pt tests sugars once daily. Dx. 250.00 100 each 12  . Multiple Vitamin (MULTIVITAMIN) tablet Take 1 tablet by mouth daily.    . naproxen sodium (ANAPROX) 220 MG tablet Take 220 mg by mouth daily as needed. For pain    . pantoprazole (PROTONIX) 40 MG tablet Take 1 tablet (40 mg total) by mouth daily. 30 tablet 5  . quinapril-hydrochlorothiazide (ACCURETIC) 20-25 MG per tablet Take 1 tablet by mouth daily. 1 tab by mouth daily 90 tablet 1  . Senna (NATURAL VEGETABLE LAXATIVE) 65-325 MG TABS Take 1 tablet by mouth 2 (two) times daily.     Marland Kitchen tiZANidine (ZANAFLEX) 4 MG tablet TAKE 1 TABLET BY MOUTH EVERY 6 (SIX) HOURS AS NEEDED FOR  MUSCLE SPASMS. 30 tablet 1  . vitamin C (ASCORBIC ACID) 500 MG tablet Take 1,000 mg by mouth daily.      No current facility-administered medications on file prior to visit.    No Known Allergies  Family History  Problem Relation Age of Onset  . Arthritis Mother   . High blood pressure Mother   . Diabetes Mother   . High blood pressure Sister   . High blood pressure Father     BP 138/62 mmHg  Pulse 115  Temp(Src) 98.9 F (37.2 C) (Oral)  Ht $R'5\' 3"'uT$  (1.6 m)  Wt 254 lb (115.214 kg)  BMI 45.01 kg/m2  SpO2 90%  LMP 08/05/2010    Review of Systems Denies palpitations and sob    Objective:   Physical Exam VITAL SIGNS:  See vs page GENERAL: no distress Pulses: dorsalis pedis intact bilat.   Feet: no deformity. no edema.  Old healed surgical scars at both posterior ankles. Skin: no ulcer on the feet. normal color and temp.  Neuro: sensation is intact to touch on the feet    i personally reviewed electrocardiogram tracing: normal  Lab Results  Component Value Date   HGBA1C 6.3 07/26/2014      Assessment & Plan:  DM: overcontrolled HTN: with situational component.  Please continue the same medication. Tachycardia: situational  Patient is advised the following: Patient Instructions  check your blood sugar twice a day.  vary the time of day when you check, between before the 3 meals, and at bedtime.  also check if you have symptoms of your blood sugar being too high or too low.  please keep a record of the readings and bring it to your next appointment here.  please call us sooner if your blood sugar goes below 70, or if you have a lot of readings over 200. Please come back for a follow-up appointment in 3 months.  blood tests are being requested for you today.  We'll contact you with results.       Please reduce the lunch insulin to 25 units.

## 2014-07-26 NOTE — Patient Instructions (Addendum)
check your blood sugar twice a day.  vary the time of day when you check, between before the 3 meals, and at bedtime.  also check if you have symptoms of your blood sugar being too high or too low.  please keep a record of the readings and bring it to your next appointment here.  please call us sooner if your blood sugar goes below 70, or if you have a lot of readings over 200.    Please come back for a follow-up appointment in 3 months.  blood tests are being requested for you today.  We'll contact you with results.     

## 2014-08-11 ENCOUNTER — Other Ambulatory Visit: Payer: Self-pay | Admitting: Family Medicine

## 2014-08-12 ENCOUNTER — Ambulatory Visit (INDEPENDENT_AMBULATORY_CARE_PROVIDER_SITE_OTHER): Payer: Medicare Other | Admitting: Medical

## 2014-08-12 ENCOUNTER — Other Ambulatory Visit: Payer: Self-pay

## 2014-08-12 ENCOUNTER — Encounter: Payer: Self-pay | Admitting: Medical

## 2014-08-12 VITALS — BP 154/70 | HR 111 | Temp 99.0°F | Ht 63.0 in | Wt 255.8 lb

## 2014-08-12 DIAGNOSIS — J0100 Acute maxillary sinusitis, unspecified: Secondary | ICD-10-CM | POA: Diagnosis not present

## 2014-08-12 DIAGNOSIS — L089 Local infection of the skin and subcutaneous tissue, unspecified: Secondary | ICD-10-CM | POA: Diagnosis not present

## 2014-08-12 DIAGNOSIS — J4521 Mild intermittent asthma with (acute) exacerbation: Secondary | ICD-10-CM

## 2014-08-12 DIAGNOSIS — J200 Acute bronchitis due to Mycoplasma pneumoniae: Secondary | ICD-10-CM

## 2014-08-12 DIAGNOSIS — L309 Dermatitis, unspecified: Secondary | ICD-10-CM | POA: Diagnosis not present

## 2014-08-12 DIAGNOSIS — J4531 Mild persistent asthma with (acute) exacerbation: Secondary | ICD-10-CM

## 2014-08-12 MED ORDER — AMMONIUM LACTATE 12 % EX CREA: TOPICAL_CREAM | CUTANEOUS | Status: DC | PRN

## 2014-08-12 MED ORDER — DOXYCYCLINE HYCLATE 100 MG PO TABS: 100.0000 mg | ORAL_TABLET | Freq: Two times a day (BID) | ORAL | Status: DC

## 2014-08-12 MED ORDER — FLUTICASONE PROPIONATE 50 MCG/ACT NA SUSP: 2.0000 | Freq: Every day | NASAL | Status: DC

## 2014-08-12 MED ORDER — BECLOMETHASONE DIPROPIONATE 40 MCG/ACT IN AERS: 2.0000 | INHALATION_SPRAY | Freq: Two times a day (BID) | RESPIRATORY_TRACT | Status: DC

## 2014-08-12 MED ORDER — BENZONATATE 100 MG PO CAPS
100.0000 mg | ORAL_CAPSULE | Freq: Three times a day (TID) | ORAL | Status: DC | PRN
Start: 2014-08-12 — End: 2014-10-08

## 2014-08-12 MED ORDER — TRIAMCINOLONE ACETONIDE 0.1 % EX CREA: 1.0000 "application " | TOPICAL_CREAM | Freq: Two times a day (BID) | CUTANEOUS | Status: DC

## 2014-08-12 MED ORDER — ALBUTEROL SULFATE HFA 108 (90 BASE) MCG/ACT IN AERS: 2.0000 | INHALATION_SPRAY | Freq: Four times a day (QID) | RESPIRATORY_TRACT | Status: DC | PRN

## 2014-08-12 NOTE — Progress Notes (Signed)
Subjective:    Patient ID: Carla Jensen, female    DOB: 09-Feb-1961, 54 y.o.   MRN: [id number]  HPI   Pt in today reporting  cough, nasal congestion and runny nose for  5 Days. Sinus pressure since this  Associated symptoms( below yes or no)  Fever-no Chills-yes Chest congestion-yes Sneezing- yes Itching eyes-no Sore throat- no Post-nasal drainage-yes Wheezing-yes Purulent drainage-no Fatigue-yes mild.  Also has some flakiness to her rt lower leg flakiness since Saturday. This weekend some redness and warmth above the dried flaky skin area.   Pt also got some small scattered bumps on her hands. Between fingers.This came up Thursday of last week. She washes her hand 15 times a day.    Review of Systems     Objective:   Physical Exam  General  Mental Status - Alert. General Appearance - Well groomed. Not in acute distress.  Skin Rashes- No Rashes.  HEENT Head- Normal. Ear Auditory Canal - Left- Normal. Right - Normal.Tympanic Membrane- Left- Normal. Right- Normal. Eye Sclera/Conjunctiva- Left- Normal. Right- Normal. Nose & Sinuses Nasal Mucosa- Left-  Boggy and Congested. Right-  Boggy and  Congested.Bilateral maxillary and frontal sinus pressure. Mouth & Throat Lips: Upper Lip- Normal: no dryness, cracking, pallor, cyanosis, or vesicular eruption. Lower Lip-Normal: no dryness, cracking, pallor, cyanosis or vesicular eruption. Buccal Mucosa- Bilateral- No Aphthous ulcers. Oropharynx- No Discharge or Erythema. Tonsils: Characteristics- Bilateral- No Erythema or Congestion. Size/Enlargement- Bilateral- No enlargement. Discharge- bilateral-None.  Neck Neck- Supple. No Masses.   Chest and Lung Exam Auscultation: Breath Sounds:-Clear even and unlabored.  Cardiovascular Auscultation:Rythm- Regular, rate and rhythm. Murmurs & Other Heart Sounds:Ausculatation of the heart reveal- No Murmurs.  Lymphatic Head & Neck General Head & Neck Lymphatics: Bilateral:  Description- No Localized lymphadenopathy.    Skin- Rt lower ext- medial aspect of the heal dry flaky skin. But above this area of redness, warmth and tender.(no ulcers or breakdown)  Hands- mild dry appearance with mild appearance of dermatitis.  Past Medical History  Diagnosis Date  . Asthma   . Arthritis   . Depression   . GERD (gastroesophageal reflux disease)   . Allergy   . Hypertension   . Chickenpox   . 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 Diabetes mellitus without complication   . Stroke     History   Social History  . Marital Status: Married    Spouse Name: N/A    Number of Children: N/A  . Years of Education: N/A   Occupational History  . Not on file.   Social History Main Topics  . Smoking status: Never Smoker   . Smokeless tobacco: Never Used  . Alcohol Use: No  . Drug Use: No  . Sexual Activity: Not on file   Other Topics Concern  . Not on file   Social History Narrative    Past Surgical History  Procedure Laterality Date  . Knee arthroscopy Left 5/06  . Tendon repair Right 3/07    Elbow  . Achilles tendon repair Left 1/08    Dr.Norris  . Achilles tendon repair Right 7/09    Dr.Norris  . Elbow surgery      Family History  Problem Relation Age of Onset  . Arthritis Mother   . High blood pressure Mother   . Diabetes Mother   . High blood pressure Sister   . High blood pressure Father     No Known Allergies  Current Outpatient  Prescriptions on File Prior to Visit  Medication Sig Dispense Refill  . albuterol (PROVENTIL) (2.5 MG/3ML) 0.083% nebulizer solution Take 3 mLs (2.5 mg total) by nebulization every 6 (six) hours as needed for wheezing. 75 mL 2  . amLODipine (NORVASC) 5 MG tablet Take 1 tablet (5 mg total) by mouth daily. 90 tablet 3  . aspirin 81 MG tablet Take 81 mg by mouth daily.    . Blood Glucose Monitoring Suppl (ONE TOUCH ULTRA SYSTEM KIT) W/DEVICE KIT 1 kit by Does not apply  route once. Dx. 250.00 1 each 0  . CINNAMON PO Take 1,000 mg by mouth daily.    Marland Kitchen diltiazem (DILACOR XR) 120 MG 24 hr capsule Take 1 capsule (120 mg total) by mouth daily. 90 capsule 1  . ferrous sulfate 325 (65 FE) MG tablet Take 650 mg by mouth daily with breakfast.     . gabapentin (NEURONTIN) 300 MG capsule Take 1 capsule (300 mg total) by mouth 2 (two) times daily. 60 capsule 5  . glucose blood test strip Use as instructed. Pt tests sugars once daily. Dx. 250.00 100 each 12  . guaiFENesin-codeine (ROBITUSSIN AC) 100-10 MG/5ML syrup Take 5 mLs by mouth 3 (three) times daily as needed for cough. 120 mL 0  . insulin regular (HUMULIN R) 100 units/mL injection 3 times a day (just before each meal), 25-30-20 units, and syringes 3/day (Patient taking differently: 3 times a day (just before each meal), 25-25-20 units, and syringes 3/day) 30 mL 11  . Lancets Thin MISC Use as directed. Pt tests sugars once daily. Dx. 250.00 100 each 12  . Multiple Vitamin (MULTIVITAMIN) tablet Take 1 tablet by mouth daily.    . naproxen sodium (ANAPROX) 220 MG tablet Take 220 mg by mouth daily as needed. For pain    . pantoprazole (PROTONIX) 40 MG tablet Take 1 tablet (40 mg total) by mouth daily. 30 tablet 5  . quinapril-hydrochlorothiazide (ACCURETIC) 20-25 MG per tablet Take 1 tablet by mouth daily. 1 tab by mouth daily 90 tablet 1  . Senna (NATURAL VEGETABLE LAXATIVE) 65-325 MG TABS Take 1 tablet by mouth 2 (two) times daily.     Marland Kitchen tiZANidine (ZANAFLEX) 4 MG tablet TAKE 1 TABLET BY MOUTH EVERY 6 (SIX) HOURS AS NEEDED FOR MUSCLE SPASMS. 30 tablet 1  . vitamin C (ASCORBIC ACID) 500 MG tablet Take 1,000 mg by mouth daily.      No current facility-administered medications on file prior to visit.    BP 154/70 mmHg  Pulse 111  Temp(Src) 99 F (37.2 C) (Oral)  Ht 5' 3" (1.6 m)  Wt 255 lb 12.8 oz (116.03 kg)  BMI 45.32 kg/m2  SpO2 97%  LMP 08/05/2010       Assessment & Plan:

## 2014-08-12 NOTE — Patient Instructions (Addendum)
Your appear to have a sinus infection. I am prescribing  Doxycycline  antibiotic for the infection. To help with the nasal congestion I prescribed flonase nasal steroid. For your associated cough, I prescribed cough medicine benzonatate  For your lt foot skin infection, we need to make sure this is improving. If redness or streaking up your leg then ED eval.  For your hand dermatitis, try to wash hands less. And use steroid cream.(no steroid application to your leg/foot)  Rest, hydrate, tylenol for fever.  Follow up in 3-4 days or as needed.

## 2014-08-12 NOTE — Assessment & Plan Note (Signed)
For your hand dermatitis, try to wash hands less. And use steroid cream.(no steroid application to your leg/foot)

## 2014-08-12 NOTE — Progress Notes (Signed)
Pre visit review using our clinic review tool, if applicable. No additional management support is needed unless otherwise documented below in the visit note. 

## 2014-08-12 NOTE — Assessment & Plan Note (Signed)
Your appear to have a sinus infection. I am prescribing  Doxycycline  antibiotic for the infection. To help with the nasal congestion I prescribed flonase nasal steroid. For your associated cough, I prescribed cough medicine benzonatate

## 2014-08-12 NOTE — Assessment & Plan Note (Signed)
For your lt foot skin infection, we need to make sure this is improving. If redness or streaking up your leg then ED eval.

## 2014-08-16 ENCOUNTER — Encounter: Payer: Self-pay | Admitting: Medical

## 2014-08-16 ENCOUNTER — Ambulatory Visit (INDEPENDENT_AMBULATORY_CARE_PROVIDER_SITE_OTHER): Payer: Medicare Other | Admitting: Medical

## 2014-08-16 VITALS — BP 147/68 | HR 111 | Temp 98.8°F | Ht 63.0 in | Wt 256.6 lb

## 2014-08-16 DIAGNOSIS — L089 Local infection of the skin and subcutaneous tissue, unspecified: Secondary | ICD-10-CM | POA: Diagnosis not present

## 2014-08-16 DIAGNOSIS — J0100 Acute maxillary sinusitis, unspecified: Secondary | ICD-10-CM

## 2014-08-16 DIAGNOSIS — L139 Bullous disorder, unspecified: Secondary | ICD-10-CM | POA: Diagnosis not present

## 2014-08-16 MED ORDER — HYDROCODONE-HOMATROPINE 5-1.5 MG/5ML PO SYRP: 5.0000 mL | ORAL_SOLUTION | Freq: Three times a day (TID) | ORAL | Status: DC | PRN

## 2014-08-16 MED ORDER — HYDROXYZINE HCL 25 MG PO TABS: 25.0000 mg | ORAL_TABLET | Freq: Three times a day (TID) | ORAL | Status: DC | PRN

## 2014-08-16 NOTE — Assessment & Plan Note (Signed)
Much improved. Continue doxycycline.

## 2014-08-16 NOTE — Patient Instructions (Addendum)
Sinusitis, acute maxillary Much better but cough is worse. More pnd. Stop benzonatate. Rx hydromet.   Skin infection Much improved. Continue doxycycline.   Bullous dermatitis Pt had some faint small lesion on hands recently on last visit. But since then scattered type lesion and larger on both feet/ankles. Will refer to dermatologist.  Important to note pt seems to have some very small bullous type lesions on hand before doxy was stared.     Follow up 7 days or as needed.

## 2014-08-16 NOTE — Progress Notes (Signed)
Subjective:    Patient ID: Carla Jensen, female    DOB: 10-28-1960, 54 y.o.   MRN: [id number]  HPI   Pt states her sinus are feeling  better but still has the  cough. No fever, no chills or any wheeze.  Her rt heel region flaky skin and appearance of skin infection  is better. But she has out break of various size blisters/bullous lesion on rt foot and left foot. Some on her hands. She had small appearing almost vesicle appearance to hands on last visit.       Review of Systems  Constitutional: Negative for fever, chills and fatigue.  HENT: Positive for congestion, postnasal drip and sinus pressure. Negative for ear discharge, ear pain, sneezing, sore throat and tinnitus.        But much improved sinus pressure.  Respiratory: Positive for cough. Negative for chest tightness, shortness of breath and wheezing.   Cardiovascular: Negative for chest pain and palpitations.  Gastrointestinal: Negative for abdominal pain.  Musculoskeletal: Negative for back pain.  Skin:       New blisters/bullous type lesion all over feet.  Neurological: Negative for dizziness, syncope, weakness, numbness and headaches.  Hematological: Negative for adenopathy. Does not bruise/bleed easily.  Psychiatric/Behavioral: Negative for behavioral problems and confusion.    Past Medical History  Diagnosis Date  . Asthma   . Arthritis   . Depression   . GERD (gastroesophageal reflux disease)   . Allergy   . Hypertension   . Chickenpox   . 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 Diabetes mellitus without complication   . Stroke     History   Social History  . Marital Status: Married    Spouse Name: N/A    Number of Children: N/A  . Years of Education: N/A   Occupational History  . Not on file.   Social History Main Topics  . Smoking status: Never Smoker   . Smokeless tobacco: Never Used  . Alcohol Use: No  . Drug Use: No  . Sexual Activity: Not on file     Other Topics Concern  . Not on file   Social History Narrative    Past Surgical History  Procedure Laterality Date  . Knee arthroscopy Left 5/06  . Tendon repair Right 3/07    Elbow  . Achilles tendon repair Left 1/08    Dr.Norris  . Achilles tendon repair Right 7/09    Dr.Norris  . Elbow surgery      Family History  Problem Relation Age of Onset  . Arthritis Mother   . High blood pressure Mother   . Diabetes Mother   . High blood pressure Sister   . High blood pressure Father     No Known Allergies  Current Outpatient Prescriptions on File Prior to Visit  Medication Sig Dispense Refill  . albuterol (PROAIR HFA) 108 (90 BASE) MCG/ACT inhaler Inhale 2 puffs into the lungs every 6 (six) hours as needed for wheezing. 1 Inhaler 0  . albuterol (PROVENTIL) (2.5 MG/3ML) 0.083% nebulizer solution Take 3 mLs (2.5 mg total) by nebulization every 6 (six) hours as needed for wheezing. 75 mL 2  . amLODipine (NORVASC) 5 MG tablet Take 1 tablet (5 mg total) by mouth daily. 90 tablet 3  . ammonium lactate (LAC-HYDRIN) 12 % cream Apply topically as needed for dry skin (apply to area bid). 140 g 0  . aspirin 81 MG tablet Take 81 mg by mouth  daily.    . beclomethasone (QVAR) 40 MCG/ACT inhaler Inhale 2 puffs into the lungs 2 (two) times daily. 1 Inhaler 12  . benzonatate (TESSALON) 100 MG capsule Take 1 capsule (100 mg total) by mouth 3 (three) times daily as needed for cough. 21 capsule 0  . Blood Glucose Monitoring Suppl (ONE TOUCH ULTRA SYSTEM KIT) W/DEVICE KIT 1 kit by Does not apply route once. Dx. 250.00 1 each 0  . CINNAMON PO Take 1,000 mg by mouth daily.    Marland Kitchen diltiazem (DILACOR XR) 120 MG 24 hr capsule Take 1 capsule (120 mg total) by mouth daily. 90 capsule 1  . doxycycline (VIBRA-TABS) 100 MG tablet Take 1 tablet (100 mg total) by mouth 2 (two) times daily. 20 tablet 0  . ferrous sulfate 325 (65 FE) MG tablet Take 650 mg by mouth daily with breakfast.     . fluticasone  (FLONASE) 50 MCG/ACT nasal spray Place 2 sprays into both nostrils daily. 16 g 1  . gabapentin (NEURONTIN) 300 MG capsule Take 1 capsule (300 mg total) by mouth 2 (two) times daily. 60 capsule 5  . glucose blood test strip Use as instructed. Pt tests sugars once daily. Dx. 250.00 100 each 12  . guaiFENesin-codeine (ROBITUSSIN AC) 100-10 MG/5ML syrup Take 5 mLs by mouth 3 (three) times daily as needed for cough. 120 mL 0  . insulin regular (HUMULIN R) 100 units/mL injection 3 times a day (just before each meal), 25-30-20 units, and syringes 3/day (Patient taking differently: 3 times a day (just before each meal), 25-25-20 units, and syringes 3/day) 30 mL 11  . Lancets Thin MISC Use as directed. Pt tests sugars once daily. Dx. 250.00 100 each 12  . Multiple Vitamin (MULTIVITAMIN) tablet Take 1 tablet by mouth daily.    . naproxen sodium (ANAPROX) 220 MG tablet Take 220 mg by mouth daily as needed. For pain    . pantoprazole (PROTONIX) 40 MG tablet Take 1 tablet (40 mg total) by mouth daily. 30 tablet 5  . quinapril-hydrochlorothiazide (ACCURETIC) 20-25 MG per tablet Take 1 tablet by mouth daily. 1 tab by mouth daily 90 tablet 1  . Senna (NATURAL VEGETABLE LAXATIVE) 65-325 MG TABS Take 1 tablet by mouth 2 (two) times daily.     Marland Kitchen tiZANidine (ZANAFLEX) 4 MG tablet TAKE 1 TABLET BY MOUTH EVERY 6 (SIX) HOURS AS NEEDED FOR MUSCLE SPASMS. 30 tablet 1  . triamcinolone cream (KENALOG) 0.1 % Apply 1 application topically 2 (two) times daily. 30 g 0  . vitamin C (ASCORBIC ACID) 500 MG tablet Take 1,000 mg by mouth daily.      No current facility-administered medications on file prior to visit.    BP 147/68 mmHg  Pulse 111  Temp(Src) 98.8 F (37.1 C) (Oral)  Ht _0  (1.6 m)  Wt 256 lb 9.6 oz (116.393 kg)  BMI 45.47 kg/m2  SpO2 98%  LMP 08/05/2010       Objective:   Physical Exam  General  Mental Status - Alert. General Appearance - Well groomed. Not in acute distress.  Skin Rashes- No  Rashes.  HEENT Head- Normal. Ear Auditory Canal - Left- Normal. Right - Normal.Tympanic Membrane- Left- Normal. Right- Normal. Eye Sclera/Conjunctiva- Left- Normal. Right- Normal. Nose & Sinuses Nasal Mucosa- Left-  Mild boggy + Congested. Right-  Mild boggy + Congested. No sinus pressure today. Mouth & Throat Lips: Upper Lip- Normal: no dryness, cracking, pallor, cyanosis, or vesicular eruption. Lower Lip-Normal: no dryness, cracking, pallor,  cyanosis or vesicular eruption. Buccal Mucosa- Bilateral- No Aphthous ulcers. Oropharynx- No Discharge or Erythema. Tonsils: Characteristics- Bilateral- No Erythema or Congestion. Size/Enlargement- Bilateral- No enlargement. Discharge- bilateral-None.  Neck Neck- Supple. No Masses.   Chest and Lung Exam Auscultation: Breath Sounds:- even and unlabored,   Cardiovascular Auscultation:Rythm- Regular, rate and rhythm. Murmurs & Other Heart Sounds:Ausculatation of the heart reveal- No Murmurs.  Lymphatic Head & Neck General Head & Neck Lymphatics: Bilateral: Description- No Localized lymphadenopathy.  Skin- rt heel/achilles ares the flaky rash is a lot better. No warmth, no redness. No dc. But scattered blister, bullous type lesion both rt and lt foot. Worse on rt side.# about 8-9. Left side 3.       Assessment & Plan:

## 2014-08-16 NOTE — Assessment & Plan Note (Signed)
Pt had some faint small lesion on hands recently on last visit. But since then scattered type lesion and larger on both feet/ankles. Will refer to dermatologist.  Important to note pt seems to have some very small bullous type lesions on hand before doxy was stared.

## 2014-08-16 NOTE — Assessment & Plan Note (Signed)
Much better but cough is worse. More pnd. Stop benzonatate. Rx hydromet.

## 2014-08-19 ENCOUNTER — Telehealth: Payer: Self-pay | Admitting: Endocrinology

## 2014-08-19 DIAGNOSIS — L301 Dyshidrosis [pompholyx]: Secondary | ICD-10-CM | POA: Diagnosis not present

## 2014-08-19 NOTE — Telephone Encounter (Signed)
Patient stated that she went to the dermatologist he prescribed her prednisone 20 mg for 14 days, please advise her what to do concerning her insulin.

## 2014-08-19 NOTE — Telephone Encounter (Signed)
The risk of increased blood sugar will be present for the 2 weeks you are on the prednisone, and for 1 week after.  Please call if you have a lot of readings over 200.

## 2014-08-19 NOTE — Telephone Encounter (Signed)
Pt advised of note below and voiced understanding.  

## 2014-08-19 NOTE — Telephone Encounter (Signed)
See below and please advise, Pt has not started the medication yet.   Thanks!

## 2014-10-08 ENCOUNTER — Ambulatory Visit (INDEPENDENT_AMBULATORY_CARE_PROVIDER_SITE_OTHER): Payer: Medicare Other | Admitting: Family Medicine

## 2014-10-08 ENCOUNTER — Encounter: Payer: Self-pay | Admitting: Family Medicine

## 2014-10-08 VITALS — BP 150/75 | HR 98 | Temp 97.8°F | Wt 254.0 lb

## 2014-10-08 DIAGNOSIS — J0111 Acute recurrent frontal sinusitis: Secondary | ICD-10-CM

## 2014-10-08 DIAGNOSIS — G894 Chronic pain syndrome: Secondary | ICD-10-CM | POA: Diagnosis not present

## 2014-10-08 DIAGNOSIS — K2100 Gastro-esophageal reflux disease with esophagitis, without bleeding: Secondary | ICD-10-CM

## 2014-10-08 DIAGNOSIS — R059 Cough, unspecified: Secondary | ICD-10-CM

## 2014-10-08 DIAGNOSIS — R05 Cough: Secondary | ICD-10-CM

## 2014-10-08 DIAGNOSIS — L853 Xerosis cutis: Secondary | ICD-10-CM

## 2014-10-08 DIAGNOSIS — I10 Essential (primary) hypertension: Secondary | ICD-10-CM

## 2014-10-08 MED ORDER — AMMONIUM LACTATE 12 % EX CREA: TOPICAL_CREAM | CUTANEOUS | Status: DC | PRN

## 2014-10-08 MED ORDER — QUINAPRIL-HYDROCHLOROTHIAZIDE 20-25 MG PO TABS: 1.0000 | ORAL_TABLET | Freq: Every day | ORAL | Status: DC

## 2014-10-08 MED ORDER — PROMETHAZINE-DM 6.25-15 MG/5ML PO SYRP: 5.0000 mL | ORAL_SOLUTION | Freq: Four times a day (QID) | ORAL | Status: DC | PRN

## 2014-10-08 MED ORDER — TIZANIDINE HCL 4 MG PO TABS: ORAL_TABLET | ORAL | Status: DC

## 2014-10-08 MED ORDER — AMLODIPINE BESYLATE 5 MG PO TABS: 5.0000 mg | ORAL_TABLET | Freq: Every day | ORAL | Status: DC

## 2014-10-08 MED ORDER — GABAPENTIN 300 MG PO CAPS: 300.0000 mg | ORAL_CAPSULE | Freq: Two times a day (BID) | ORAL | Status: DC

## 2014-10-08 MED ORDER — AMOXICILLIN-POT CLAVULANATE 875-125 MG PO TABS: 1.0000 | ORAL_TABLET | Freq: Two times a day (BID) | ORAL | Status: DC

## 2014-10-08 MED ORDER — DILTIAZEM HCL ER 120 MG PO CP24: 120.0000 mg | ORAL_CAPSULE | Freq: Every day | ORAL | Status: DC

## 2014-10-08 MED ORDER — PANTOPRAZOLE SODIUM 40 MG PO TBEC: 40.0000 mg | DELAYED_RELEASE_TABLET | Freq: Every day | ORAL | Status: DC

## 2014-10-08 NOTE — Patient Instructions (Signed)

## 2014-10-08 NOTE — Progress Notes (Signed)
  Subjective:     Carla Jensen is a 54 y.o. female who presents for evaluation of sinus pain. Symptoms include: congestion, fevers, headaches, nasal congestion, post nasal drip, sinus pressure and sore throat. Onset of symptoms was 2 weeks ago. Symptoms have been gradually worsening since that time. Past history is significant for no history of pneumonia or bronchitis. Patient is a non-smoker.  The following portions of the patient's history were reviewed and updated as appropriate: allergies, current medications, past family history, past medical history, past social history, past surgical history and problem list.  Review of Systems Pertinent items are noted in HPI.   Objective:    BP 150/75 mmHg  Pulse 98  Temp(Src) 97.8 F (36.6 C) (Oral)  Wt 254 lb (115.214 kg)  SpO2 97%  LMP 08/05/2010 General appearance: alert, cooperative, appears stated age and no distress Ears: normal TM's and external ear canals both ears Nose: green discharge, moderate congestion, turbinates red, swollen, sinus tenderness bilateral Throat: abnormal findings: mild oropharyngeal erythema and pnd Neck: moderate anterior cervical adenopathy, supple, symmetrical, trachea midline and thyroid not enlarged, symmetric, no tenderness/mass/nodules Lungs: clear to auscultation bilaterally Heart: S1, S2 normal    Assessment:    Acute bacterial sinusitis.    Plan:    Nasal steroids per medication orders. Antihistamines per medication orders. Augmentin per medication orders. f/u prn

## 2014-10-08 NOTE — Progress Notes (Signed)
Pre visit review using our clinic review tool, if applicable. No additional management support is needed unless otherwise documented below in the visit note. 

## 2014-10-25 ENCOUNTER — Ambulatory Visit: Payer: Medicare Other | Admitting: Endocrinology

## 2014-11-13 ENCOUNTER — Ambulatory Visit (INDEPENDENT_AMBULATORY_CARE_PROVIDER_SITE_OTHER): Payer: Medicare Other | Admitting: Endocrinology

## 2014-11-13 ENCOUNTER — Encounter: Payer: Self-pay | Admitting: Endocrinology

## 2014-11-13 VITALS — BP 136/60 | HR 105 | Temp 98.3°F | Ht 63.0 in | Wt 256.0 lb

## 2014-11-13 DIAGNOSIS — E119 Type 2 diabetes mellitus without complications: Secondary | ICD-10-CM

## 2014-11-13 LAB — HEMOGLOBIN A1C: Hgb A1c MFr Bld: 6.3 % (ref 4.6–6.5)

## 2014-11-13 MED ORDER — INSULIN REGULAR HUMAN 100 UNIT/ML IJ SOLN: INTRAMUSCULAR | Status: DC

## 2014-11-13 NOTE — Patient Instructions (Addendum)
check your blood sugar twice a day.  vary the time of day when you check, between before the 3 meals, and at bedtime.  also check if you have symptoms of your blood sugar being too high or too low.  please keep a record of the readings and bring it to your next appointment here.  please call us sooner if your blood sugar goes below 70, or if you have a lot of readings over 200. Please come back for a follow-up appointment in 4 months.  blood tests are being requested for you today.  We'll contact you with results.

## 2014-11-13 NOTE — Progress Notes (Signed)
Subjective:    Patient ID: Carla Jensen, female    DOB: 21-Jan-1961, 54 y.o.   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 Complications: none Therapy: insulin since soon after dx. GDM: never DKA: never Severe hypoglycemia: never Pancreatitis: never Other: she takes multiple daily injections; she cannot afford insulin analogs.  She declines weight-loss surgery Interval history: She last took prednisone 1 month ago.  no cbg record, but states cbg's are lowest in the middle of the night. It is highest at lunch.  pt states she feels well in general. Past Medical History  Diagnosis Date  . Asthma   . Arthritis   . Depression   . GERD (gastroesophageal reflux disease)   . Allergy   . Hypertension   . Chickenpox   . 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 Diabetes mellitus without complication   . Stroke     Past Surgical History  Procedure Laterality Date  . Knee arthroscopy Left 5/06  . Tendon repair Right 3/07    Elbow  . Achilles tendon repair Left 1/08    Dr.Norris  . Achilles tendon repair Right 7/09    Dr.Norris  . Elbow surgery      History   Social History  . Marital Status: Married    Spouse Name: N/A  . Number of Children: N/A  . Years of Education: N/A   Occupational History  . Not on file.   Social History Main Topics  . Smoking status: Never Smoker   . Smokeless tobacco: Never Used  . Alcohol Use: No  . Drug Use: No  . Sexual Activity: Not on file   Other Topics Concern  . Not on file   Social History Narrative    Current Outpatient Prescriptions on File Prior to Visit  Medication Sig Dispense Refill  . albuterol (PROAIR HFA) 108 (90 BASE) MCG/ACT inhaler Inhale 2 puffs into the lungs every 6 (six) hours as needed for wheezing. 1 Inhaler 0  . albuterol (PROVENTIL) (2.5 MG/3ML) 0.083% nebulizer  solution Take 3 mLs (2.5 mg total) by nebulization every 6 (six) hours as needed for wheezing. 75 mL 2  . amLODipine (NORVASC) 5 MG tablet Take 1 tablet (5 mg total) by mouth daily. 90 tablet 3  . ammonium lactate (LAC-HYDRIN) 12 % cream Apply topically as needed for dry skin (apply to area bid). D/C PREVIOUS SCRIPTS FOR THIS MEDICATION 140 g 0  . aspirin 81 MG tablet Take 81 mg by mouth daily.    . Blood Glucose Monitoring Suppl (ONE TOUCH ULTRA SYSTEM KIT) W/DEVICE KIT 1 kit by Does not apply route once. Dx. 250.00 1 each 0  . CINNAMON PO Take 1,000 mg by mouth daily.    Marland Kitchen diltiazem (DILACOR XR) 120 MG 24 hr capsule Take 1 capsule (120 mg total) by mouth daily. 90 capsule 1  . ferrous sulfate 325 (65 FE) MG tablet Take 650 mg by mouth daily with breakfast.     . fluticasone (FLONASE) 50 MCG/ACT nasal spray Place 2 sprays into both nostrils daily. 16 g 1  . gabapentin (NEURONTIN) 300 MG capsule Take 1 capsule (300 mg total) by mouth 2 (two) times daily. 60 capsule 5  . glucose blood test strip Use as instructed. Pt tests sugars once daily. Dx. 250.00 100 each 12  . Lancets Thin MISC Use as directed. Pt tests  sugars once daily. Dx. 250.00 100 each 12  . Multiple Vitamin (MULTIVITAMIN) tablet Take 1 tablet by mouth daily.    . naproxen sodium (ANAPROX) 220 MG tablet Take 220 mg by mouth daily as needed. For pain    . pantoprazole (PROTONIX) 40 MG tablet Take 1 tablet (40 mg total) by mouth daily. 30 tablet 5  . promethazine-dextromethorphan (PROMETHAZINE-DM) 6.25-15 MG/5ML syrup Take 5 mLs by mouth 4 (four) times daily as needed for cough. 118 mL 0  . quinapril-hydrochlorothiazide (ACCURETIC) 20-25 MG per tablet Take 1 tablet by mouth daily. 90 tablet 1  . Senna (NATURAL VEGETABLE LAXATIVE) 65-325 MG TABS Take 1 tablet by mouth 2 (two) times daily.     Marland Kitchen tiZANidine (ZANAFLEX) 4 MG tablet TAKE 1 TABLET BY MOUTH EVERY 6 (SIX) HOURS AS NEEDED FOR MUSCLE SPASMS. 30 tablet 1  . triamcinolone cream  (KENALOG) 0.1 % Apply 1 application topically 2 (two) times daily. 30 g 0  . vitamin C (ASCORBIC ACID) 500 MG tablet Take 1,000 mg by mouth daily.      No current facility-administered medications on file prior to visit.    No Known Allergies  Family History  Problem Relation Age of Onset  . Arthritis Mother   . High blood pressure Mother   . Diabetes Mother   . High blood pressure Sister   . High blood pressure Father     BP 136/60 mmHg  Pulse 105  Temp(Src) 98.3 F (36.8 C) (Oral)  Ht $R'5\' 3"'Pp$  (1.6 m)  Wt 256 lb (116.121 kg)  BMI 45.36 kg/m2  SpO2 96%  LMP 08/05/2010    Review of Systems She denies hypoglycemia    Objective:   Physical Exam VITAL SIGNS:  See vs page GENERAL: no distress Pulses: dorsalis pedis intact bilat.   MSK: no deformity of the feet CV: 1+ bilat leg edema.  Skin:  no ulcer on the feet.  normal color and temp on the feet. Neuro: sensation is intact to touch on the feet  Lab Results  Component Value Date   HGBA1C 6.3 11/13/2014       Assessment & Plan:  DM: overcontrolled  Patient is advised the following: Patient Instructions  check your blood sugar twice a day.  vary the time of day when you check, between before the 3 meals, and at bedtime.  also check if you have symptoms of your blood sugar being too high or too low.  please keep a record of the readings and bring it to your next appointment here.  please call us sooner if your blood sugar goes below 70, or if you have a lot of readings over 200. Please come back for a follow-up appointment in 4 months.  blood tests are being requested for you today.  We'll contact you with results.

## 2014-12-10 ENCOUNTER — Other Ambulatory Visit: Payer: Self-pay | Admitting: Family Medicine

## 2015-01-11 ENCOUNTER — Other Ambulatory Visit: Payer: Self-pay | Admitting: Family Medicine

## 2015-02-16 ENCOUNTER — Other Ambulatory Visit: Payer: Self-pay | Admitting: Family Medicine

## 2015-02-27 ENCOUNTER — Telehealth: Payer: Self-pay

## 2015-02-27 NOTE — Telephone Encounter (Signed)
Called to schedule Medicare Wellness Visit with [facility] Coach.  Left a message for call back.  

## 2015-03-14 NOTE — Telephone Encounter (Signed)
Appointment scheduled for Friday, Sept 9 th.

## 2015-03-20 ENCOUNTER — Other Ambulatory Visit: Payer: Self-pay

## 2015-03-20 DIAGNOSIS — I10 Essential (primary) hypertension: Secondary | ICD-10-CM

## 2015-03-20 MED ORDER — DILTIAZEM HCL ER 120 MG PO CP24: 120.0000 mg | ORAL_CAPSULE | Freq: Every day | ORAL | Status: DC

## 2015-03-20 MED ORDER — DILTIAZEM HCL ER COATED BEADS 120 MG PO CP24: ORAL_CAPSULE | ORAL | Status: DC

## 2015-03-20 NOTE — Telephone Encounter (Signed)
I called the pharmacy and corrected the Previous Rx.   KP

## 2015-03-21 ENCOUNTER — Ambulatory Visit: Payer: Medicare Other

## 2015-04-10 ENCOUNTER — Other Ambulatory Visit: Payer: Self-pay | Admitting: Family Medicine

## 2015-04-11 ENCOUNTER — Encounter: Payer: Self-pay | Admitting: Family Medicine

## 2015-04-11 ENCOUNTER — Ambulatory Visit (INDEPENDENT_AMBULATORY_CARE_PROVIDER_SITE_OTHER): Payer: Medicare Other | Admitting: Family Medicine

## 2015-04-11 VITALS — BP 158/84 | HR 89 | Temp 97.9°F | Resp 16 | Ht 63.0 in | Wt 263.0 lb

## 2015-04-11 DIAGNOSIS — J018 Other acute sinusitis: Secondary | ICD-10-CM

## 2015-04-11 MED ORDER — AZITHROMYCIN 250 MG PO TABS: ORAL_TABLET | ORAL | Status: DC

## 2015-04-11 NOTE — Patient Instructions (Signed)
Buy OTC generic saline nasal spray. Consider trying generic OTC allegra or zyrtec WITHOUT decongestant.

## 2015-04-11 NOTE — Progress Notes (Signed)
Pre visit review using our clinic review tool, if applicable. No additional management support is needed unless otherwise documented below in the visit note. 

## 2015-04-11 NOTE — Progress Notes (Signed)
   Carla Jensen is a 54 y.o. female who presents for an Initial Medicare Annual Wellness Visit.  Annual Wellness Visit was not completed due to acute symptoms.  Pt c/o chest congestion, cough, sinus pressure, and fatigue that has worsen since the beginning of this week.   Given patient's exacerbation of symptoms, history of asthma, DM, and HTN, she was advised to be seen today by a provider.  No openings available in our office today. Therefore, an appointment was scheduled with Dr. Anitra Lauth at [facility] @ 11:30 am.

## 2015-04-11 NOTE — Progress Notes (Signed)
OFFICE NOTE  04/11/2015  CC:  Chief Complaint  Patient presents with  . URI    x 5 days   HPI: Patient is a 54 y.o. African-American female who is here for "URI". Onset 5 d/a nasal congestion, pressure in face/head, cough, chest tightness.  No fever.  No wheezing is heard.  Scratchy throat but not sore.  Feels drained.   Lately has been using alleve for HA, albut inhaler 1-2 times per day and this helps her chest tightness. No nasal sprays and no antihistamines. Nonsmoker.   Pertinent PMH:  PMH and PSH reviewed.  MEDS:  Outpatient Prescriptions Prior to Visit  Medication Sig Dispense Refill  . albuterol (PROAIR HFA) 108 (90 BASE) MCG/ACT inhaler Inhale 2 puffs into the lungs every 6 (six) hours as needed for wheezing. 1 Inhaler 0  . albuterol (PROVENTIL) (2.5 MG/3ML) 0.083% nebulizer solution Take 3 mLs (2.5 mg total) by nebulization every 6 (six) hours as needed for wheezing. 75 mL 2  . amLODipine (NORVASC) 5 MG tablet Take 1 tablet (5 mg total) by mouth daily. 90 tablet 3  . aspirin 81 MG tablet Take 81 mg by mouth daily.    . Blood Glucose Monitoring Suppl (ONE TOUCH ULTRA SYSTEM KIT) W/DEVICE KIT 1 kit by Does not apply route once. Dx. 250.00 1 each 0  . CINNAMON PO Take 1,000 mg by mouth daily.    Marland Kitchen diltiazem (CARDIZEM CD) 120 MG 24 hr capsule TAKE 1 CAPSULE (120 MG TOTAL) BY MOUTH DAILY. 90 capsule 0  . diltiazem (DILACOR XR) 120 MG 24 hr capsule Take 1 capsule (120 mg total) by mouth daily. 90 capsule 1  . ferrous sulfate 325 (65 FE) MG tablet Take 650 mg by mouth daily with breakfast.     . fluticasone (FLONASE) 50 MCG/ACT nasal spray Place 2 sprays into both nostrils daily. 16 g 1  . gabapentin (NEURONTIN) 300 MG capsule Take 1 capsule (300 mg total) by mouth 2 (two) times daily. 60 capsule 5  . glucose blood test strip Use as instructed. Pt tests sugars once daily. Dx. 250.00 100 each 12  . insulin regular (HUMULIN R) 100 units/mL injection 3 times a day (just before each  meal), 25-25-20 units, and syringes 3/day (Patient taking differently: 3 times a day (just before each meal), 25-25-15 units, and syringes 3/day) 30 mL 11  . Lancets Thin MISC Use as directed. Pt tests sugars once daily. Dx. 250.00 100 each 12  . Multiple Vitamin (MULTIVITAMIN) tablet Take 1 tablet by mouth daily.    . naproxen sodium (ANAPROX) 220 MG tablet Take 220 mg by mouth daily as needed. For pain    . pantoprazole (PROTONIX) 40 MG tablet Take 1 tablet (40 mg total) by mouth daily. 30 tablet 5  . promethazine-dextromethorphan (PROMETHAZINE-DM) 6.25-15 MG/5ML syrup Take 5 mLs by mouth 4 (four) times daily as needed for cough. 118 mL 0  . quinapril-hydrochlorothiazide (ACCURETIC) 20-25 MG per tablet Take 1 tablet by mouth daily. 90 tablet 1  . Senna (NATURAL VEGETABLE LAXATIVE) 65-325 MG TABS Take 1 tablet by mouth 2 (two) times daily.     Marland Kitchen tiZANidine (ZANAFLEX) 4 MG tablet TAKE 1 TABLET BY MOUTH EVERY 6 (SIX) HOURS AS NEEDED FOR MUSCLE SPASMS. 30 tablet 1  . vitamin C (ASCORBIC ACID) 500 MG tablet Take 1,000 mg by mouth daily.     Marland Kitchen ammonium lactate (LAC-HYDRIN) 12 % cream Apply topically as needed for dry skin (apply to area bid). D/C  PREVIOUS SCRIPTS FOR THIS MEDICATION (Patient not taking: Reported on 04/11/2015) 140 g 0  . triamcinolone cream (KENALOG) 0.1 % Apply 1 application topically 2 (two) times daily. (Patient not taking: Reported on 04/11/2015) 30 g 0   No facility-administered medications prior to visit.    PE: Blood pressure 169/76, pulse 89, temperature 97.9 F (36.6 C), temperature source Oral, resp. rate 16, height $RemoveBe'5\' 3"'lTJQezfYe$  (1.6 m), weight 263 lb (119.296 kg), last menstrual period 08/05/2010, SpO2 99 %. BP recheck 158/84 VS: noted--normal. Gen: alert, NAD, NONTOXIC APPEARING. HEENT: eyes without injection, drainage, or swelling.  Ears: EACs clear, TMs with normal light reflex and landmarks.  Nose: Clear rhinorrhea, with some dried, crusty exudate adherent to mildly injected  mucosa.  No purulent d/c.  Mild L>R paranasal sinus TTP.  No facial swelling.  Throat and mouth without focal lesion.  No pharyngial swelling, erythema, or exudate.   Neck: supple, no LAD.   LUNGS: CTA bilat, nonlabored resps.   CV: RRR, no m/r/g. EXT: no c/c/e SKIN: no rash   IMPRESSION AND PLAN:  Acute sinusitis: allergic + infectious component (? Bacterial). Z-pack. Saline nasal spray. Try OTC nonsedating antihistamine w/out decongestant. Push fluids. Rest. No sign of asthma flare requiring systemic steroids at this time.  An After Visit Summary was printed and given to the patient.  FOLLOW UP: prn

## 2015-04-12 ENCOUNTER — Other Ambulatory Visit: Payer: Self-pay | Admitting: Family Medicine

## 2015-04-19 ENCOUNTER — Other Ambulatory Visit: Payer: Self-pay | Admitting: Family Medicine

## 2015-04-24 ENCOUNTER — Ambulatory Visit (INDEPENDENT_AMBULATORY_CARE_PROVIDER_SITE_OTHER): Payer: Medicare Other

## 2015-04-24 VITALS — BP 148/70 | HR 98 | Ht 62.0 in | Wt 264.0 lb

## 2015-04-24 DIAGNOSIS — Z0000 Encounter for general adult medical examination without abnormal findings: Secondary | ICD-10-CM | POA: Diagnosis not present

## 2015-04-24 NOTE — Progress Notes (Signed)
reviewed

## 2015-04-24 NOTE — Progress Notes (Signed)
Subjective:   Carla Jensen is a 54 y.o. female who presents for an Initial Medicare Annual Wellness Visit.  Review of Systems: No ROS  Sleep patterns:   Sleeps approximately 5-6 hours per night/does not feel rested/rarely wakes up to go to the bathroom.   Home Safety/Smoke Alarms:  Feels safe at home.  Lives at home with husband and 1 great grandchild.  Smoke alarms present.  Lives in a safe neighborhood. Firearm Safety: No firearm in home.  Seat Belt Safety/Bike Helmet:  Always wears seat belt.   Counseling:   Eye Exam- Plans to schedule.   Dental- Hardly ever goes.  Wear upper and lower dentures. Fit properly.   Female:  Pap-12/11/12- DUE     Mammo-01/11/13-DUE   CCS- 02/08/13-repeat in 10 years   Immunization: Flu vaccine Due    Pt encouraged to schedule PAP and Mammogram.    Objective:    Today's Vitals   04/24/15 0945  BP: 148/70  Pulse: 98  Height: _0  (1.575 m)  Weight: 264 lb (119.75 kg)  SpO2: 98%  PainSc: 0-No pain    Current Medications (verified) Outpatient Encounter Prescriptions as of 04/24/2015  Medication Sig  . albuterol (PROAIR HFA) 108 (90 BASE) MCG/ACT inhaler Inhale 2 puffs into the lungs every 6 (six) hours as needed for wheezing.  Marland Kitchen albuterol (PROVENTIL) (2.5 MG/3ML) 0.083% nebulizer solution Take 3 mLs (2.5 mg total) by nebulization every 6 (six) hours as needed for wheezing.  Marland Kitchen amLODipine (NORVASC) 5 MG tablet Take 1 tablet (5 mg total) by mouth daily.  Marland Kitchen aspirin 81 MG tablet Take 81 mg by mouth daily.  . Blood Glucose Monitoring Suppl (ONE TOUCH ULTRA SYSTEM KIT) W/DEVICE KIT 1 kit by Does not apply route once. Dx. 250.00  . CINNAMON PO Take 1,000 mg by mouth daily.  . ferrous sulfate 325 (65 FE) MG tablet Take 650 mg by mouth daily with breakfast.   . fluticasone (FLONASE) 50 MCG/ACT nasal spray Place 2 sprays into both nostrils daily.  Marland Kitchen gabapentin (NEURONTIN) 300 MG capsule TAKE 1 CAPSULE (300 MG TOTAL) BY MOUTH 2 (TWO) TIMES DAILY.  Marland Kitchen  glucose blood test strip Use as instructed. Pt tests sugars once daily. Dx. 250.00  . insulin regular (HUMULIN R) 100 units/mL injection 3 times a day (just before each meal), 25-25-20 units, and syringes 3/day (Patient taking differently: 3 times a day (just before each meal), 25-25-15 units, and syringes 3/day)  . Lancets Thin MISC Use as directed. Pt tests sugars once daily. Dx. 250.00  . Multiple Vitamin (MULTIVITAMIN) tablet Take 1 tablet by mouth daily.  . naproxen sodium (ANAPROX) 220 MG tablet Take 220 mg by mouth daily as needed. For pain  . pantoprazole (PROTONIX) 40 MG tablet TAKE 1 TABLET (40 MG TOTAL) BY MOUTH DAILY.  Marland Kitchen quinapril-hydrochlorothiazide (ACCURETIC) 20-25 MG per tablet Take 1 tablet by mouth daily.  . Senna (NATURAL VEGETABLE LAXATIVE) 65-325 MG TABS Take 1 tablet by mouth 2 (two) times daily.   Marland Kitchen tiZANidine (ZANAFLEX) 4 MG tablet TAKE 1 TABLET BY MOUTH EVERY 6 (SIX) HOURS AS NEEDED FOR MUSCLE SPASMS.  . vitamin C (ASCORBIC ACID) 500 MG tablet Take 1,000 mg by mouth daily.   Marland Kitchen diltiazem (CARDIZEM CD) 120 MG 24 hr capsule TAKE 1 CAPSULE (120 MG TOTAL) BY MOUTH DAILY.  Marland Kitchen diltiazem (DILACOR XR) 120 MG 24 hr capsule Take 1 capsule (120 mg total) by mouth daily.  . promethazine-dextromethorphan (PROMETHAZINE-DM) 6.25-15 MG/5ML syrup Take 5  mLs by mouth 4 (four) times daily as needed for cough. (Patient not taking: Reported on 04/24/2015)  . [DISCONTINUED] azithromycin (ZITHROMAX) 250 MG tablet 2 tabs po qd x 1d, then 1 tab po qd x 4d (Patient not taking: Reported on 04/24/2015)   No facility-administered encounter medications on file as of 04/24/2015.    Allergies (verified) Review of patient's allergies indicates no known allergies.   History: Past Medical History  Diagnosis Date  . Asthma   . Arthritis   . Depression   . GERD (gastroesophageal reflux disease)   . Allergy   . Hypertension   . Chickenpox   . 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 Diabetes mellitus without complication ([place])   . Stroke [facility])    Past Surgical History  Procedure Laterality Date  . Knee arthroscopy Left 5/06  . Tendon repair Right 3/07    Elbow  . Achilles tendon repair Left 1/08    Dr.Norris  . Achilles tendon repair Right 7/09    Dr.Norris  . Elbow surgery     Family History  Problem Relation Age of Onset  . Arthritis Mother   . High blood pressure Mother   . Diabetes Mother   . High blood pressure Sister   . High blood pressure Father    Social History   Occupational History  . Not on file.   Social History Main Topics  . Smoking status: Never Smoker   . Smokeless tobacco: Never Used  . Alcohol Use: No  . Drug Use: No  . Sexual Activity:    Partners: Male    Tobacco Counseling Counseling given: Not Answered   Activities of Daily Living In your present state of health, do you have any difficulty performing the following activities: 04/24/2015  Hearing? N  Vision? N  Difficulty concentrating or making decisions? Y  Walking or climbing stairs? Y  Dressing or bathing? N  Doing errands, shopping? N  Preparing Food and eating ? N  Using the Toilet? N  In the past six months, have you accidently leaked urine? N  Do you have problems with loss of bowel control? N  Managing your Medications? N  Managing your Finances? N  Housekeeping or managing your Housekeeping? N    Immunizations and Health Maintenance Immunization History  Administered Date(s) Administered  . Pneumococcal Polysaccharide-23 10/19/2013  . Tdap 12/12/2007   Health Maintenance Due  Topic Date Due  . Hepatitis C Screening  06/15/1961  . OPHTHALMOLOGY EXAM  03/23/2012  . MAMMOGRAM  01/12/2015  . URINE MICROALBUMIN  01/23/2015  . INFLUENZA VACCINE  02/10/2015    Patient Care Team: Rosalita Chessman, DO as PCP - General (Family Medicine) Renato Shin, MD as Consulting Physician (Endocrinology) Jerene Bears, MD as  Consulting Physician (Gastroenterology)  Indicate any recent Medical Services you may have received from other than [facility] providers in the past year (date may be approximate).     Assessment:   This is a routine wellness examination for Angi.  Hypertension- BP slight elevated.  States taking medications as prescribed.  Followed by Dr. Etter Sjogren.    Asthma- Asymptomatic. Recently on antibx for acute sinusitis. Completed course. Symptoms improved.  Still has cough.  Followed by Dr. Etter Sjogren.   Diabetes- Last A1c-6.3. States taking meds as prescribed.  Followed by Dr. Loanne Drilling.  Encouraged to limit simple carb intake and to exercise as tolerated.  Obesity- BMI-48.27.  Encouraged healthy eating and exercise as tolerated.  Hearing/Vision screen  Hearing Screening   _0  _1  _2  _3  _4  _5  _6   Right ear:   Pass Pass Pass Pass   Left ear:   Pass Pass Pass Pass   Comments: No changes in hearing.   Vision Screening Comments: Noticed changes in vision. Wears corrective glasses and reading glasses.  Last eye exam:  2 years ago- [facility]  Dietary issues and exercise activities discussed: Current Exercise Habits:: The patient does not participate in regular exercise at present  Diet:  Mostly cook, but may eat out 3 times per week.  High sodium intake.  Drinks water.  No caffeine.  Tries to watch sugar intake.    Goals    . Lose 30 lbs.       By eating healthy-Watching sodium and sugar intake.   Stop eating when full.  Keeping a food diary. Water aerobics.       Depression Screen PHQ 2/9 Scores 04/24/2015 12/11/2012  PHQ - 2 Score 0 0    Fall Risk Fall Risk  04/24/2015  Falls in the past year? No    Cognitive Function: MMSE - Mini Mental State Exam 04/24/2015  Orientation to time 5  Orientation to Place 5  Registration 3  Attention/ Calculation 5  Recall 3  Language- name 2 objects 2  Language- repeat 1  Language- follow 3 step command 3  Language-  read & follow direction 1  Write a sentence 1  Copy design 1  Total score 30    Screening Tests Health Maintenance  Topic Date Due  . Hepatitis C Screening  [birth year]  . OPHTHALMOLOGY EXAM  03/23/2012  . MAMMOGRAM  01/12/2015  . URINE MICROALBUMIN  01/23/2015  . INFLUENZA VACCINE  02/10/2015  . HIV Screening  10/08/2015 (Originally 09/05/1975)  . HEMOGLOBIN A1C  05/16/2015  . FOOT EXAM  11/13/2015  . PAP SMEAR  12/12/2015  . TETANUS/TDAP  12/11/2017  . PNEUMOCOCCAL POLYSACCHARIDE VACCINE (2) 10/20/2018  . COLONOSCOPY  02/09/2023      Plan:  Schedule Pap, mammogram and eye exam.  Receive flu shot at next visit.  Make small changes to diet and lifestyle.  Please see below to what you have committed to:   Goals    . Lose 30 lbs.       By eating healthy-Watching sodium and sugar intake.   Stop eating when full.  Keeping a food diary. Water aerobics.       Follow with Dr. Etter Sjogren as scheduled.    During the course of the visit, Graciella was educated and counseled about the following appropriate screening and preventive services:   Vaccines to include Pneumoccal, Influenza, Hepatitis B, Td, Zostavax, HCV  Electrocardiogram  Cardiovascular disease screening  Colorectal cancer screening  Bone density screening  Diabetes screening  Glaucoma screening  Mammography/PAP  Nutrition counseling  Smoking cessation counseling  Patient Instructions (the written plan) were given to the patient.    Rudene Anda, RN   04/24/2015

## 2015-04-24 NOTE — Progress Notes (Signed)
Pre visit review using our clinic review tool, if applicable. No additional management support is needed unless otherwise documented below in the visit note. 

## 2015-04-24 NOTE — Patient Instructions (Addendum)
Schedule Pap, mammogram and eye exam.  Receive flu shot at next visit.  Make small changes to diet and lifestyle.  Please see below to what you have committed to:   Goals    . Lose 30 lbs.       By eating healthy-Watching sodium and sugar intake.   Stop eating when full.  Keeping a food diary. Water aerobics.      Health Maintenance, Female Adopting a healthy lifestyle and getting preventive care can go a long way to promote health and wellness. Talk with your health care provider about what schedule of regular examinations is right for you. This is a good chance for you to check in with your provider about disease prevention and staying healthy. In between checkups, there are plenty of things you can do on your own. Experts have done a lot of research about which lifestyle changes and preventive measures are most likely to keep you healthy. Ask your health care provider for more information. WEIGHT AND DIET  Eat a healthy diet  Be sure to include plenty of vegetables, fruits, low-fat dairy products, and lean protein.  Do not eat a lot of foods high in solid fats, added sugars, or salt.  Get regular exercise. This is one of the most important things you can do for your health.  Most adults should exercise for at least 150 minutes each week. The exercise should increase your heart rate and make you sweat (moderate-intensity exercise).  Most adults should also do strengthening exercises at least twice a week. This is in addition to the moderate-intensity exercise.  Maintain a healthy weight  Body mass index (BMI) is a measurement that can be used to identify possible weight problems. It estimates body fat based on height and weight. Your health care provider can help determine your BMI and help you achieve or maintain a healthy weight.  For females 56 years of age and older:   A BMI below 18.5 is considered underweight.  A BMI of 18.5 to 24.9 is normal.  A BMI of 25 to 29.9 is  considered overweight.  A BMI of 30 and above is considered obese.  Watch levels of cholesterol and blood lipids  You should start having your blood tested for lipids and cholesterol at 56 years of age, then have this test every 5 years.  You may need to have your cholesterol levels checked more often if:  Your lipid or cholesterol levels are high.  You are older than 56 years of age.  You are at high risk for heart disease.  CANCER SCREENING   Lung Cancer  Lung cancer screening is recommended for adults 39-54 years old who are at high risk for lung cancer because of a history of smoking.  A yearly low-dose CT scan of the lungs is recommended for people who:  Currently smoke.  Have quit within the past 15 years.  Have at least a 30-pack-year history of smoking. A pack year is smoking an average of one pack of cigarettes a day for 1 year.  Yearly screening should continue until it has been 15 years since you quit.  Yearly screening should stop if you develop a health problem that would prevent you from having lung cancer treatment.  Breast Cancer  Practice breast self-awareness. This means understanding how your breasts normally appear and feel.  It also means doing regular breast self-exams. Let your health care provider know about any changes, no matter how small.  If you  are in your 20s or 30s, you should have a clinical breast exam (CBE) by a health care provider every 1-3 years as part of a regular health exam.  If you are 44 or older, have a CBE every year. Also consider having a breast X-ray (mammogram) every year.  If you have a family history of breast cancer, talk to your health care provider about genetic screening.  If you are at high risk for breast cancer, talk to your health care provider about having an MRI and a mammogram every year.  Breast cancer gene (BRCA) assessment is recommended for women who have family members with BRCA-related cancers.  BRCA-related cancers include:  Breast.  Ovarian.  Tubal.  Peritoneal cancers.  Results of the assessment will determine the need for genetic counseling and BRCA1 and BRCA2 testing. Cervical Cancer Your health care provider may recommend that you be screened regularly for cancer of the pelvic organs (ovaries, uterus, and vagina). This screening involves a pelvic examination, including checking for microscopic changes to the surface of your cervix (Pap test). You may be encouraged to have this screening done every 3 years, beginning at age 49.  For women ages 6-65, health care providers may recommend pelvic exams and Pap testing every 3 years, or they may recommend the Pap and pelvic exam, combined with testing for human papilloma virus (HPV), every 5 years. Some types of HPV increase your risk of cervical cancer. Testing for HPV may also be done on women of any age with unclear Pap test results.  Other health care providers may not recommend any screening for nonpregnant women who are considered low risk for pelvic cancer and who do not have symptoms. Ask your health care provider if a screening pelvic exam is right for you.  If you have had past treatment for cervical cancer or a condition that could lead to cancer, you need Pap tests and screening for cancer for at least 20 years after your treatment. If Pap tests have been discontinued, your risk factors (such as having a new sexual partner) need to be reassessed to determine if screening should resume. Some women have medical problems that increase the chance of getting cervical cancer. In these cases, your health care provider may recommend more frequent screening and Pap tests. Colorectal Cancer  This type of cancer can be detected and often prevented.  Routine colorectal cancer screening usually begins at 56 years of age and continues through 56 years of age.  Your health care provider may recommend screening at an earlier age if you  have risk factors for colon cancer.  Your health care provider may also recommend using home test kits to check for hidden blood in the stool.  A small camera at the end of a tube can be used to examine your colon directly (sigmoidoscopy or colonoscopy). This is done to check for the earliest forms of colorectal cancer.  Routine screening usually begins at age 30.  Direct examination of the colon should be repeated every 5-10 years through 56 years of age. However, you may need to be screened more often if early forms of precancerous polyps or small growths are found. Skin Cancer  Check your skin from head to toe regularly.  Tell your health care provider about any new moles or changes in moles, especially if there is a change in a mole's shape or color.  Also tell your health care provider if you have a mole that is larger than the size of  a pencil eraser.  Always use sunscreen. Apply sunscreen liberally and repeatedly throughout the day.  Protect yourself by wearing long sleeves, pants, a wide-brimmed hat, and sunglasses whenever you are outside. HEART DISEASE, DIABETES, AND HIGH BLOOD PRESSURE   High blood pressure causes heart disease and increases the risk of stroke. High blood pressure is more likely to develop in:  People who have blood pressure in the high end of the normal range (130-139/85-89 mm Hg).  People who are overweight or obese.  People who are African American.  If you are 76-57 years of age, have your blood pressure checked every 3-5 years. If you are 21 years of age or older, have your blood pressure checked every year. You should have your blood pressure measured twice--once when you are at a hospital or clinic, and once when you are not at a hospital or clinic. Record the average of the two measurements. To check your blood pressure when you are not at a hospital or clinic, you can use:  An automated blood pressure machine at a pharmacy.  A home blood pressure  monitor.  If you are between 59 years and 13 years old, ask your health care provider if you should take aspirin to prevent strokes.  Have regular diabetes screenings. This involves taking a blood sample to check your fasting blood sugar level.  If you are at a normal weight and have a low risk for diabetes, have this test once every three years after 56 years of age.  If you are overweight and have a high risk for diabetes, consider being tested at a younger age or more often. PREVENTING INFECTION  Hepatitis B  If you have a higher risk for hepatitis B, you should be screened for this virus. You are considered at high risk for hepatitis B if:  You were born in a country where hepatitis B is common. Ask your health care provider which countries are considered high risk.  Your parents were born in a high-risk country, and you have not been immunized against hepatitis B (hepatitis B vaccine).  You have HIV or AIDS.  You use needles to inject street drugs.  You live with someone who has hepatitis B.  You have had sex with someone who has hepatitis B.  You get hemodialysis treatment.  You take certain medicines for conditions, including cancer, organ transplantation, and autoimmune conditions. Hepatitis C  Blood testing is recommended for:  Everyone born from [birth year] through 1967.  Anyone with known risk factors for hepatitis C. Sexually transmitted infections (STIs)  You should be screened for sexually transmitted infections (STIs) including gonorrhea and chlamydia if:  You are sexually active and are younger than 56 years of age.  You are older than 56 years of age and your health care provider tells you that you are at risk for this type of infection.  Your sexual activity has changed since you were last screened and you are at an increased risk for chlamydia or gonorrhea. Ask your health care provider if you are at risk.  If you do not have HIV, but are at risk, it may be  recommended that you take a prescription medicine daily to prevent HIV infection. This is called pre-exposure prophylaxis (PrEP). You are considered at risk if:  You are sexually active and do not regularly use condoms or know the HIV status of your partner(s).  You take drugs by injection.  You are sexually active with a partner who has HIV.  Talk with your health care provider about whether you are at high risk of being infected with HIV. If you choose to begin PrEP, you should first be tested for HIV. You should then be tested every 3 months for as long as you are taking PrEP.  PREGNANCY   If you are premenopausal and you may become pregnant, ask your health care provider about preconception counseling.  If you may become pregnant, take 400 to 800 micrograms (mcg) of folic acid every day.  If you want to prevent pregnancy, talk to your health care provider about birth control (contraception). OSTEOPOROSIS AND MENOPAUSE   Osteoporosis is a disease in which the bones lose minerals and strength with aging. This can result in serious bone fractures. Your risk for osteoporosis can be identified using a bone density scan.  If you are 80 years of age or older, or if you are at risk for osteoporosis and fractures, ask your health care provider if you should be screened.  Ask your health care provider whether you should take a calcium or vitamin D supplement to lower your risk for osteoporosis.  Menopause may have certain physical symptoms and risks.  Hormone replacement therapy may reduce some of these symptoms and risks. Talk to your health care provider about whether hormone replacement therapy is right for you.  HOME CARE INSTRUCTIONS   Schedule regular health, dental, and eye exams.  Stay current with your immunizations.   Do not use any tobacco products including cigarettes, chewing tobacco, or electronic cigarettes.  If you are pregnant, do not drink alcohol.  If you are  breastfeeding, limit how much and how often you drink alcohol.  Limit alcohol intake to no more than 1 drink per day for nonpregnant women. One drink equals 12 ounces of beer, 5 ounces of wine, or 1 ounces of hard liquor.  Do not use street drugs.  Do not share needles.  Ask your health care provider for help if you need support or information about quitting drugs.  Tell your health care provider if you often feel depressed.  Tell your health care provider if you have ever been abused or do not feel safe at home.   This information is not intended to replace advice given to you by your health care provider. Make sure you discuss any questions you have with your health care provider.   Document Released: 01/11/2011 Document Revised: 07/19/2014 Document Reviewed: 05/30/2013 Elsevier Interactive Patient Education 2016 [place] A mammogram is an X-ray of the breasts that is done to check for abnormal changes. This procedure can screen for and detect any changes that may suggest breast cancer. A mammogram can also identify other changes and variations in the breast, such as:  Inflammation of the breast tissue (mastitis).  An infected area that contains a collection of pus (abscess).  A fluid-filled sac (cyst).  Fibrocystic changes. This is when breast tissue becomes denser, which can make the tissue feel rope-like or uneven under the skin.  Tumors that are not cancerous (benign). LET [facility] CARE PROVIDER KNOW ABOUT:  Any allergies you have.  If you have breast implants.  If you have had previous breast disease, biopsy, or surgery.  If you are breastfeeding.  Any possibility that you could be pregnant, if this applies.  If you are younger than age 33.  If you have a family history of breast cancer. RISKS AND COMPLICATIONS Generally, this is a safe procedure. However, problems may occur, including:  Exposure to radiation. Radiation levels are very low  with this test.  The results being misinterpreted.  The need for further tests.  The inability of the mammogram to detect certain cancers. BEFORE THE PROCEDURE  Schedule your test about 1-2 weeks after your menstrual period. This is usually when your breasts are the least tender.  If you have had a mammogram done at a different facility in the past, get the mammogram X-rays or have them sent to your current exam facility in order to compare them.  Wash your breasts and under your arms the day of the test.  Do not wear deodorants, perfumes, lotions, or powders anywhere on your body on the day of the test.  Remove any jewelry from your neck.  Wear clothes that you can change into and out of easily. PROCEDURE  You will undress from the waist up and put on a gown.  You will stand in front of the X-ray machine.  Each breast will be placed between two plastic or glass plates. The plates will compress your breast for a few seconds. Try to stay as relaxed as possible during the procedure. This does not cause any harm to your breasts and any discomfort you feel will be very brief.  X-rays will be taken from different angles of each breast. The procedure may vary among health care providers and hospitals. AFTER THE PROCEDURE  The mammogram will be examined by a specialist (radiologist).  You may need to repeat certain parts of the test, depending on the quality of the images. This is commonly done if the radiologist needs a better view of the breast tissue.  Ask when your test results will be ready. Make sure you get your test results.  You may resume your normal activities.   This information is not intended to replace advice given to you by your health care provider. Make sure you discuss any questions you have with your health care provider.   Document Released: 06/25/2000 Document Revised: 03/19/2015 Document Reviewed: 09/06/2014 Elsevier Interactive Patient Education [employer]

## 2015-04-28 ENCOUNTER — Ambulatory Visit (INDEPENDENT_AMBULATORY_CARE_PROVIDER_SITE_OTHER): Payer: Medicare Other | Admitting: Family Medicine

## 2015-04-28 ENCOUNTER — Encounter: Payer: Self-pay | Admitting: Family Medicine

## 2015-04-28 ENCOUNTER — Encounter (INDEPENDENT_AMBULATORY_CARE_PROVIDER_SITE_OTHER): Payer: Self-pay

## 2015-04-28 VITALS — BP 127/82 | HR 85 | Temp 98.3°F | Ht 62.0 in | Wt 266.6 lb

## 2015-04-28 DIAGNOSIS — Z1231 Encounter for screening mammogram for malignant neoplasm of breast: Secondary | ICD-10-CM | POA: Diagnosis not present

## 2015-04-28 DIAGNOSIS — Z0000 Encounter for general adult medical examination without abnormal findings: Secondary | ICD-10-CM

## 2015-04-28 DIAGNOSIS — R601 Generalized edema: Secondary | ICD-10-CM

## 2015-04-28 DIAGNOSIS — I10 Essential (primary) hypertension: Secondary | ICD-10-CM

## 2015-04-28 DIAGNOSIS — G5600 Carpal tunnel syndrome, unspecified upper limb: Secondary | ICD-10-CM | POA: Diagnosis not present

## 2015-04-28 DIAGNOSIS — D72829 Elevated white blood cell count, unspecified: Secondary | ICD-10-CM | POA: Diagnosis not present

## 2015-04-28 DIAGNOSIS — M5126 Other intervertebral disc displacement, lumbar region: Secondary | ICD-10-CM | POA: Diagnosis not present

## 2015-04-28 DIAGNOSIS — J4520 Mild intermittent asthma, uncomplicated: Secondary | ICD-10-CM

## 2015-04-28 DIAGNOSIS — Z794 Long term (current) use of insulin: Secondary | ICD-10-CM | POA: Diagnosis not present

## 2015-04-28 DIAGNOSIS — E118 Type 2 diabetes mellitus with unspecified complications: Secondary | ICD-10-CM

## 2015-04-28 DIAGNOSIS — J302 Other seasonal allergic rhinitis: Secondary | ICD-10-CM

## 2015-04-28 LAB — CBC WITH DIFFERENTIAL/PLATELET
Basophils Absolute: 0 10*3/uL (ref 0.0–0.1)
Basophils Relative: 0.5 % (ref 0.0–3.0)
Eosinophils Absolute: 0.2 10*3/uL (ref 0.0–0.7)
Eosinophils Relative: 2.3 % (ref 0.0–5.0)
HCT: 31.5 % — ABNORMAL LOW (ref 36.0–46.0)
Hemoglobin: 9.8 g/dL — ABNORMAL LOW (ref 12.0–15.0)
Lymphocytes Relative: 29.1 % (ref 12.0–46.0)
Lymphs Abs: 2 10*3/uL (ref 0.7–4.0)
MCHC: 31 g/dL (ref 30.0–36.0)
MCV: 71.9 fl — ABNORMAL LOW (ref 78.0–100.0)
Monocytes Absolute: 0.4 10*3/uL (ref 0.1–1.0)
Monocytes Relative: 6.3 % (ref 3.0–12.0)
Neutro Abs: 4.2 10*3/uL (ref 1.4–7.7)
Neutrophils Relative %: 61.8 % (ref 43.0–77.0)
Platelets: 354 10*3/uL (ref 150.0–400.0)
RBC: 4.38 Mil/uL (ref 3.87–5.11)
RDW: 19.3 % — ABNORMAL HIGH (ref 11.5–15.5)
WBC: 6.8 10*3/uL (ref 4.0–10.5)

## 2015-04-28 LAB — COMPREHENSIVE METABOLIC PANEL
ALT: 25 U/L (ref 0–35)
AST: 27 U/L (ref 0–37)
Albumin: 3.7 g/dL (ref 3.5–5.2)
Alkaline Phosphatase: 97 U/L (ref 39–117)
BUN: 20 mg/dL (ref 6–23)
CO2: 30 mEq/L (ref 19–32)
Calcium: 9.9 mg/dL (ref 8.4–10.5)
Chloride: 105 mEq/L (ref 96–112)
Creatinine, Ser: 1.02 mg/dL (ref 0.40–1.20)
GFR: 71.93 mL/min (ref >60.00)
Glucose, Bld: 52 mg/dL — ABNORMAL LOW (ref 70–99)
Potassium: 3.6 mEq/L (ref 3.5–5.1)
Sodium: 143 mEq/L (ref 135–145)
Total Bilirubin: 0.3 mg/dL (ref 0.2–1.2)
Total Protein: 7.8 g/dL (ref 6.0–8.3)

## 2015-04-28 LAB — MICROALBUMIN / CREATININE URINE RATIO
Creatinine,U: 137.1 mg/dL
Microalb Creat Ratio: 0.5 mg/g (ref 0.0–30.0)
Microalb, Ur: <0.7 mg/dL (ref 0.0–1.9)

## 2015-04-28 LAB — LIPID PANEL
Cholesterol: 145 mg/dL (ref 0–200)
HDL: 54.7 mg/dL (ref >39.00)
LDL Cholesterol: 79 mg/dL (ref 0–99)
NonHDL: 90.3
Total CHOL/HDL Ratio: 3
Triglycerides: 57 mg/dL (ref 0.0–149.0)
VLDL: 11.4 mg/dL (ref 0.0–40.0)

## 2015-04-28 LAB — POCT URINALYSIS DIPSTICK
Bilirubin, UA: NEGATIVE
Blood, UA: NEGATIVE
Glucose, UA: NEGATIVE
Ketones, UA: NEGATIVE
Nitrite, UA: NEGATIVE
Protein, UA: NEGATIVE
Spec Grav, UA: >=1.03
Urobilinogen, UA: 0.2
pH, UA: 5.5

## 2015-04-28 LAB — HEMOGLOBIN A1C: Hgb A1c MFr Bld: 6 % (ref 4.6–6.5)

## 2015-04-28 LAB — TSH: TSH: 1.74 u[IU]/mL (ref 0.35–4.50)

## 2015-04-28 MED ORDER — FLUTICASONE PROPIONATE 50 MCG/ACT NA SUSP: 2.0000 | Freq: Every day | NASAL | Status: DC

## 2015-04-28 MED ORDER — ALBUTEROL SULFATE 108 (90 BASE) MCG/ACT IN AEPB: 2.0000 | INHALATION_SPRAY | Freq: Four times a day (QID) | RESPIRATORY_TRACT | Status: DC | PRN

## 2015-04-28 MED ORDER — LOSARTAN POTASSIUM 50 MG PO TABS: 50.0000 mg | ORAL_TABLET | Freq: Every day | ORAL | Status: DC

## 2015-04-28 MED ORDER — HYDROCHLOROTHIAZIDE 25 MG PO TABS: 25.0000 mg | ORAL_TABLET | Freq: Every day | ORAL | Status: DC

## 2015-04-28 NOTE — Progress Notes (Signed)
Subjective:     Carla Jensen is a 54 y.o. female and is here for a comprehensive physical exam. The patient reports problems - allergies are acting up.  she needs refills on her flonase and inhaler.  . Glucose is running under 100 most days.  She is not checking her bp.  No cp, sob or palpitations.   Social History   Social History  . Marital Status: Married    Spouse Name: N/A  . Number of Children: N/A  . Years of Education: N/A   Occupational History  .  [place]    retired Pharmacist, hospital    Social History Main Topics  . Smoking status: Never Smoker   . Smokeless tobacco: Never Used  . Alcohol Use: No  . Drug Use: No  . Sexual Activity:    Partners: Male   Other Topics Concern  . Not on file   Social History Narrative   Exercise--no--- because of herniated disc   Health Maintenance  Topic Date Due  . Hepatitis C Screening  1960/12/29  . OPHTHALMOLOGY EXAM  03/29/2014  . MAMMOGRAM  01/12/2015  . URINE MICROALBUMIN  01/23/2015  . HIV Screening  10/08/2015 (Originally 09/05/1975)  . INFLUENZA VACCINE  04/27/2016 (Originally 02/10/2015)  . HEMOGLOBIN A1C  05/16/2015  . FOOT EXAM  11/13/2015  . PAP SMEAR  12/12/2015  . TETANUS/TDAP  12/11/2017  . PNEUMOCOCCAL POLYSACCHARIDE VACCINE (2) 10/20/2018  . COLONOSCOPY  02/09/2023    The following portions of the patient's history were reviewed and updated as appropriate:  She  has a past medical history of Asthma; Arthritis; Depression; GERD (gastroesophageal reflux disease); Allergy; Hypertension; Chickenpox; Subarachnoid hemorrhage ([place]) (3/11); Diabetes mellitus without complication ([place]); and Stroke ([place]). She  does not have any pertinent problems on file. She  has past surgical history that includes Knee arthroscopy (Left, 5/06); Tendon repair (Right, 3/07); Achilles tendon repair (Left, 1/08); Achilles tendon repair (Right, 7/09); and Elbow surgery. Her family history includes Arthritis in her mother; Diabetes  in her mother; High blood pressure in her father, mother, and sister. She  reports that she has never smoked. She has never used smokeless tobacco. She reports that she does not drink alcohol or use illicit drugs. She has a current medication list which includes the following prescription(s): albuterol, amlodipine, aspirin, one touch ultra system kit, cinnamon, ferrous sulfate, fluticasone, gabapentin, glucose blood, hydroxyzine, insulin regular, lancets thin, multivitamin, naproxen sodium, pantoprazole, natural vegetable laxative, tizanidine, vitamin c, albuterol sulfate, hydrochlorothiazide, and losartan. Current Outpatient Prescriptions on File Prior to Visit  Medication Sig Dispense Refill  . albuterol (PROVENTIL) (2.5 MG/3ML) 0.083% nebulizer solution Take 3 mLs (2.5 mg total) by nebulization every 6 (six) hours as needed for wheezing. 75 mL 2  . amLODipine (NORVASC) 5 MG tablet Take 1 tablet (5 mg total) by mouth daily. 90 tablet 3  . aspirin 81 MG tablet Take 81 mg by mouth daily.    . Blood Glucose Monitoring Suppl (ONE TOUCH ULTRA SYSTEM KIT) W/DEVICE KIT 1 kit by Does not apply route once. Dx. 250.00 1 each 0  . CINNAMON PO Take 1,000 mg by mouth daily.    . ferrous sulfate 325 (65 FE) MG tablet Take 650 mg by mouth daily with breakfast.     . gabapentin (NEURONTIN) 300 MG capsule TAKE 1 CAPSULE (300 MG TOTAL) BY MOUTH 2 (TWO) TIMES DAILY. 60 capsule 5  . glucose blood test strip Use as instructed. Pt tests sugars once daily. Dx.  250.00 100 each 12  . insulin regular (HUMULIN R) 100 units/mL injection 3 times a day (just before each meal), 25-25-20 units, and syringes 3/day (Patient taking differently: 3 times a day (just before each meal), 25-25-15 units, and syringes 3/day) 30 mL 11  . Lancets Thin MISC Use as directed. Pt tests sugars once daily. Dx. 250.00 100 each 12  . Multiple Vitamin (MULTIVITAMIN) tablet Take 1 tablet by mouth daily.    . naproxen sodium (ANAPROX) 220 MG tablet  Take 220 mg by mouth daily as needed. For pain    . pantoprazole (PROTONIX) 40 MG tablet TAKE 1 TABLET (40 MG TOTAL) BY MOUTH DAILY. 30 tablet 5  . Senna (NATURAL VEGETABLE LAXATIVE) 65-325 MG TABS Take 1 tablet by mouth 2 (two) times daily.     Marland Kitchen tiZANidine (ZANAFLEX) 4 MG tablet TAKE 1 TABLET BY MOUTH EVERY 6 (SIX) HOURS AS NEEDED FOR MUSCLE SPASMS. 30 tablet 1  . vitamin C (ASCORBIC ACID) 500 MG tablet Take 1,000 mg by mouth daily.      No current facility-administered medications on file prior to visit.   She has No Known Allergies..  Review of Systems Review of Systems  Constitutional: Negative for activity change, appetite change and fatigue.  HENT: Negative for hearing loss, congestion, tinnitus and ear discharge.  dentist q77m Eyes: Negative for visual disturbance (see optho q1y -- vision corrected to 20/20 with glasses).  Respiratory: Negative for cough, chest tightness and shortness of breath.   Cardiovascular: Negative for chest pain, palpitations and leg swelling.  Gastrointestinal: Negative for abdominal pain, diarrhea, constipation and abdominal distention.  Genitourinary: Negative for urgency, frequency, decreased urine volume and difficulty urinating.  Musculoskeletal: Negative for back pain, arthralgias and gait problem.  Skin: Negative for color change, pallor and rash.  Neurological: Negative for dizziness, light-headedness, numbness and headaches.  Hematological: Negative for adenopathy. Does not bruise/bleed easily.  Psychiatric/Behavioral: Negative for suicidal ideas, confusion, sleep disturbance, self-injury, dysphoric mood, decreased concentration and agitation.       Objective:    BP 127/82 mmHg  Pulse 85  Temp(Src) 98.3 F (36.8 C) (Oral)  Ht $R'5\' 2"'xU$  (1.575 m)  Wt 266 lb 9.6 oz (120.929 kg)  BMI 48.75 kg/m2  SpO2 98%  LMP 08/05/2010 General appearance: alert, cooperative, appears stated age and no distress Head: Normocephalic, without obvious  abnormality, atraumatic Eyes: conjunctivae/corneas clear. PERRL, EOM's intact. Fundi benign. Ears: normal TM's and external ear canals both ears Nose: Nares normal. Septum midline. Mucosa normal. No drainage or sinus tenderness. Throat: lips, mucosa, and tongue normal; teeth and gums normal Neck: no adenopathy, no carotid bruit, no JVD, supple, symmetrical, trachea midline and thyroid not enlarged, symmetric, no tenderness/mass/nodules Back: symmetric, no curvature. ROM normal. No CVA tenderness. Lungs: clear to auscultation bilaterally Breasts: normal appearance, no masses or tenderness Heart: regular rate and rhythm, S1, S2 normal, no murmur, click, rub or gallop Abdomen: soft, non-tender; bowel sounds normal; no masses,  no organomegaly Pelvic: deferred Extremities: extremities normal, atraumatic, no cyanosis or edema Pulses: 2+ and symmetric Skin: Skin color, texture, turgor normal. No rashes or lesions Lymph nodes: Cervical, supraclavicular, and axillary nodes normal. Neurologic: Alert and oriented X 3, normal strength and tone. Normal symmetric reflexes. Normal coordination and gait    Assessment:    Healthy female exam.      Plan:    ghm utd Check labs See After Visit Summary for Counseling Recommendations    1. Encounter for screening mammogram for breast cancer  -  MM Digital Screening; Future - Microalbumin / creatinine urine ratio - POCT urinalysis dipstick - TSH  2. Essential hypertension Stable, cont meds - losartan (COZAAR) 50 MG tablet; Take 1 tablet (50 mg total) by mouth daily.  Dispense: 30 tablet; Refill: 5 - Comp Met (CMET) - CBC with Differential/Platelet - Lipid panel - Microalbumin / creatinine urine ratio - POCT urinalysis dipstick - TSH  3. Seasonal allergies  - fluticasone (FLONASE) 50 MCG/ACT nasal spray; Place 2 sprays into both nostrils daily.  Dispense: 16 g; Refill: 5  4. Asthma, mild intermittent, uncomplicated stable  - Albuterol  Sulfate (PROAIR RESPICLICK) 373 (90 BASE) MCG/ACT AEPB; Inhale 2 puffs into the lungs 4 (four) times daily as needed.  Dispense: 1 each; Refill: 3  5. Type 2 diabetes mellitus with complication, with long-term current use of insulin (HCC) Per endo, check labs - Comp Met (CMET) - Hemoglobin A1c - Lipid panel - Microalbumin / creatinine urine ratio - POCT urinalysis dipstick - TSH  6. Preventative health care See above  - HIV antibody - Hepatitis C antibody

## 2015-04-28 NOTE — Progress Notes (Signed)
Pre visit review using our clinic review tool, if applicable. No additional management support is needed unless otherwise documented below in the visit note. 

## 2015-04-28 NOTE — Patient Instructions (Addendum)
Please schedule your Mammogram at the [facility] Address: [address], [place], [place] [address]  Hours:   Monday 7AM-6:30PM  Tuesday 7AM-5PM  Wednesday 7AM-5PM  Thursday 7AM-5PM  Friday 7AM-5PM  Saturday Closed  Sunday Closed  Phone: [phone]   Preventive Care for Adults, Female A healthy lifestyle and preventive care can promote health and wellness. Preventive health guidelines for women include the following key practices.  A routine yearly physical is a good way to check with your health care provider about your health and preventive screening. It is a chance to share any concerns and updates on your health and to receive a thorough exam.  Visit your dentist for a routine exam and preventive care every 6 months. Brush your teeth twice a day and floss once a day. Good oral hygiene prevents tooth decay and gum disease.  The frequency of eye exams is based on your age, health, family medical history, use of contact lenses, and other factors. Follow your health care provider's recommendations for frequency of eye exams.  Eat a healthy diet. Foods like vegetables, fruits, whole grains, low-fat dairy products, and lean protein foods contain the nutrients you need without too many calories. Decrease your intake of foods high in solid fats, added sugars, and salt. Eat the right amount of calories for you.Get information about a proper diet from your health care provider, if necessary.  Regular physical exercise is one of the most important things you can do for your health. Most adults should get at least 150 minutes of moderate-intensity exercise (any activity that increases your heart rate and causes you to sweat) each week. In addition, most adults need muscle-strengthening exercises on 2 or more days a week.  Maintain a healthy weight. The body mass index (BMI) is a screening tool to identify possible weight problems. It provides an estimate of body fat based  on height and weight. Your health care provider can find your BMI and can help you achieve or maintain a healthy weight.For adults 20 years and older:  A BMI below 18.5 is considered underweight.  A BMI of 18.5 to 24.9 is normal.  A BMI of 25 to 29.9 is considered overweight.  A BMI of 30 and above is considered obese.  Maintain normal blood lipids and cholesterol levels by exercising and minimizing your intake of saturated fat. Eat a balanced diet with plenty of fruit and vegetables. Blood tests for lipids and cholesterol should begin at age 62 and be repeated every 5 years. If your lipid or cholesterol levels are high, you are over 50, or you are at high risk for heart disease, you may need your cholesterol levels checked more frequently.Ongoing high lipid and cholesterol levels should be treated with medicines if diet and exercise are not working.  If you smoke, find out from your health care provider how to quit. If you do not use tobacco, do not start.  Lung cancer screening is recommended for adults aged 61-80 years who are at high risk for developing lung cancer because of a history of smoking. A yearly low-dose CT scan of the lungs is recommended for people who have at least a 30-pack-year history of smoking and are a current smoker or have quit within the past 15 years. A pack year of smoking is smoking an average of 1 pack of cigarettes a day for 1 year (for example: 1 pack a day for 30 years or 2 packs a day for 15  years). Yearly screening should continue until the smoker has stopped smoking for at least 15 years. Yearly screening should be stopped for people who develop a health problem that would prevent them from having lung cancer treatment.  If you are pregnant, do not drink alcohol. If you are breastfeeding, be very cautious about drinking alcohol. If you are not pregnant and choose to drink alcohol, do not have more than 1 drink per day. One drink is considered to be 12 ounces  (355 mL) of beer, 5 ounces (148 mL) of wine, or 1.5 ounces (44 mL) of liquor.  Avoid use of street drugs. Do not share needles with anyone. Ask for help if you need support or instructions about stopping the use of drugs.  High blood pressure causes heart disease and increases the risk of stroke. Your blood pressure should be checked at least every 1 to 2 years. Ongoing high blood pressure should be treated with medicines if weight loss and exercise do not work.  If you are 48-25 years old, ask your health care provider if you should take aspirin to prevent strokes.  Diabetes screening is done by taking a blood sample to check your blood glucose level after you have not eaten for a certain period of time (fasting). If you are not overweight and you do not have risk factors for diabetes, you should be screened once every 3 years starting at age 20. If you are overweight or obese and you are 45-63 years of age, you should be screened for diabetes every year as part of your cardiovascular risk assessment.  Breast cancer screening is essential preventive care for women. You should practice "breast self-awareness." This means understanding the normal appearance and feel of your breasts and may include breast self-examination. Any changes detected, no matter how small, should be reported to a health care provider. Women in their 49s and 30s should have a clinical breast exam (CBE) by a health care provider as part of a regular health exam every 1 to 3 years. After age 22, women should have a CBE every year. Starting at age 85, women should consider having a mammogram (breast X-ray test) every year. Women who have a family history of breast cancer should talk to their health care provider about genetic screening. Women at a high risk of breast cancer should talk to their health care providers about having an MRI and a mammogram every year.  Breast cancer gene (BRCA)-related cancer risk assessment is recommended  for women who have family members with BRCA-related cancers. BRCA-related cancers include breast, ovarian, tubal, and peritoneal cancers. Having family members with these cancers may be associated with an increased risk for harmful changes (mutations) in the breast cancer genes BRCA1 and BRCA2. Results of the assessment will determine the need for genetic counseling and BRCA1 and BRCA2 testing.  Your health care provider may recommend that you be screened regularly for cancer of the pelvic organs (ovaries, uterus, and vagina). This screening involves a pelvic examination, including checking for microscopic changes to the surface of your cervix (Pap test). You may be encouraged to have this screening done every 3 years, beginning at age 59.  For women ages 59-65, health care providers may recommend pelvic exams and Pap testing every 3 years, or they may recommend the Pap and pelvic exam, combined with testing for human papilloma virus (HPV), every 5 years. Some types of HPV increase your risk of cervical cancer. Testing for HPV may also be done on  women of any age with unclear Pap test results.  Other health care providers may not recommend any screening for nonpregnant women who are considered low risk for pelvic cancer and who do not have symptoms. Ask your health care provider if a screening pelvic exam is right for you.  If you have had past treatment for cervical cancer or a condition that could lead to cancer, you need Pap tests and screening for cancer for at least 20 years after your treatment. If Pap tests have been discontinued, your risk factors (such as having a new sexual partner) need to be reassessed to determine if screening should resume. Some women have medical problems that increase the chance of getting cervical cancer. In these cases, your health care provider may recommend more frequent screening and Pap tests.  Colorectal cancer can be detected and often prevented. Most routine  colorectal cancer screening begins at the age of 36 years and continues through age 59 years. However, your health care provider may recommend screening at an earlier age if you have risk factors for colon cancer. On a yearly basis, your health care provider may provide home test kits to check for hidden blood in the stool. Use of a small camera at the end of a tube, to directly examine the colon (sigmoidoscopy or colonoscopy), can detect the earliest forms of colorectal cancer. Talk to your health care provider about this at age 57, when routine screening begins. Direct exam of the colon should be repeated every 5-10 years through age 70 years, unless early forms of precancerous polyps or small growths are found.  People who are at an increased risk for hepatitis B should be screened for this virus. You are considered at high risk for hepatitis B if:  You were born in a country where hepatitis B occurs often. Talk with your health care provider about which countries are considered high risk.  Your parents were born in a high-risk country and you have not received a shot to protect against hepatitis B (hepatitis B vaccine).  You have HIV or AIDS.  You use needles to inject street drugs.  You live with, or have sex with, someone who has hepatitis B.  You get hemodialysis treatment.  You take certain medicines for conditions like cancer, organ transplantation, and autoimmune conditions.  Hepatitis C blood testing is recommended for all people born from [birth year] through 1965 and any individual with known risks for hepatitis C.  Practice safe sex. Use condoms and avoid high-risk sexual practices to reduce the spread of sexually transmitted infections (STIs). STIs include gonorrhea, chlamydia, syphilis, trichomonas, herpes, HPV, and human immunodeficiency virus (HIV). Herpes, HIV, and HPV are viral illnesses that have no cure. They can result in disability, cancer, and death.  You should be screened  for sexually transmitted illnesses (STIs) including gonorrhea and chlamydia if:  You are sexually active and are younger than 24 years.  You are older than 24 years and your health care provider tells you that you are at risk for this type of infection.  Your sexual activity has changed since you were last screened and you are at an increased risk for chlamydia or gonorrhea. Ask your health care provider if you are at risk.  If you are at risk of being infected with HIV, it is recommended that you take a prescription medicine daily to prevent HIV infection. This is called preexposure prophylaxis (PrEP). You are considered at risk if:  You are sexually active and do  not regularly use condoms or know the HIV status of your partner(s).  You take drugs by injection.  You are sexually active with a partner who has HIV.  Talk with your health care provider about whether you are at high risk of being infected with HIV. If you choose to begin PrEP, you should first be tested for HIV. You should then be tested every 3 months for as long as you are taking PrEP.  Osteoporosis is a disease in which the bones lose minerals and strength with aging. This can result in serious bone fractures or breaks. The risk of osteoporosis can be identified using a bone density scan. Women ages 27 years and over and women at risk for fractures or osteoporosis should discuss screening with their health care providers. Ask your health care provider whether you should take a calcium supplement or vitamin D to reduce the rate of osteoporosis.  Menopause can be associated with physical symptoms and risks. Hormone replacement therapy is available to decrease symptoms and risks. You should talk to your health care provider about whether hormone replacement therapy is right for you.  Use sunscreen. Apply sunscreen liberally and repeatedly throughout the day. You should seek shade when your shadow is shorter than you. Protect  yourself by wearing long sleeves, pants, a wide-brimmed hat, and sunglasses year round, whenever you are outdoors.  Once a month, do a whole body skin exam, using a mirror to look at the skin on your back. Tell your health care provider of new moles, moles that have irregular borders, moles that are larger than a pencil eraser, or moles that have changed in shape or color.  Stay current with required vaccines (immunizations).  Influenza vaccine. All adults should be immunized every year.  Tetanus, diphtheria, and acellular pertussis (Td, Tdap) vaccine. Pregnant women should receive 1 dose of Tdap vaccine during each pregnancy. The dose should be obtained regardless of the length of time since the last dose. Immunization is preferred during the 27th-36th week of gestation. An adult who has not previously received Tdap or who does not know her vaccine status should receive 1 dose of Tdap. This initial dose should be followed by tetanus and diphtheria toxoids (Td) booster doses every 10 years. Adults with an unknown or incomplete history of completing a 3-dose immunization series with Td-containing vaccines should begin or complete a primary immunization series including a Tdap dose. Adults should receive a Td booster every 10 years.  Varicella vaccine. An adult without evidence of immunity to varicella should receive 2 doses or a second dose if she has previously received 1 dose. Pregnant females who do not have evidence of immunity should receive the first dose after pregnancy. This first dose should be obtained before leaving the health care facility. The second dose should be obtained 4-8 weeks after the first dose.  Human papillomavirus (HPV) vaccine. Females aged 13-26 years who have not received the vaccine previously should obtain the 3-dose series. The vaccine is not recommended for use in pregnant females. However, pregnancy testing is not needed before receiving a dose. If a female is found to be  pregnant after receiving a dose, no treatment is needed. In that case, the remaining doses should be delayed until after the pregnancy. Immunization is recommended for any person with an immunocompromised condition through the age of 47 years if she did not get any or all doses earlier. During the 3-dose series, the second dose should be obtained 4-8 weeks after the  first dose. The third dose should be obtained 24 weeks after the first dose and 16 weeks after the second dose.  Zoster vaccine. One dose is recommended for adults aged 51 years or older unless certain conditions are present.  Measles, mumps, and rubella (MMR) vaccine. Adults born before [birth year] generally are considered immune to measles and mumps. Adults born in [birth year] or later should have 1 or more doses of MMR vaccine unless there is a contraindication to the vaccine or there is laboratory evidence of immunity to each of the three diseases. A routine second dose of MMR vaccine should be obtained at least 28 days after the first dose for students attending postsecondary schools, health care workers, or international travelers. People who received inactivated measles vaccine or an unknown type of measles vaccine during 1963-1967 should receive 2 doses of MMR vaccine. People who received inactivated mumps vaccine or an unknown type of mumps vaccine before 1979 and are at high risk for mumps infection should consider immunization with 2 doses of MMR vaccine. For females of childbearing age, rubella immunity should be determined. If there is no evidence of immunity, females who are not pregnant should be vaccinated. If there is no evidence of immunity, females who are pregnant should delay immunization until after pregnancy. Unvaccinated health care workers born before [birth year] who lack laboratory evidence of measles, mumps, or rubella immunity or laboratory confirmation of disease should consider measles and mumps immunization with 2 doses of MMR vaccine or  rubella immunization with 1 dose of MMR vaccine.  Pneumococcal 13-valent conjugate (PCV13) vaccine. When indicated, a person who is uncertain of his immunization history and has no record of immunization should receive the PCV13 vaccine. All adults 33 years of age and older should receive this vaccine. An adult aged 71 years or older who has certain medical conditions and has not been previously immunized should receive 1 dose of PCV13 vaccine. This PCV13 should be followed with a dose of pneumococcal polysaccharide (PPSV23) vaccine. Adults who are at high risk for pneumococcal disease should obtain the PPSV23 vaccine at least 8 weeks after the dose of PCV13 vaccine. Adults older than 56 years of age who have normal immune system function should obtain the PPSV23 vaccine dose at least 1 year after the dose of PCV13 vaccine.  Pneumococcal polysaccharide (PPSV23) vaccine. When PCV13 is also indicated, PCV13 should be obtained first. All adults aged 28 years and older should be immunized. An adult younger than age 35 years who has certain medical conditions should be immunized. Any person who resides in a nursing home or long-term care facility should be immunized. An adult smoker should be immunized. People with an immunocompromised condition and certain other conditions should receive both PCV13 and PPSV23 vaccines. People with human immunodeficiency virus (HIV) infection should be immunized as soon as possible after diagnosis. Immunization during chemotherapy or radiation therapy should be avoided. Routine use of PPSV23 vaccine is not recommended for American Indians, [place] Natives, or people younger than 65 years unless there are medical conditions that require PPSV23 vaccine. When indicated, people who have unknown immunization and have no record of immunization should receive PPSV23 vaccine. One-time revaccination 5 years after the first dose of PPSV23 is recommended for people aged 19-64 years who have  chronic kidney failure, nephrotic syndrome, asplenia, or immunocompromised conditions. People who received 1-2 doses of PPSV23 before age 37 years should receive another dose of PPSV23 vaccine at age 6 years or later if at least 5 years  have passed since the previous dose. Doses of PPSV23 are not needed for people immunized with PPSV23 at or after age 47 years.  Meningococcal vaccine. Adults with asplenia or persistent complement component deficiencies should receive 2 doses of quadrivalent meningococcal conjugate (MenACWY-D) vaccine. The doses should be obtained at least 2 months apart. Microbiologists working with certain meningococcal bacteria, [employer] recruits, people at risk during an outbreak, and people who travel to or live in countries with a high rate of meningitis should be immunized. A first-year college student up through age 57 years who is living in a residence [place] should receive a dose if she did not receive a dose on or after her 16th birthday. Adults who have certain high-risk conditions should receive one or more doses of vaccine.  Hepatitis A vaccine. Adults who wish to be protected from this disease, have certain high-risk conditions, work with hepatitis A-infected animals, work in hepatitis A research labs, or travel to or work in countries with a high rate of hepatitis A should be immunized. Adults who were previously unvaccinated and who anticipate close contact with an international adoptee during the first 60 days after arrival in the [place] from a country with a high rate of hepatitis A should be immunized.  Hepatitis B vaccine. Adults who wish to be protected from this disease, have certain high-risk conditions, may be exposed to blood or other infectious body fluids, are household contacts or sex partners of hepatitis B positive people, are clients or workers in certain care facilities, or travel to or work in countries with a high rate of hepatitis B should be  immunized.  Haemophilus influenzae type b (Hib) vaccine. A previously unvaccinated person with asplenia or sickle cell disease or having a scheduled splenectomy should receive 1 dose of Hib vaccine. Regardless of previous immunization, a recipient of a hematopoietic stem cell transplant should receive a 3-dose series 6-12 months after her successful transplant. Hib vaccine is not recommended for adults with HIV infection. Preventive Services / Frequency Ages 74 to 52 years  Blood pressure check.** / Every 3-5 years.  Lipid and cholesterol check.** / Every 5 years beginning at age 67.  Clinical breast exam.** / Every 3 years for women in their 68s and 14s.  BRCA-related cancer risk assessment.** / For women who have family members with a BRCA-related cancer (breast, ovarian, tubal, or peritoneal cancers).  Pap test.** / Every 2 years from ages 4 through 59. Every 3 years starting at age 48 through age 20 or 67 with a history of 3 consecutive normal Pap tests.  HPV screening.** / Every 3 years from ages 44 through ages 2 to 45 with a history of 3 consecutive normal Pap tests.  Hepatitis C blood test.** / For any individual with known risks for hepatitis C.  Skin self-exam. / Monthly.  Influenza vaccine. / Every year.  Tetanus, diphtheria, and acellular pertussis (Tdap, Td) vaccine.** / Consult your health care provider. Pregnant women should receive 1 dose of Tdap vaccine during each pregnancy. 1 dose of Td every 10 years.  Varicella vaccine.** / Consult your health care provider. Pregnant females who do not have evidence of immunity should receive the first dose after pregnancy.  HPV vaccine. / 3 doses over 6 months, if 92 and younger. The vaccine is not recommended for use in pregnant females. However, pregnancy testing is not needed before receiving a dose.  Measles, mumps, rubella (MMR) vaccine.** / You need at least 1 dose of MMR if  you were born in [birth year] or later. You may also need  a 2nd dose. For females of childbearing age, rubella immunity should be determined. If there is no evidence of immunity, females who are not pregnant should be vaccinated. If there is no evidence of immunity, females who are pregnant should delay immunization until after pregnancy.  Pneumococcal 13-valent conjugate (PCV13) vaccine.** / Consult your health care provider.  Pneumococcal polysaccharide (PPSV23) vaccine.** / 1 to 2 doses if you smoke cigarettes or if you have certain conditions.  Meningococcal vaccine.** / 1 dose if you are age 75 to 70 years and a Market researcher living in a residence [place], or have one of several medical conditions, you need to get vaccinated against meningococcal disease. You may also need additional booster doses.  Hepatitis A vaccine.** / Consult your health care provider.  Hepatitis B vaccine.** / Consult your health care provider.  Haemophilus influenzae type b (Hib) vaccine.** / Consult your health care provider. Ages 54 to 42 years  Blood pressure check.** / Every year.  Lipid and cholesterol check.** / Every 5 years beginning at age 70 years.  Lung cancer screening. / Every year if you are aged 71-80 years and have a 30-pack-year history of smoking and currently smoke or have quit within the past 15 years. Yearly screening is stopped once you have quit smoking for at least 15 years or develop a health problem that would prevent you from having lung cancer treatment.  Clinical breast exam.** / Every year after age 5 years.  BRCA-related cancer risk assessment.** / For women who have family members with a BRCA-related cancer (breast, ovarian, tubal, or peritoneal cancers).  Mammogram.** / Every year beginning at age 82 years and continuing for as long as you are in good health. Consult with your health care provider.  Pap test.** / Every 3 years starting at age 47 years through age 71 or 45 years with a history of 3 consecutive normal Pap  tests.  HPV screening.** / Every 3 years from ages 66 years through ages 28 to 60 years with a history of 3 consecutive normal Pap tests.  Fecal occult blood test (FOBT) of stool. / Every year beginning at age 38 years and continuing until age 53 years. You may not need to do this test if you get a colonoscopy every 10 years.  Flexible sigmoidoscopy or colonoscopy.** / Every 5 years for a flexible sigmoidoscopy or every 10 years for a colonoscopy beginning at age 91 years and continuing until age 48 years.  Hepatitis C blood test.** / For all people born from [birth year] through 1967 and any individual with known risks for hepatitis C.  Skin self-exam. / Monthly.  Influenza vaccine. / Every year.  Tetanus, diphtheria, and acellular pertussis (Tdap/Td) vaccine.** / Consult your health care provider. Pregnant women should receive 1 dose of Tdap vaccine during each pregnancy. 1 dose of Td every 10 years.  Varicella vaccine.** / Consult your health care provider. Pregnant females who do not have evidence of immunity should receive the first dose after pregnancy.  Zoster vaccine.** / 1 dose for adults aged 54 years or older.  Measles, mumps, rubella (MMR) vaccine.** / You need at least 1 dose of MMR if you were born in 1957 or later. You may also need a second dose. For females of childbearing age, rubella immunity should be determined. If there is no evidence of immunity, females who are not pregnant should be vaccinated. If there is no  evidence of immunity, females who are pregnant should delay immunization until after pregnancy.  Pneumococcal 13-valent conjugate (PCV13) vaccine.** / Consult your health care provider.  Pneumococcal polysaccharide (PPSV23) vaccine.** / 1 to 2 doses if you smoke cigarettes or if you have certain conditions.  Meningococcal vaccine.** / Consult your health care provider.  Hepatitis A vaccine.** / Consult your health care provider.  Hepatitis B vaccine.** / Consult  your health care provider.  Haemophilus influenzae type b (Hib) vaccine.** / Consult your health care provider. Ages 77 years and over  Blood pressure check.** / Every year.  Lipid and cholesterol check.** / Every 5 years beginning at age 40 years.  Lung cancer screening. / Every year if you are aged 12-80 years and have a 30-pack-year history of smoking and currently smoke or have quit within the past 15 years. Yearly screening is stopped once you have quit smoking for at least 15 years or develop a health problem that would prevent you from having lung cancer treatment.  Clinical breast exam.** / Every year after age 71 years.  BRCA-related cancer risk assessment.** / For women who have family members with a BRCA-related cancer (breast, ovarian, tubal, or peritoneal cancers).  Mammogram.** / Every year beginning at age 67 years and continuing for as long as you are in good health. Consult with your health care provider.  Pap test.** / Every 3 years starting at age 10 years through age 45 or 57 years with 3 consecutive normal Pap tests. Testing can be stopped between 65 and 70 years with 3 consecutive normal Pap tests and no abnormal Pap or HPV tests in the past 10 years.  HPV screening.** / Every 3 years from ages 48 years through ages 72 or 33 years with a history of 3 consecutive normal Pap tests. Testing can be stopped between 65 and 70 years with 3 consecutive normal Pap tests and no abnormal Pap or HPV tests in the past 10 years.  Fecal occult blood test (FOBT) of stool. / Every year beginning at age 1 years and continuing until age 45 years. You may not need to do this test if you get a colonoscopy every 10 years.  Flexible sigmoidoscopy or colonoscopy.** / Every 5 years for a flexible sigmoidoscopy or every 10 years for a colonoscopy beginning at age 39 years and continuing until age 3 years.  Hepatitis C blood test.** / For all people born from [birth year] through 1965 and any  individual with known risks for hepatitis C.  Osteoporosis screening.** / A one-time screening for women ages 87 years and over and women at risk for fractures or osteoporosis.  Skin self-exam. / Monthly.  Influenza vaccine. / Every year.  Tetanus, diphtheria, and acellular pertussis (Tdap/Td) vaccine.** / 1 dose of Td every 10 years.  Varicella vaccine.** / Consult your health care provider.  Zoster vaccine.** / 1 dose for adults aged 54 years or older.  Pneumococcal 13-valent conjugate (PCV13) vaccine.** / Consult your health care provider.  Pneumococcal polysaccharide (PPSV23) vaccine.** / 1 dose for all adults aged 52 years and older.  Meningococcal vaccine.** / Consult your health care provider.  Hepatitis A vaccine.** / Consult your health care provider.  Hepatitis B vaccine.** / Consult your health care provider.  Haemophilus influenzae type b (Hib) vaccine.** / Consult your health care provider. ** Family history and personal history of risk and conditions may change your health care provider's recommendations.   This information is not intended to replace advice given to  you by your health care provider. Make sure you discuss any questions you have with your health care provider.   Document Released: 08/24/2001 Document Revised: 07/19/2014 Document Reviewed: 11/23/2010 Elsevier Interactive Patient Education 2016 [place] for Adults, Female A healthy lifestyle and preventive care can promote health and wellness. Preventive health guidelines for women include the following key practices.  A routine yearly physical is a good way to check with your health care provider about your health and preventive screening. It is a chance to share any concerns and updates on your health and to receive a thorough exam.  Visit your dentist for a routine exam and preventive care every 6 months. Brush your teeth twice a day and floss once a day. Good oral hygiene  prevents tooth decay and gum disease.  The frequency of eye exams is based on your age, health, family medical history, use of contact lenses, and other factors. Follow your health care provider's recommendations for frequency of eye exams.  Eat a healthy diet. Foods like vegetables, fruits, whole grains, low-fat dairy products, and lean protein foods contain the nutrients you need without too many calories. Decrease your intake of foods high in solid fats, added sugars, and salt. Eat the right amount of calories for you.Get information about a proper diet from your health care provider, if necessary.  Regular physical exercise is one of the most important things you can do for your health. Most adults should get at least 150 minutes of moderate-intensity exercise (any activity that increases your heart rate and causes you to sweat) each week. In addition, most adults need muscle-strengthening exercises on 2 or more days a week.  Maintain a healthy weight. The body mass index (BMI) is a screening tool to identify possible weight problems. It provides an estimate of body fat based on height and weight. Your health care provider can find your BMI and can help you achieve or maintain a healthy weight.For adults 20 years and older:  A BMI below 18.5 is considered underweight.  A BMI of 18.5 to 24.9 is normal.  A BMI of 25 to 29.9 is considered overweight.  A BMI of 30 and above is considered obese.  Maintain normal blood lipids and cholesterol levels by exercising and minimizing your intake of saturated fat. Eat a balanced diet with plenty of fruit and vegetables. Blood tests for lipids and cholesterol should begin at age 29 and be repeated every 5 years. If your lipid or cholesterol levels are high, you are over 50, or you are at high risk for heart disease, you may need your cholesterol levels checked more frequently.Ongoing high lipid and cholesterol levels should be treated with medicines if  diet and exercise are not working.  If you smoke, find out from your health care provider how to quit. If you do not use tobacco, do not start.  Lung cancer screening is recommended for adults aged 58-80 years who are at high risk for developing lung cancer because of a history of smoking. A yearly low-dose CT scan of the lungs is recommended for people who have at least a 30-pack-year history of smoking and are a current smoker or have quit within the past 15 years. A pack year of smoking is smoking an average of 1 pack of cigarettes a day for 1 year (for example: 1 pack a day for 30 years or 2 packs a day for 15 years). Yearly screening should continue until the smoker has stopped smoking  for at least 15 years. Yearly screening should be stopped for people who develop a health problem that would prevent them from having lung cancer treatment.  If you are pregnant, do not drink alcohol. If you are breastfeeding, be very cautious about drinking alcohol. If you are not pregnant and choose to drink alcohol, do not have more than 1 drink per day. One drink is considered to be 12 ounces (355 mL) of beer, 5 ounces (148 mL) of wine, or 1.5 ounces (44 mL) of liquor.  Avoid use of street drugs. Do not share needles with anyone. Ask for help if you need support or instructions about stopping the use of drugs.  High blood pressure causes heart disease and increases the risk of stroke. Your blood pressure should be checked at least every 1 to 2 years. Ongoing high blood pressure should be treated with medicines if weight loss and exercise do not work.  If you are 1-66 years old, ask your health care provider if you should take aspirin to prevent strokes.  Diabetes screening is done by taking a blood sample to check your blood glucose level after you have not eaten for a certain period of time (fasting). If you are not overweight and you do not have risk factors for diabetes, you should be screened once every 3  years starting at age 48. If you are overweight or obese and you are 41-40 years of age, you should be screened for diabetes every year as part of your cardiovascular risk assessment.  Breast cancer screening is essential preventive care for women. You should practice "breast self-awareness." This means understanding the normal appearance and feel of your breasts and may include breast self-examination. Any changes detected, no matter how small, should be reported to a health care provider. Women in their 52s and 30s should have a clinical breast exam (CBE) by a health care provider as part of a regular health exam every 1 to 3 years. After age 40, women should have a CBE every year. Starting at age 59, women should consider having a mammogram (breast X-ray test) every year. Women who have a family history of breast cancer should talk to their health care provider about genetic screening. Women at a high risk of breast cancer should talk to their health care providers about having an MRI and a mammogram every year.  Breast cancer gene (BRCA)-related cancer risk assessment is recommended for women who have family members with BRCA-related cancers. BRCA-related cancers include breast, ovarian, tubal, and peritoneal cancers. Having family members with these cancers may be associated with an increased risk for harmful changes (mutations) in the breast cancer genes BRCA1 and BRCA2. Results of the assessment will determine the need for genetic counseling and BRCA1 and BRCA2 testing.  Your health care provider may recommend that you be screened regularly for cancer of the pelvic organs (ovaries, uterus, and vagina). This screening involves a pelvic examination, including checking for microscopic changes to the surface of your cervix (Pap test). You may be encouraged to have this screening done every 3 years, beginning at age 51.  For women ages 79-65, health care providers may recommend pelvic exams and Pap testing  every 3 years, or they may recommend the Pap and pelvic exam, combined with testing for human papilloma virus (HPV), every 5 years. Some types of HPV increase your risk of cervical cancer. Testing for HPV may also be done on women of any age with unclear Pap test results.  Other  health care providers may not recommend any screening for nonpregnant women who are considered low risk for pelvic cancer and who do not have symptoms. Ask your health care provider if a screening pelvic exam is right for you.  If you have had past treatment for cervical cancer or a condition that could lead to cancer, you need Pap tests and screening for cancer for at least 20 years after your treatment. If Pap tests have been discontinued, your risk factors (such as having a new sexual partner) need to be reassessed to determine if screening should resume. Some women have medical problems that increase the chance of getting cervical cancer. In these cases, your health care provider may recommend more frequent screening and Pap tests.  Colorectal cancer can be detected and often prevented. Most routine colorectal cancer screening begins at the age of 15 years and continues through age 83 years. However, your health care provider may recommend screening at an earlier age if you have risk factors for colon cancer. On a yearly basis, your health care provider may provide home test kits to check for hidden blood in the stool. Use of a small camera at the end of a tube, to directly examine the colon (sigmoidoscopy or colonoscopy), can detect the earliest forms of colorectal cancer. Talk to your health care provider about this at age 36, when routine screening begins. Direct exam of the colon should be repeated every 5-10 years through age 10 years, unless early forms of precancerous polyps or small growths are found.  People who are at an increased risk for hepatitis B should be screened for this virus. You are considered at high risk  for hepatitis B if:  You were born in a country where hepatitis B occurs often. Talk with your health care provider about which countries are considered high risk.  Your parents were born in a high-risk country and you have not received a shot to protect against hepatitis B (hepatitis B vaccine).  You have HIV or AIDS.  You use needles to inject street drugs.  You live with, or have sex with, someone who has hepatitis B.  You get hemodialysis treatment.  You take certain medicines for conditions like cancer, organ transplantation, and autoimmune conditions.  Hepatitis C blood testing is recommended for all people born from [birth year] through 1965 and any individual with known risks for hepatitis C.  Practice safe sex. Use condoms and avoid high-risk sexual practices to reduce the spread of sexually transmitted infections (STIs). STIs include gonorrhea, chlamydia, syphilis, trichomonas, herpes, HPV, and human immunodeficiency virus (HIV). Herpes, HIV, and HPV are viral illnesses that have no cure. They can result in disability, cancer, and death.  You should be screened for sexually transmitted illnesses (STIs) including gonorrhea and chlamydia if:  You are sexually active and are younger than 24 years.  You are older than 24 years and your health care provider tells you that you are at risk for this type of infection.  Your sexual activity has changed since you were last screened and you are at an increased risk for chlamydia or gonorrhea. Ask your health care provider if you are at risk.  If you are at risk of being infected with HIV, it is recommended that you take a prescription medicine daily to prevent HIV infection. This is called preexposure prophylaxis (PrEP). You are considered at risk if:  You are sexually active and do not regularly use condoms or know the HIV status of your partner(s).  You take drugs by injection.  You are sexually active with a partner who has HIV.  Talk  with your health care provider about whether you are at high risk of being infected with HIV. If you choose to begin PrEP, you should first be tested for HIV. You should then be tested every 3 months for as long as you are taking PrEP.  Osteoporosis is a disease in which the bones lose minerals and strength with aging. This can result in serious bone fractures or breaks. The risk of osteoporosis can be identified using a bone density scan. Women ages 25 years and over and women at risk for fractures or osteoporosis should discuss screening with their health care providers. Ask your health care provider whether you should take a calcium supplement or vitamin D to reduce the rate of osteoporosis.  Menopause can be associated with physical symptoms and risks. Hormone replacement therapy is available to decrease symptoms and risks. You should talk to your health care provider about whether hormone replacement therapy is right for you.  Use sunscreen. Apply sunscreen liberally and repeatedly throughout the day. You should seek shade when your shadow is shorter than you. Protect yourself by wearing long sleeves, pants, a wide-brimmed hat, and sunglasses year round, whenever you are outdoors.  Once a month, do a whole body skin exam, using a mirror to look at the skin on your back. Tell your health care provider of new moles, moles that have irregular borders, moles that are larger than a pencil eraser, or moles that have changed in shape or color.  Stay current with required vaccines (immunizations).  Influenza vaccine. All adults should be immunized every year.  Tetanus, diphtheria, and acellular pertussis (Td, Tdap) vaccine. Pregnant women should receive 1 dose of Tdap vaccine during each pregnancy. The dose should be obtained regardless of the length of time since the last dose. Immunization is preferred during the 27th-36th week of gestation. An adult who has not previously received Tdap or who does not  know her vaccine status should receive 1 dose of Tdap. This initial dose should be followed by tetanus and diphtheria toxoids (Td) booster doses every 10 years. Adults with an unknown or incomplete history of completing a 3-dose immunization series with Td-containing vaccines should begin or complete a primary immunization series including a Tdap dose. Adults should receive a Td booster every 10 years.  Varicella vaccine. An adult without evidence of immunity to varicella should receive 2 doses or a second dose if she has previously received 1 dose. Pregnant females who do not have evidence of immunity should receive the first dose after pregnancy. This first dose should be obtained before leaving the health care facility. The second dose should be obtained 4-8 weeks after the first dose.  Human papillomavirus (HPV) vaccine. Females aged 13-26 years who have not received the vaccine previously should obtain the 3-dose series. The vaccine is not recommended for use in pregnant females. However, pregnancy testing is not needed before receiving a dose. If a female is found to be pregnant after receiving a dose, no treatment is needed. In that case, the remaining doses should be delayed until after the pregnancy. Immunization is recommended for any person with an immunocompromised condition through the age of 64 years if she did not get any or all doses earlier. During the 3-dose series, the second dose should be obtained 4-8 weeks after the first dose. The third dose should be obtained 24 weeks after the first  dose and 16 weeks after the second dose.  Zoster vaccine. One dose is recommended for adults aged 45 years or older unless certain conditions are present.  Measles, mumps, and rubella (MMR) vaccine. Adults born before [birth year] generally are considered immune to measles and mumps. Adults born in [birth year] or later should have 1 or more doses of MMR vaccine unless there is a contraindication to the vaccine or there  is laboratory evidence of immunity to each of the three diseases. A routine second dose of MMR vaccine should be obtained at least 28 days after the first dose for students attending postsecondary schools, health care workers, or international travelers. People who received inactivated measles vaccine or an unknown type of measles vaccine during 1963-1967 should receive 2 doses of MMR vaccine. People who received inactivated mumps vaccine or an unknown type of mumps vaccine before 1979 and are at high risk for mumps infection should consider immunization with 2 doses of MMR vaccine. For females of childbearing age, rubella immunity should be determined. If there is no evidence of immunity, females who are not pregnant should be vaccinated. If there is no evidence of immunity, females who are pregnant should delay immunization until after pregnancy. Unvaccinated health care workers born before [birth year] who lack laboratory evidence of measles, mumps, or rubella immunity or laboratory confirmation of disease should consider measles and mumps immunization with 2 doses of MMR vaccine or rubella immunization with 1 dose of MMR vaccine.  Pneumococcal 13-valent conjugate (PCV13) vaccine. When indicated, a person who is uncertain of his immunization history and has no record of immunization should receive the PCV13 vaccine. All adults 47 years of age and older should receive this vaccine. An adult aged 48 years or older who has certain medical conditions and has not been previously immunized should receive 1 dose of PCV13 vaccine. This PCV13 should be followed with a dose of pneumococcal polysaccharide (PPSV23) vaccine. Adults who are at high risk for pneumococcal disease should obtain the PPSV23 vaccine at least 8 weeks after the dose of PCV13 vaccine. Adults older than 56 years of age who have normal immune system function should obtain the PPSV23 vaccine dose at least 1 year after the dose of PCV13 vaccine.  Pneumococcal  polysaccharide (PPSV23) vaccine. When PCV13 is also indicated, PCV13 should be obtained first. All adults aged 61 years and older should be immunized. An adult younger than age 35 years who has certain medical conditions should be immunized. Any person who resides in a nursing home or long-term care facility should be immunized. An adult smoker should be immunized. People with an immunocompromised condition and certain other conditions should receive both PCV13 and PPSV23 vaccines. People with human immunodeficiency virus (HIV) infection should be immunized as soon as possible after diagnosis. Immunization during chemotherapy or radiation therapy should be avoided. Routine use of PPSV23 vaccine is not recommended for American Indians, [place] Natives, or people younger than 65 years unless there are medical conditions that require PPSV23 vaccine. When indicated, people who have unknown immunization and have no record of immunization should receive PPSV23 vaccine. One-time revaccination 5 years after the first dose of PPSV23 is recommended for people aged 19-64 years who have chronic kidney failure, nephrotic syndrome, asplenia, or immunocompromised conditions. People who received 1-2 doses of PPSV23 before age 46 years should receive another dose of PPSV23 vaccine at age 33 years or later if at least 5 years have passed since the previous dose. Doses of PPSV23 are not needed for  people immunized with PPSV23 at or after age 34 years.  Meningococcal vaccine. Adults with asplenia or persistent complement component deficiencies should receive 2 doses of quadrivalent meningococcal conjugate (MenACWY-D) vaccine. The doses should be obtained at least 2 months apart. Microbiologists working with certain meningococcal bacteria, [employer] recruits, people at risk during an outbreak, and people who travel to or live in countries with a high rate of meningitis should be immunized. A first-year college student up through age 77  years who is living in a residence [place] should receive a dose if she did not receive a dose on or after her 16th birthday. Adults who have certain high-risk conditions should receive one or more doses of vaccine.  Hepatitis A vaccine. Adults who wish to be protected from this disease, have certain high-risk conditions, work with hepatitis A-infected animals, work in hepatitis A research labs, or travel to or work in countries with a high rate of hepatitis A should be immunized. Adults who were previously unvaccinated and who anticipate close contact with an international adoptee during the first 60 days after arrival in the [place] from a country with a high rate of hepatitis A should be immunized.  Hepatitis B vaccine. Adults who wish to be protected from this disease, have certain high-risk conditions, may be exposed to blood or other infectious body fluids, are household contacts or sex partners of hepatitis B positive people, are clients or workers in certain care facilities, or travel to or work in countries with a high rate of hepatitis B should be immunized.  Haemophilus influenzae type b (Hib) vaccine. A previously unvaccinated person with asplenia or sickle cell disease or having a scheduled splenectomy should receive 1 dose of Hib vaccine. Regardless of previous immunization, a recipient of a hematopoietic stem cell transplant should receive a 3-dose series 6-12 months after her successful transplant. Hib vaccine is not recommended for adults with HIV infection. Preventive Services / Frequency Ages 34 to 63 years  Blood pressure check.** / Every 3-5 years.  Lipid and cholesterol check.** / Every 5 years beginning at age 12.  Clinical breast exam.** / Every 3 years for women in their 51s and 35s.  BRCA-related cancer risk assessment.** / For women who have family members with a BRCA-related cancer (breast, ovarian, tubal, or peritoneal cancers).  Pap test.** / Every 2 years from ages  59 through 44. Every 3 years starting at age 32 through age 46 or 54 with a history of 3 consecutive normal Pap tests.  HPV screening.** / Every 3 years from ages 59 through ages 61 to 1 with a history of 3 consecutive normal Pap tests.  Hepatitis C blood test.** / For any individual with known risks for hepatitis C.  Skin self-exam. / Monthly.  Influenza vaccine. / Every year.  Tetanus, diphtheria, and acellular pertussis (Tdap, Td) vaccine.** / Consult your health care provider. Pregnant women should receive 1 dose of Tdap vaccine during each pregnancy. 1 dose of Td every 10 years.  Varicella vaccine.** / Consult your health care provider. Pregnant females who do not have evidence of immunity should receive the first dose after pregnancy.  HPV vaccine. / 3 doses over 6 months, if 7 and younger. The vaccine is not recommended for use in pregnant females. However, pregnancy testing is not needed before receiving a dose.  Measles, mumps, rubella (MMR) vaccine.** / You need at least 1 dose of MMR if you were born in 1957 or later. You may also need a  2nd dose. For females of childbearing age, rubella immunity should be determined. If there is no evidence of immunity, females who are not pregnant should be vaccinated. If there is no evidence of immunity, females who are pregnant should delay immunization until after pregnancy.  Pneumococcal 13-valent conjugate (PCV13) vaccine.** / Consult your health care provider.  Pneumococcal polysaccharide (PPSV23) vaccine.** / 1 to 2 doses if you smoke cigarettes or if you have certain conditions.  Meningococcal vaccine.** / 1 dose if you are age 74 to 23 years and a Market researcher living in a residence [place], or have one of several medical conditions, you need to get vaccinated against meningococcal disease. You may also need additional booster doses.  Hepatitis A vaccine.** / Consult your health care provider.  Hepatitis B vaccine.** /  Consult your health care provider.  Haemophilus influenzae type b (Hib) vaccine.** / Consult your health care provider. Ages 48 to 38 years  Blood pressure check.** / Every year.  Lipid and cholesterol check.** / Every 5 years beginning at age 5 years.  Lung cancer screening. / Every year if you are aged 75-80 years and have a 30-pack-year history of smoking and currently smoke or have quit within the past 15 years. Yearly screening is stopped once you have quit smoking for at least 15 years or develop a health problem that would prevent you from having lung cancer treatment.  Clinical breast exam.** / Every year after age 50 years.  BRCA-related cancer risk assessment.** / For women who have family members with a BRCA-related cancer (breast, ovarian, tubal, or peritoneal cancers).  Mammogram.** / Every year beginning at age 68 years and continuing for as long as you are in good health. Consult with your health care provider.  Pap test.** / Every 3 years starting at age 42 years through age 31 or 64 years with a history of 3 consecutive normal Pap tests.  HPV screening.** / Every 3 years from ages 63 years through ages 69 to 60 years with a history of 3 consecutive normal Pap tests.  Fecal occult blood test (FOBT) of stool. / Every year beginning at age 63 years and continuing until age 28 years. You may not need to do this test if you get a colonoscopy every 10 years.  Flexible sigmoidoscopy or colonoscopy.** / Every 5 years for a flexible sigmoidoscopy or every 10 years for a colonoscopy beginning at age 51 years and continuing until age 67 years.  Hepatitis C blood test.** / For all people born from [birth year] through 1967 and any individual with known risks for hepatitis C.  Skin self-exam. / Monthly.  Influenza vaccine. / Every year.  Tetanus, diphtheria, and acellular pertussis (Tdap/Td) vaccine.** / Consult your health care provider. Pregnant women should receive 1 dose of Tdap  vaccine during each pregnancy. 1 dose of Td every 10 years.  Varicella vaccine.** / Consult your health care provider. Pregnant females who do not have evidence of immunity should receive the first dose after pregnancy.  Zoster vaccine.** / 1 dose for adults aged 57 years or older.  Measles, mumps, rubella (MMR) vaccine.** / You need at least 1 dose of MMR if you were born in 1957 or later. You may also need a second dose. For females of childbearing age, rubella immunity should be determined. If there is no evidence of immunity, females who are not pregnant should be vaccinated. If there is no evidence of immunity, females who are pregnant should delay immunization until after  pregnancy.  Pneumococcal 13-valent conjugate (PCV13) vaccine.** / Consult your health care provider.  Pneumococcal polysaccharide (PPSV23) vaccine.** / 1 to 2 doses if you smoke cigarettes or if you have certain conditions.  Meningococcal vaccine.** / Consult your health care provider.  Hepatitis A vaccine.** / Consult your health care provider.  Hepatitis B vaccine.** / Consult your health care provider.  Haemophilus influenzae type b (Hib) vaccine.** / Consult your health care provider. Ages 43 years and over  Blood pressure check.** / Every year.  Lipid and cholesterol check.** / Every 5 years beginning at age 55 years.  Lung cancer screening. / Every year if you are aged 84-80 years and have a 30-pack-year history of smoking and currently smoke or have quit within the past 15 years. Yearly screening is stopped once you have quit smoking for at least 15 years or develop a health problem that would prevent you from having lung cancer treatment.  Clinical breast exam.** / Every year after age 90 years.  BRCA-related cancer risk assessment.** / For women who have family members with a BRCA-related cancer (breast, ovarian, tubal, or peritoneal cancers).  Mammogram.** / Every year beginning at age 74 years and  continuing for as long as you are in good health. Consult with your health care provider.  Pap test.** / Every 3 years starting at age 3 years through age 80 or 9 years with 3 consecutive normal Pap tests. Testing can be stopped between 65 and 70 years with 3 consecutive normal Pap tests and no abnormal Pap or HPV tests in the past 10 years.  HPV screening.** / Every 3 years from ages 78 years through ages 26 or 66 years with a history of 3 consecutive normal Pap tests. Testing can be stopped between 65 and 70 years with 3 consecutive normal Pap tests and no abnormal Pap or HPV tests in the past 10 years.  Fecal occult blood test (FOBT) of stool. / Every year beginning at age 32 years and continuing until age 70 years. You may not need to do this test if you get a colonoscopy every 10 years.  Flexible sigmoidoscopy or colonoscopy.** / Every 5 years for a flexible sigmoidoscopy or every 10 years for a colonoscopy beginning at age 49 years and continuing until age 37 years.  Hepatitis C blood test.** / For all people born from [birth year] through 1967 and any individual with known risks for hepatitis C.  Osteoporosis screening.** / A one-time screening for women ages 21 years and over and women at risk for fractures or osteoporosis.  Skin self-exam. / Monthly.  Influenza vaccine. / Every year.  Tetanus, diphtheria, and acellular pertussis (Tdap/Td) vaccine.** / 1 dose of Td every 10 years.  Varicella vaccine.** / Consult your health care provider.  Zoster vaccine.** / 1 dose for adults aged 14 years or older.  Pneumococcal 13-valent conjugate (PCV13) vaccine.** / Consult your health care provider.  Pneumococcal polysaccharide (PPSV23) vaccine.** / 1 dose for all adults aged 82 years and older.  Meningococcal vaccine.** / Consult your health care provider.  Hepatitis A vaccine.** / Consult your health care provider.  Hepatitis B vaccine.** / Consult your health care provider.  Haemophilus  influenzae type b (Hib) vaccine.** / Consult your health care provider. ** Family history and personal history of risk and conditions may change your health care provider's recommendations.   This information is not intended to replace advice given to you by your health care provider. Make sure you discuss any questions  you have with your health care provider.   Document Released: 08/24/2001 Document Revised: 07/19/2014 Document Reviewed: 11/23/2010 Elsevier Interactive Patient Education [employer].

## 2015-04-29 LAB — HEPATITIS C ANTIBODY: HCV Ab: NEGATIVE

## 2015-04-29 LAB — URINE CULTURE: Colony Count: 5000

## 2015-04-29 LAB — HIV ANTIBODY (ROUTINE TESTING W REFLEX): HIV 1&2 Ab, 4th Generation: NONREACTIVE

## 2015-05-16 ENCOUNTER — Other Ambulatory Visit: Payer: Self-pay | Admitting: Family Medicine

## 2015-05-20 ENCOUNTER — Other Ambulatory Visit: Payer: Self-pay | Admitting: Family Medicine

## 2015-06-20 ENCOUNTER — Other Ambulatory Visit: Payer: Self-pay | Admitting: Family Medicine

## 2015-09-15 DIAGNOSIS — H52223 Regular astigmatism, bilateral: Secondary | ICD-10-CM | POA: Diagnosis not present

## 2015-09-15 DIAGNOSIS — H40013 Open angle with borderline findings, low risk, bilateral: Secondary | ICD-10-CM | POA: Diagnosis not present

## 2015-09-15 DIAGNOSIS — H524 Presbyopia: Secondary | ICD-10-CM | POA: Diagnosis not present

## 2015-09-15 DIAGNOSIS — E119 Type 2 diabetes mellitus without complications: Secondary | ICD-10-CM | POA: Diagnosis not present

## 2015-09-15 DIAGNOSIS — H5203 Hypermetropia, bilateral: Secondary | ICD-10-CM | POA: Diagnosis not present

## 2015-09-16 ENCOUNTER — Ambulatory Visit (INDEPENDENT_AMBULATORY_CARE_PROVIDER_SITE_OTHER): Payer: Medicare Other | Admitting: Medical

## 2015-09-16 ENCOUNTER — Encounter: Payer: Self-pay | Admitting: Medical

## 2015-09-16 VITALS — BP 128/74 | HR 87 | Temp 98.1°F | Ht 63.0 in | Wt 269.0 lb

## 2015-09-16 DIAGNOSIS — J209 Acute bronchitis, unspecified: Secondary | ICD-10-CM

## 2015-09-16 DIAGNOSIS — J0100 Acute maxillary sinusitis, unspecified: Secondary | ICD-10-CM | POA: Diagnosis not present

## 2015-09-16 DIAGNOSIS — J4520 Mild intermittent asthma, uncomplicated: Secondary | ICD-10-CM | POA: Diagnosis not present

## 2015-09-16 MED ORDER — AZITHROMYCIN 250 MG PO TABS: ORAL_TABLET | ORAL | Status: DC

## 2015-09-16 MED ORDER — AZELASTINE HCL 0.1 % NA SOLN: 2.0000 | Freq: Two times a day (BID) | NASAL | Status: DC

## 2015-09-16 MED ORDER — BECLOMETHASONE DIPROPIONATE 40 MCG/ACT IN AERS: 2.0000 | INHALATION_SPRAY | Freq: Two times a day (BID) | RESPIRATORY_TRACT | Status: DC

## 2015-09-16 MED ORDER — ALBUTEROL SULFATE 108 (90 BASE) MCG/ACT IN AEPB: 2.0000 | INHALATION_SPRAY | Freq: Four times a day (QID) | RESPIRATORY_TRACT | Status: DC | PRN

## 2015-09-16 MED ORDER — HYDROCODONE-HOMATROPINE 5-1.5 MG/5ML PO SYRP: 5.0000 mL | ORAL_SOLUTION | Freq: Three times a day (TID) | ORAL | Status: DC | PRN

## 2015-09-16 NOTE — Patient Instructions (Addendum)
You appear to have bronchitis and sinusitis. Rest hydrate and tylenol for fever. I am prescribing cough medicine hycodan, and azithromycin  antibiotic. For your nasal congestion use your flonase  nasal steroid and I am adding astelin.  If you have to use neb treatment for wheezing every 6 hours then add qvar steroid inhaler.  You should gradually get better. If not then notify us and would recommend a chest xray.  Follow up in 7-10 days or as needed

## 2015-09-16 NOTE — Progress Notes (Signed)
Subjective:    Patient ID: Carla Jensen, female    DOB: 18-Aug-1960, 55 y.o.   MRN: [id number]  HPI  Pt in with 2 wks of nasal congestion, sneezing and itching eyes. About one week of sinus pressure chest congestion and productive cough. Saturday felt low grade fever.  Pt had some faint body aches that started one week ago.  Pt is on flonase presently.      Review of Systems  Constitutional: Positive for fever and fatigue. Negative for chills.  HENT: Positive for congestion, ear pain, postnasal drip, sinus pressure and sneezing. Negative for sore throat.   Respiratory: Positive for cough and wheezing. Negative for choking and shortness of breath.        Pt states used only neb machine twice past week.   Last wheezed little on Friday.  Cardiovascular: Negative for chest pain and palpitations.  Gastrointestinal: Negative for abdominal pain.  Musculoskeletal: Negative for back pain.  Neurological: Negative for dizziness and light-headedness.  Hematological: Negative for adenopathy. Does not bruise/bleed easily.  Psychiatric/Behavioral: Negative for behavioral problems and confusion.   Past Medical History  Diagnosis Date  . Asthma   . Arthritis   . Depression   . GERD (gastroesophageal reflux disease)   . Allergy   . Hypertension   . Chickenpox   . 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 Diabetes mellitus without complication ([place])   . Stroke [facility])     Social History   Social History  . Marital Status: Married    Spouse Name: N/A  . Number of Children: N/A  . Years of Education: N/A   Occupational History  .  [place]    retired Pharmacist, hospital    Social History Main Topics  . Smoking status: Never Smoker   . Smokeless tobacco: Never Used  . Alcohol Use: No  . Drug Use: No  . Sexual Activity:    Partners: Male   Other Topics Concern  . Not on file   Social History Narrative   Exercise--no--- because  of herniated disc    Past Surgical History  Procedure Laterality Date  . Knee arthroscopy Left 5/06  . Tendon repair Right 3/07    Elbow  . Achilles tendon repair Left 1/08    Dr.Norris  . Achilles tendon repair Right 7/09    Dr.Norris  . Elbow surgery      Family History  Problem Relation Age of Onset  . Arthritis Mother   . High blood pressure Mother   . Diabetes Mother   . High blood pressure Sister   . High blood pressure Father     No Known Allergies  Current Outpatient Prescriptions on File Prior to Visit  Medication Sig Dispense Refill  . amLODipine (NORVASC) 5 MG tablet Take 1 tablet (5 mg total) by mouth daily. 90 tablet 3  . aspirin 81 MG tablet Take 81 mg by mouth daily.    . Blood Glucose Monitoring Suppl (ONE TOUCH ULTRA SYSTEM KIT) W/DEVICE KIT 1 kit by Does not apply route once. Dx. 250.00 1 each 0  . CINNAMON PO Take 1,000 mg by mouth daily.    Marland Kitchen diltiazem (CARDIZEM CD) 120 MG 24 hr capsule TAKE 1 CAPSULE (120 MG TOTAL) BY MOUTH DAILY. 90 capsule 0  . ferrous sulfate 325 (65 FE) MG tablet Take 650 mg by mouth daily with breakfast.     . fluticasone (FLONASE) 50 MCG/ACT nasal spray Place 2  sprays into both nostrils daily. 16 g 5  . gabapentin (NEURONTIN) 300 MG capsule TAKE 1 CAPSULE (300 MG TOTAL) BY MOUTH 2 (TWO) TIMES DAILY. 60 capsule 5  . glucose blood test strip Use as instructed. Pt tests sugars once daily. Dx. 250.00 100 each 12  . hydrochlorothiazide (HYDRODIURIL) 25 MG tablet Take 1 tablet (25 mg total) by mouth daily. 30 tablet 11  . hydrOXYzine (ATARAX/VISTARIL) 25 MG tablet Take 1 tablet by mouth every 8 (eight) hours as needed. Dr. Elvera Lennox    . insulin regular (HUMULIN R) 100 units/mL injection 3 times a day (just before each meal), 25-25-20 units, and syringes 3/day (Patient taking differently: 3 times a day (just before each meal), 25-25-15 units, and syringes 3/day) 30 mL 11  . Lancets Thin MISC Use as directed. Pt tests sugars once daily. Dx.  250.00 100 each 12  . losartan (COZAAR) 50 MG tablet Take 1 tablet (50 mg total) by mouth daily. 30 tablet 5  . Multiple Vitamin (MULTIVITAMIN) tablet Take 1 tablet by mouth daily.    . naproxen sodium (ANAPROX) 220 MG tablet Take 220 mg by mouth daily as needed. For pain    . pantoprazole (PROTONIX) 40 MG tablet TAKE 1 TABLET (40 MG TOTAL) BY MOUTH DAILY. 30 tablet 5  . Senna (NATURAL VEGETABLE LAXATIVE) 65-325 MG TABS Take 1 tablet by mouth 2 (two) times daily.     Marland Kitchen tiZANidine (ZANAFLEX) 4 MG tablet TAKE 1 TABLET BY MOUTH EVERY 6 (SIX) HOURS AS NEEDED FOR MUSCLE SPASMS. 30 tablet 2  . vitamin C (ASCORBIC ACID) 500 MG tablet Take 1,000 mg by mouth daily.      No current facility-administered medications on file prior to visit.    BP 128/74 mmHg  Pulse 87  Temp(Src) 98.1 F (36.7 C) (Oral)  Ht '5\' 3"'$  (1.6 m)  Wt 269 lb (122.018 kg)  BMI 47.66 kg/m2  SpO2 98%  LMP 08/05/2010       Objective:   Physical Exam  General  Mental Status - Alert. General Appearance - Well groomed. Not in acute distress.  Skin Rashes- No Rashes.  HEENT Head- Normal. Ear Auditory Canal - Left- Normal. Right - Normal.Tympanic Membrane- Left- Normal. Right- Normal. Eye Sclera/Conjunctiva- Left- Normal. Right- Normal. Nose & Sinuses Nasal Mucosa- Left-  Boggy and Congested. Right-  Boggy and  Congested.Bilateral maxillary and frontal sinus pressure. Mouth & Throat Lips: Upper Lip- Normal: no dryness, cracking, pallor, cyanosis, or vesicular eruption. Lower Lip-Normal: no dryness, cracking, pallor, cyanosis or vesicular eruption. Buccal Mucosa- Bilateral- No Aphthous ulcers. Oropharynx- No Discharge or Erythema. Tonsils: Characteristics- Bilateral- No Erythema or Congestion. Size/Enlargement- Bilateral- No enlargement. Discharge- bilateral-None.  Neck Neck- Supple. No Masses.   Chest and Lung Exam Auscultation: Breath Sounds:-Clear even and unlabored.  Cardiovascular Auscultation:Rythm-  Regular, rate and rhythm. Murmurs & Other Heart Sounds:Ausculatation of the heart reveal- No Murmurs.  Lymphatic Head & Neck General Head & Neck Lymphatics: Bilateral: Description- No Localized lymphadenopathy.       Assessment & Plan:  You appear to have bronchitis and sinusitis. Rest hydrate and tylenol for fever. I am prescribing cough medicine hycodan, and azithromycin  antibiotic. For your nasal congestion use your flonase  nasal steroid and I am adding astelin.  If you have to use neb treatment for wheezing every 6 hours then add qvar steroid inhaler.  You should gradually get better. If not then notify us and would recommend a chest xray.  Follow up in 7-10  days or as needed

## 2015-09-16 NOTE — Progress Notes (Signed)
Pre visit review using our clinic review tool, if applicable. No additional management support is needed unless otherwise documented below in the visit note. 

## 2015-09-23 ENCOUNTER — Other Ambulatory Visit: Payer: Self-pay

## 2015-09-23 ENCOUNTER — Telehealth: Payer: Self-pay | Admitting: *Deleted

## 2015-09-23 MED ORDER — TIZANIDINE HCL 4 MG PO TABS: ORAL_TABLET | ORAL | Status: DC

## 2015-09-23 NOTE — Telephone Encounter (Signed)
Received request for PA on QVAR Inhaler; initiated via Cover My Meds, awaiting response/SLS 03/14

## 2015-09-24 ENCOUNTER — Other Ambulatory Visit: Payer: Self-pay | Admitting: Medical

## 2015-09-24 MED ORDER — FLUTICASONE PROPIONATE HFA 110 MCG/ACT IN AERO: 2.0000 | INHALATION_SPRAY | Freq: Two times a day (BID) | RESPIRATORY_TRACT | Status: DC

## 2015-09-24 NOTE — Progress Notes (Signed)
Sent Rx for Flovent to patients pharmacy per insurance requirements.

## 2015-09-24 NOTE — Telephone Encounter (Signed)
Received Denial letter for QVAR because "medication is not on the drug list [formulary]; patient would need to first try two [2] of the following covered medications: a)Arnuity Ellipta, b) Flovent Diskus Or Flovent HFA, Or there are medical reason[s] why the alternative medications are not appropriate to treat your medical condition"/SLS 03/15 Please Advise.

## 2015-09-24 NOTE — Telephone Encounter (Signed)
Sent Flovent to pharmacy.

## 2015-09-24 NOTE — Telephone Encounter (Signed)
rx flovent in place of qvar

## 2015-10-16 ENCOUNTER — Other Ambulatory Visit: Payer: Self-pay | Admitting: Family Medicine

## 2015-12-11 ENCOUNTER — Other Ambulatory Visit: Payer: Self-pay | Admitting: Family Medicine

## 2015-12-20 ENCOUNTER — Other Ambulatory Visit: Payer: Self-pay | Admitting: Family Medicine

## 2015-12-22 ENCOUNTER — Telehealth: Payer: Self-pay

## 2015-12-22 MED ORDER — TIZANIDINE HCL 4 MG PO TABS
ORAL_TABLET | ORAL | Status: DC
Start: 2015-12-22 — End: 2016-01-09

## 2015-12-22 NOTE — Telephone Encounter (Signed)
Lena, DO  Ewing Schlein, CMA   Pt needs f/u  Ok to refill meds x 1 month  Needs labs too

## 2015-12-22 NOTE — Telephone Encounter (Signed)
Left message on VM informing patient of need to schedule a fasting appointment

## 2015-12-22 NOTE — Addendum Note (Signed)
Addended by: Ewing Schlein on: 12/22/2015 10:06 AM   Modules accepted: Orders

## 2015-12-22 NOTE — Telephone Encounter (Signed)
Please schedule this patient a follow up per MD/fasting.     KP

## 2015-12-22 NOTE — Telephone Encounter (Signed)
error 

## 2015-12-22 NOTE — Telephone Encounter (Signed)
Last seen 04/28/15 and filled 09/23/15 #30 with 2    Please advise    KP

## 2016-01-02 ENCOUNTER — Ambulatory Visit: Payer: Medicare Other | Admitting: Family Medicine

## 2016-01-09 ENCOUNTER — Other Ambulatory Visit: Payer: Self-pay

## 2016-01-09 ENCOUNTER — Ambulatory Visit (INDEPENDENT_AMBULATORY_CARE_PROVIDER_SITE_OTHER): Payer: Medicare Other | Admitting: Family Medicine

## 2016-01-09 ENCOUNTER — Encounter: Payer: Self-pay | Admitting: Family Medicine

## 2016-01-09 VITALS — BP 138/82 | HR 86 | Temp 99.2°F | Ht 63.0 in | Wt 277.6 lb

## 2016-01-09 DIAGNOSIS — K219 Gastro-esophageal reflux disease without esophagitis: Secondary | ICD-10-CM

## 2016-01-09 DIAGNOSIS — I10 Essential (primary) hypertension: Secondary | ICD-10-CM | POA: Diagnosis not present

## 2016-01-09 DIAGNOSIS — R05 Cough: Secondary | ICD-10-CM

## 2016-01-09 DIAGNOSIS — E785 Hyperlipidemia, unspecified: Secondary | ICD-10-CM | POA: Diagnosis not present

## 2016-01-09 DIAGNOSIS — E1151 Type 2 diabetes mellitus with diabetic peripheral angiopathy without gangrene: Secondary | ICD-10-CM

## 2016-01-09 DIAGNOSIS — IMO0002 Reserved for concepts with insufficient information to code with codable children: Secondary | ICD-10-CM

## 2016-01-09 DIAGNOSIS — R059 Cough, unspecified: Secondary | ICD-10-CM

## 2016-01-09 DIAGNOSIS — M5126 Other intervertebral disc displacement, lumbar region: Secondary | ICD-10-CM

## 2016-01-09 DIAGNOSIS — E1140 Type 2 diabetes mellitus with diabetic neuropathy, unspecified: Secondary | ICD-10-CM

## 2016-01-09 DIAGNOSIS — L282 Other prurigo: Secondary | ICD-10-CM

## 2016-01-09 DIAGNOSIS — J302 Other seasonal allergic rhinitis: Secondary | ICD-10-CM

## 2016-01-09 DIAGNOSIS — E1165 Type 2 diabetes mellitus with hyperglycemia: Secondary | ICD-10-CM

## 2016-01-09 DIAGNOSIS — M62838 Other muscle spasm: Secondary | ICD-10-CM

## 2016-01-09 DIAGNOSIS — Z794 Long term (current) use of insulin: Secondary | ICD-10-CM

## 2016-01-09 LAB — LIPID PANEL
Cholesterol: 147 mg/dL (ref 0–200)
HDL: 52.6 mg/dL (ref >39.00)
LDL Cholesterol: 83 mg/dL (ref 0–99)
NonHDL: 93.99
Total CHOL/HDL Ratio: 3
Triglycerides: 53 mg/dL (ref 0.0–149.0)
VLDL: 10.6 mg/dL (ref 0.0–40.0)

## 2016-01-09 LAB — HEMOGLOBIN A1C: Hgb A1c MFr Bld: 6 % (ref 4.6–6.5)

## 2016-01-09 LAB — COMPREHENSIVE METABOLIC PANEL
ALT: 26 U/L (ref 0–35)
AST: 27 U/L (ref 0–37)
Albumin: 3.9 g/dL (ref 3.5–5.2)
Alkaline Phosphatase: 106 U/L (ref 39–117)
BUN: 16 mg/dL (ref 6–23)
CO2: 31 mEq/L (ref 19–32)
Calcium: 10.1 mg/dL (ref 8.4–10.5)
Chloride: 106 mEq/L (ref 96–112)
Creatinine, Ser: 0.88 mg/dL (ref 0.40–1.20)
GFR: 85.07 mL/min (ref >60.00)
Glucose, Bld: 51 mg/dL — ABNORMAL LOW (ref 70–99)
Potassium: 3.7 mEq/L (ref 3.5–5.1)
Sodium: 140 mEq/L (ref 135–145)
Total Bilirubin: 0.3 mg/dL (ref 0.2–1.2)
Total Protein: 8 g/dL (ref 6.0–8.3)

## 2016-01-09 MED ORDER — TIZANIDINE HCL 4 MG PO TABS: ORAL_TABLET | ORAL | Status: DC

## 2016-01-09 MED ORDER — BENZONATATE 100 MG PO CAPS: ORAL_CAPSULE | ORAL | Status: DC

## 2016-01-09 MED ORDER — LEVOCETIRIZINE DIHYDROCHLORIDE 5 MG PO TABS: 5.0000 mg | ORAL_TABLET | Freq: Every evening | ORAL | Status: DC

## 2016-01-09 MED ORDER — FLUTICASONE PROPIONATE 50 MCG/ACT NA SUSP: 2.0000 | Freq: Every day | NASAL | Status: DC

## 2016-01-09 MED ORDER — GABAPENTIN 300 MG PO CAPS: ORAL_CAPSULE | ORAL | Status: DC

## 2016-01-09 MED ORDER — PANTOPRAZOLE SODIUM 40 MG PO TBEC: DELAYED_RELEASE_TABLET | ORAL | Status: DC

## 2016-01-09 MED ORDER — HYDROXYZINE HCL 25 MG PO TABS: 25.0000 mg | ORAL_TABLET | Freq: Three times a day (TID) | ORAL | Status: DC | PRN

## 2016-01-09 MED ORDER — INSULIN REGULAR HUMAN 100 UNIT/ML IJ SOLN: INTRAMUSCULAR | Status: DC

## 2016-01-09 MED ORDER — AMLODIPINE BESYLATE 5 MG PO TABS: 5.0000 mg | ORAL_TABLET | Freq: Every day | ORAL | Status: DC

## 2016-01-09 MED ORDER — LOSARTAN POTASSIUM 50 MG PO TABS: ORAL_TABLET | ORAL | Status: DC

## 2016-01-09 NOTE — Patient Instructions (Signed)
Upper Respiratory Infection, Adult Most upper respiratory infections (URIs) are a viral infection of the air passages leading to the lungs. A URI affects the nose, throat, and upper air passages. The most common type of URI is nasopharyngitis and is typically referred to as "the common cold." URIs run their course and usually go away on their own. Most of the time, a URI does not require medical attention, but sometimes a bacterial infection in the upper airways can follow a viral infection. This is called a secondary infection. Sinus and middle ear infections are common types of secondary upper respiratory infections. Bacterial pneumonia can also complicate a URI. A URI can worsen asthma and chronic obstructive pulmonary disease (COPD). Sometimes, these complications can require emergency medical care and may be life threatening.  CAUSES Almost all URIs are caused by viruses. A virus is a type of germ and can spread from one person to another.  RISKS FACTORS You may be at risk for a URI if:   You smoke.   You have chronic heart or lung disease.  You have a weakened defense (immune) system.   You are very young or very old.   You have nasal allergies or asthma.  You work in crowded or poorly ventilated areas.  You work in health care facilities or schools. SIGNS AND SYMPTOMS  Symptoms typically develop 2-3 days after you come in contact with a cold virus. Most viral URIs last 7-10 days. However, viral URIs from the influenza virus (flu virus) can last 14-18 days and are typically more severe. Symptoms may include:   Runny or stuffy (congested) nose.   Sneezing.   Cough.   Sore throat.   Headache.   Fatigue.   Fever.   Loss of appetite.   Pain in your forehead, behind your eyes, and over your cheekbones (sinus pain).  Muscle aches.  DIAGNOSIS  Your health care provider may diagnose a URI by:  Physical exam.  Tests to check that your symptoms are not due to  another condition such as:  Strep throat.  Sinusitis.  Pneumonia.  Asthma. TREATMENT  A URI goes away on its own with time. It cannot be cured with medicines, but medicines may be prescribed or recommended to relieve symptoms. Medicines may help:  Reduce your fever.  Reduce your cough.  Relieve nasal congestion. HOME CARE INSTRUCTIONS   Take medicines only as directed by your health care provider.   Gargle warm saltwater or take cough drops to comfort your throat as directed by your health care provider.  Use a warm mist humidifier or inhale steam from a shower to increase air moisture. This may make it easier to breathe.  Drink enough fluid to keep your urine clear or pale yellow.   Eat soups and other clear broths and maintain good nutrition.   Rest as needed.   Return to work when your temperature has returned to normal or as your health care provider advises. You may need to stay home longer to avoid infecting others. You can also use a face mask and careful hand washing to prevent spread of the virus.  Increase the usage of your inhaler if you have asthma.   Do not use any tobacco products, including cigarettes, chewing tobacco, or electronic cigarettes. If you need help quitting, ask your health care provider. PREVENTION  The best way to protect yourself from getting a cold is to practice good hygiene.   Avoid oral or hand contact with people with cold   symptoms.   Wash your hands often if contact occurs.  There is no clear evidence that vitamin C, vitamin E, echinacea, or exercise reduces the chance of developing a cold. However, it is always recommended to get plenty of rest, exercise, and practice good nutrition.  SEEK MEDICAL CARE IF:   You are getting worse rather than better.   Your symptoms are not controlled by medicine.   You have chills.  You have worsening shortness of breath.  You have brown or red mucus.  You have yellow or brown nasal  discharge.  You have pain in your face, especially when you bend forward.  You have a fever.  You have swollen neck glands.  You have pain while swallowing.  You have white areas in the back of your throat. SEEK IMMEDIATE MEDICAL CARE IF:   You have severe or persistent:  Headache.  Ear pain.  Sinus pain.  Chest pain.  You have chronic lung disease and any of the following:  Wheezing.  Prolonged cough.  Coughing up blood.  A change in your usual mucus.  You have a stiff neck.  You have changes in your:  Vision.  Hearing.  Thinking.  Mood. MAKE SURE YOU:   Understand these instructions.  Will watch your condition.  Will get help right away if you are not doing well or get worse.   This information is not intended to replace advice given to you by your health care provider. Make sure you discuss any questions you have with your health care provider.   Document Released: 12/22/2000 Document Revised: 11/12/2014 Document Reviewed: 10/03/2013 Elsevier Interactive Patient Education 2016 [employer]  

## 2016-01-09 NOTE — Progress Notes (Signed)
Patient ID: Carla Jensen, female    DOB: 18-Sep-1960  Age: 55 y.o. MRN: [id number]    Subjective:  Subjective HPI Carla Jensen presents for f/u dm, cholesterol and htn.  bs levels have been good per pt. Pt is also c/o sinus congestion.  She has taken otc allergy med--with no relief---symptoms started Tuesday.  No fever.  + cough and tickle in throat.    HYPERTENSION  Blood pressure range-not checking  Chest pain- no      Dyspnea- no Lightheadedness- no   Edema- no Other side effects - no   Medication compliance: good Low salt diet- yes  DIABETES  Blood Sugar ranges-good per pr  Polyuria- no New Visual problems- no Hypoglycemic symptoms- no Other side effects-no Medication compliance - good Last eye exam- 10/2015 Foot exam- today  HYPERLIPIDEMIA  Medication compliance- na RUQ pain- no  Muscle aches- no Other side effects-no     Review of Systems  Constitutional: Negative for diaphoresis, appetite change, fatigue and unexpected weight change.  Eyes: Negative for pain, redness and visual disturbance.  Respiratory: Negative for cough, chest tightness, shortness of breath and wheezing.   Cardiovascular: Negative for chest pain, palpitations and leg swelling.  Endocrine: Negative for cold intolerance, heat intolerance, polydipsia, polyphagia and polyuria.  Genitourinary: Negative for dysuria, frequency and difficulty urinating.  Neurological: Negative for dizziness, light-headedness, numbness and headaches.    History Past Medical History  Diagnosis Date  . Asthma   . Arthritis   . Depression   . GERD (gastroesophageal reflux disease)   . Allergy   . Hypertension   . Chickenpox   . 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 Diabetes mellitus without complication ([place])   . Stroke [facility])     She has past surgical history that includes Knee arthroscopy (Left, 5/06); Tendon repair (Right, 3/07); Achilles tendon repair (Left,  1/08); Achilles tendon repair (Right, 7/09); and Elbow surgery.   Her family history includes Arthritis in her mother; Diabetes in her mother; High blood pressure in her father, mother, and sister.She reports that she has never smoked. She has never used smokeless tobacco. She reports that she does not drink alcohol or use illicit drugs.  Current Outpatient Prescriptions on File Prior to Visit  Medication Sig Dispense Refill  . Albuterol Sulfate (PROAIR RESPICLICK) 630 (90 Base) MCG/ACT AEPB Inhale 2 puffs into the lungs 4 (four) times daily as needed. 1 each 3  . aspirin 81 MG tablet Take 81 mg by mouth daily.    Marland Kitchen azelastine (ASTELIN) 0.1 % nasal spray Place 2 sprays into both nostrils 2 (two) times daily. Use in each nostril as directed 30 mL 3  . Blood Glucose Monitoring Suppl (ONE TOUCH ULTRA SYSTEM KIT) W/DEVICE KIT 1 kit by Does not apply route once. Dx. 250.00 1 each 0  . CINNAMON PO Take 1,000 mg by mouth daily.    . ferrous sulfate 325 (65 FE) MG tablet Take 650 mg by mouth daily with breakfast.     . fluticasone (FLONASE) 50 MCG/ACT nasal spray Place 2 sprays into both nostrils daily. 16 g 5  . fluticasone (FLOVENT HFA) 110 MCG/ACT inhaler Inhale 2 puffs into the lungs 2 (two) times daily. 1 Inhaler 1  . glucose blood test strip Use as instructed. Pt tests sugars once daily. Dx. 250.00 100 each 12  . hydrochlorothiazide (HYDRODIURIL) 25 MG tablet Take 1 tablet (25 mg total) by mouth daily. 30 tablet 11  .  Lancets Thin MISC Use as directed. Pt tests sugars once daily. Dx. 250.00 100 each 12  . Multiple Vitamin (MULTIVITAMIN) tablet Take 1 tablet by mouth daily.    . naproxen sodium (ANAPROX) 220 MG tablet Take 220 mg by mouth daily as needed. For pain    . Senna (NATURAL VEGETABLE LAXATIVE) 65-325 MG TABS Take 1 tablet by mouth 2 (two) times daily.     . vitamin C (ASCORBIC ACID) 500 MG tablet Take 1,000 mg by mouth daily.     Marland Kitchen diltiazem (CARDIZEM CD) 120 MG 24 hr capsule TAKE 1  CAPSULE (120 MG TOTAL) BY MOUTH DAILY. (Patient not taking: Reported on 01/09/2016) 90 capsule 0   No current facility-administered medications on file prior to visit.     Objective:  Objective Physical Exam  Constitutional: She is oriented to person, place, and time. She appears well-developed and well-nourished.  HENT:  Right Ear: External ear normal.  Left Ear: External ear normal.  Nose: Right sinus exhibits maxillary sinus tenderness and frontal sinus tenderness. Left sinus exhibits maxillary sinus tenderness and frontal sinus tenderness.  + PND + errythema  Eyes: Conjunctivae are normal. Right eye exhibits no discharge. Left eye exhibits no discharge.  Cardiovascular: Normal rate, regular rhythm and normal heart sounds.   No murmur heard. Pulmonary/Chest: Effort normal and breath sounds normal. No respiratory distress. She has no wheezes. She has no rales. She exhibits no tenderness.  Musculoskeletal: She exhibits no edema.  Lymphadenopathy:    She has cervical adenopathy.  Neurological: She is alert and oriented to person, place, and time.  Nursing note and vitals reviewed. Sensory exam of the foot is normal, tested with the monofilament. Good pulses, no lesions or ulcers, good peripheral pulses.  BP 138/82 mmHg  Pulse 86  Temp(Src) 99.2 F (37.3 C) (Oral)  Ht '5\' 3"'$  (1.6 m)  Wt 277 lb 9.6 oz (125.919 kg)  BMI 49.19 kg/m2  SpO2 98%  LMP 08/05/2010 Wt Readings from Last 3 Encounters:  01/09/16 277 lb 9.6 oz (125.919 kg)  09/16/15 269 lb (122.018 kg)  04/28/15 266 lb 9.6 oz (120.929 kg)     Lab Results  Component Value Date   WBC 6.8 04/28/2015   HGB 9.8* 04/28/2015   HCT 31.5* 04/28/2015   PLT 354.0 04/28/2015   GLUCOSE 51* 01/09/2016   CHOL 147 01/09/2016   TRIG 53.0 01/09/2016   HDL 52.60 01/09/2016   LDLCALC 83 01/09/2016   ALT 26 01/09/2016   AST 27 01/09/2016   NA 140 01/09/2016   K 3.7 01/09/2016   CL 106 01/09/2016   CREATININE 0.88 01/09/2016    BUN 16 01/09/2016   CO2 31 01/09/2016   TSH 1.74 04/28/2015   INR 1.07 10/21/2009   HGBA1C 6.0 01/09/2016   MICROALBUR <0.7 04/28/2015    Dg Chest 2 View  01/07/2014  CLINICAL DATA:  Leg injury.  Cough EXAM: CHEST  2 VIEW COMPARISON:  10/21/2009. FINDINGS: A right-sided catheter is identified which is of on certain etiology. The catheter appears to terminate in the projection of the cavoatrial junction. The heart size and mediastinal contours are within normal limits. Both lungs are clear. The visualized skeletal structures are unremarkable. IMPRESSION: No acute cardiopulmonary abnormalities. Electronically Signed   By: Kerby Moors M.D.   On: 01/07/2014 20:43   Ct Angio Chest Pe W/cm &/or Wo Cm  01/07/2014  CLINICAL DATA:  Shortness of breath, positive D-dimer EXAM: CT ANGIOGRAPHY CHEST WITH CONTRAST TECHNIQUE: Multidetector CT imaging  of the chest was performed using the standard protocol during bolus administration of intravenous contrast. Multiplanar CT image reconstructions and MIPs were obtained to evaluate the vascular anatomy. CONTRAST:  94m OMNIPAQUE IOHEXOL 350 MG/ML SOLN COMPARISON:  None. FINDINGS: There is adequate opacification of the central pulmonary arteries. There is no central pulmonary embolus. The segmental and subsegmental pulmonary artery branches are suboptimally opacified and are nondiagnostic for evaluation of pulmonary emboli. The main pulmonary artery, right main pulmonary artery and left main pulmonary arteries are normal in size. The heart size is normal. There is no pericardial effusion. The lungs are clear. There is no focal consolidation, pleural effusion or pneumothorax. There is no axillary, hilar, or mediastinal adenopathy. There is no lytic or blastic osseous lesion. There is mild thoracic spine spondylosis. The visualized portions of the upper abdomen are unremarkable. Review of the MIP images confirms the above findings. IMPRESSION: 1. There is adequate  opacification of the central pulmonary arteries. There is no central pulmonary embolus. The segmental and subsegmental pulmonary artery branches are suboptimally opacified and are nondiagnostic for evaluation of pulmonary emboli. Electronically Signed   By: HKathreen Devoid  On: 01/07/2014 21:43     Assessment & Plan:  Plan I have discontinued Ms. Lobosco's insulin regular, HYDROcodone-homatropine, and azithromycin. I have also changed her amLODipine and hydrOXYzine. Additionally, I am having her start on insulin regular, levocetirizine, fluticasone, and benzonatate. Lastly, I am having her maintain her multivitamin, vitamin C, NATURAL VEGETABLE LAXATIVE, ferrous sulfate, aspirin, CINNAMON PO, naproxen sodium, ONE TOUCH ULTRA SYSTEM KIT, glucose blood, Lancets Thin, fluticasone, hydrochlorothiazide, diltiazem, Albuterol Sulfate, azelastine, fluticasone, Insulin Syringe-Needle U-100, Insulin Regular Human (NOVOLIN R RELION IJ), gabapentin, losartan, pantoprazole, and tiZANidine.  Meds ordered this encounter  Medications  . Insulin Syringe-Needle U-100 (RELION INSULIN SYR 0.5ML/31G) 31G X 5/16" 0.5 ML MISC    Sig: by Does not apply route.  . Insulin Regular Human (NOVOLIN R RELION IJ)    Sig: Inject as directed. 3 times a day (just before each meal), 25-25-15 units, and syringes 3/day  . amLODipine (NORVASC) 5 MG tablet    Sig: Take 1 tablet (5 mg total) by mouth daily.    Dispense:  90 tablet    Refill:  1  . gabapentin (NEURONTIN) 300 MG capsule    Sig: TAKE 1 CAPSULE (300 MG TOTAL) BY MOUTH 2 (TWO) TIMES DAILY.    Dispense:  60 capsule    Refill:  5  . hydrOXYzine (ATARAX/VISTARIL) 25 MG tablet    Sig: Take 1 tablet (25 mg total) by mouth every 8 (eight) hours as needed. Dr. WElvera Lennox   Dispense:  30 tablet    Refill:  2  . losartan (COZAAR) 50 MG tablet    Sig: TAKE 1 TABLET (50 MG TOTAL) BY MOUTH DAILY.    Dispense:  90 tablet    Refill:  1  . pantoprazole (PROTONIX) 40 MG tablet    Sig:  TAKE 1 TABLET (40 MG TOTAL) BY MOUTH DAILY.    Dispense:  90 tablet    Refill:  1  . tiZANidine (ZANAFLEX) 4 MG tablet    Sig: TAKE 1 TABLET BY MOUTH EVERY 6 (SIX) HOURS AS NEEDED FOR MUSCLE SPASMS.    Dispense:  30 tablet    Refill:  2  . insulin regular (NOVOLIN R) 100 units/mL injection    Sig: 25 u sq in am and 25 u sq at noon, 20 u sq in pm    Dispense:  10 mL    Refill:  11  . levocetirizine (XYZAL) 5 MG tablet    Sig: Take 1 tablet (5 mg total) by mouth every evening.    Dispense:  30 tablet    Refill:  5  . fluticasone (FLONASE) 50 MCG/ACT nasal spray    Sig: Place 2 sprays into both nostrils daily.    Dispense:  16 g    Refill:  2  . benzonatate (TESSALON PERLES) 100 MG capsule    Sig: 1 -2 po tid prn cough    Dispense:  40 capsule    Refill:  0    Problem List Items Addressed This Visit      Unprioritized   HTN (hypertension)   Relevant Medications   amLODipine (NORVASC) 5 MG tablet   losartan (COZAAR) 50 MG tablet   Other Relevant Orders   Lipid panel (Completed)   Hemoglobin A1c (Completed)   Comprehensive metabolic panel (Completed)   GERD (gastroesophageal reflux disease)   Relevant Medications   pantoprazole (PROTONIX) 40 MG tablet   Diabetes (HCC)   Relevant Medications   Insulin Regular Human (NOVOLIN R RELION IJ)   gabapentin (NEURONTIN) 300 MG capsule   losartan (COZAAR) 50 MG tablet   insulin regular (NOVOLIN R) 100 units/mL injection   Lumbar herniated disc    Handicap parking form filled out       Other Visit Diagnoses    DM (diabetes mellitus) type II uncontrolled, periph vascular disorder (HCC)    -  Primary    Relevant Medications    Insulin Regular Human (NOVOLIN R RELION IJ)    amLODipine (NORVASC) 5 MG tablet    losartan (COZAAR) 50 MG tablet    insulin regular (NOVOLIN R) 100 units/mL injection    Other Relevant Orders    Lipid panel (Completed)    Hemoglobin A1c (Completed)    Comprehensive metabolic panel (Completed)     Hyperlipidemia LDL goal <70        Relevant Medications    amLODipine (NORVASC) 5 MG tablet    losartan (COZAAR) 50 MG tablet    Other Relevant Orders    Lipid panel (Completed)    Hemoglobin A1c (Completed)    Comprehensive metabolic panel (Completed)    Muscle spasm        Relevant Medications    tiZANidine (ZANAFLEX) 4 MG tablet    Pruritic rash        Relevant Medications    hydrOXYzine (ATARAX/VISTARIL) 25 MG tablet    Seasonal allergic rhinitis        Relevant Medications    levocetirizine (XYZAL) 5 MG tablet    fluticasone (FLONASE) 50 MCG/ACT nasal spray    Cough        Relevant Medications    benzonatate (TESSALON PERLES) 100 MG capsule       Follow-up: Return in about 6 months (around 07/10/2016), or if symptoms worsen or fail to improve, for hypertension, hyperlipidemia, diabetes II, annual exam, fasting.  Ann Held, DO

## 2016-01-09 NOTE — Assessment & Plan Note (Signed)
Handicap parking form filled out

## 2016-06-30 ENCOUNTER — Telehealth: Payer: Self-pay | Admitting: Family Medicine

## 2016-06-30 NOTE — Telephone Encounter (Signed)
LVM advising patient to schedule medicare wellness appointment

## 2016-09-11 ENCOUNTER — Other Ambulatory Visit: Payer: Self-pay | Admitting: Family Medicine

## 2016-09-11 DIAGNOSIS — I10 Essential (primary) hypertension: Secondary | ICD-10-CM

## 2016-10-06 ENCOUNTER — Other Ambulatory Visit: Payer: Self-pay | Admitting: Family Medicine

## 2016-10-06 DIAGNOSIS — K219 Gastro-esophageal reflux disease without esophagitis: Secondary | ICD-10-CM

## 2016-10-06 DIAGNOSIS — I10 Essential (primary) hypertension: Secondary | ICD-10-CM

## 2016-10-18 ENCOUNTER — Other Ambulatory Visit: Payer: Self-pay | Admitting: Family Medicine

## 2016-10-18 DIAGNOSIS — E1140 Type 2 diabetes mellitus with diabetic neuropathy, unspecified: Secondary | ICD-10-CM

## 2016-10-18 DIAGNOSIS — Z794 Long term (current) use of insulin: Principal | ICD-10-CM

## 2016-12-17 ENCOUNTER — Other Ambulatory Visit: Payer: Self-pay | Admitting: Family Medicine

## 2016-12-17 DIAGNOSIS — I10 Essential (primary) hypertension: Secondary | ICD-10-CM

## 2017-03-07 ENCOUNTER — Other Ambulatory Visit: Payer: Self-pay | Admitting: Pediatrics

## 2017-03-07 DIAGNOSIS — I10 Essential (primary) hypertension: Secondary | ICD-10-CM

## 2017-03-07 DIAGNOSIS — Z794 Long term (current) use of insulin: Secondary | ICD-10-CM

## 2017-03-07 DIAGNOSIS — K219 Gastro-esophageal reflux disease without esophagitis: Secondary | ICD-10-CM

## 2017-03-07 DIAGNOSIS — E1140 Type 2 diabetes mellitus with diabetic neuropathy, unspecified: Secondary | ICD-10-CM

## 2017-03-07 MED ORDER — PANTOPRAZOLE SODIUM 40 MG PO TBEC: DELAYED_RELEASE_TABLET | ORAL | 0 refills | Status: DC

## 2017-03-07 MED ORDER — GABAPENTIN 300 MG PO CAPS: ORAL_CAPSULE | ORAL | 1 refills | Status: DC

## 2017-03-07 MED ORDER — AMLODIPINE BESYLATE 5 MG PO TABS: 5.0000 mg | ORAL_TABLET | Freq: Every day | ORAL | 0 refills | Status: DC

## 2017-03-07 MED ORDER — LOSARTAN POTASSIUM 50 MG PO TABS: ORAL_TABLET | ORAL | 0 refills | Status: DC

## 2017-03-07 NOTE — Telephone Encounter (Signed)
Last OV/lab work 01/09/16, no appt sch, Protonix #90 1 rf last refilled 10/06/16, Gabapentin #60 5 rf 10/18/16, Losartan #90 1rf 10/06/16. Would you like to refill?

## 2017-03-07 NOTE — Telephone Encounter (Signed)
Patient called back. CPE sch 04/15/17, refills sent to [facility] (at patient's request). Patient voiced understanding and agreed with plan. She states she has enough medication until mid September.

## 2017-03-07 NOTE — Telephone Encounter (Signed)
Pt needs ov scheduled Can refill until appoinment

## 2017-03-07 NOTE — Telephone Encounter (Signed)
CPE sch 04/15/17-refill until appt.

## 2017-03-09 ENCOUNTER — Encounter: Payer: Self-pay | Admitting: Family Medicine

## 2017-03-09 ENCOUNTER — Other Ambulatory Visit: Payer: Self-pay | Admitting: Family Medicine

## 2017-03-09 DIAGNOSIS — I10 Essential (primary) hypertension: Secondary | ICD-10-CM

## 2017-03-09 DIAGNOSIS — R059 Cough, unspecified: Secondary | ICD-10-CM

## 2017-03-09 DIAGNOSIS — R05 Cough: Secondary | ICD-10-CM

## 2017-03-09 DIAGNOSIS — Z794 Long term (current) use of insulin: Principal | ICD-10-CM

## 2017-03-09 DIAGNOSIS — E1140 Type 2 diabetes mellitus with diabetic neuropathy, unspecified: Secondary | ICD-10-CM

## 2017-03-09 DIAGNOSIS — K219 Gastro-esophageal reflux disease without esophagitis: Secondary | ICD-10-CM

## 2017-03-09 MED ORDER — GABAPENTIN 300 MG PO CAPS: 300.0000 mg | ORAL_CAPSULE | Freq: Two times a day (BID) | ORAL | 0 refills | Status: DC

## 2017-03-09 MED ORDER — DILTIAZEM HCL ER COATED BEADS 120 MG PO CP24: 120.0000 mg | ORAL_CAPSULE | Freq: Every day | ORAL | 0 refills | Status: DC

## 2017-03-20 ENCOUNTER — Other Ambulatory Visit: Payer: Self-pay | Admitting: Family Medicine

## 2017-03-20 DIAGNOSIS — I10 Essential (primary) hypertension: Secondary | ICD-10-CM

## 2017-03-28 ENCOUNTER — Other Ambulatory Visit: Payer: Self-pay

## 2017-03-28 DIAGNOSIS — I10 Essential (primary) hypertension: Secondary | ICD-10-CM

## 2017-03-28 MED ORDER — AMLODIPINE BESYLATE 5 MG PO TABS: 5.0000 mg | ORAL_TABLET | Freq: Every day | ORAL | 0 refills | Status: DC

## 2017-03-28 NOTE — Progress Notes (Unsigned)
Received paper refill request for Amlodipine. Pt has an upcoming wellness visit. Sent in enough to get pt to appt

## 2017-04-03 ENCOUNTER — Other Ambulatory Visit: Payer: Self-pay | Admitting: Family Medicine

## 2017-04-03 DIAGNOSIS — K219 Gastro-esophageal reflux disease without esophagitis: Secondary | ICD-10-CM

## 2017-04-03 DIAGNOSIS — I10 Essential (primary) hypertension: Secondary | ICD-10-CM

## 2017-04-04 NOTE — Telephone Encounter (Signed)
These were faxed to [facility] in August/thx dmf

## 2017-04-11 ENCOUNTER — Other Ambulatory Visit: Payer: Self-pay

## 2017-04-11 DIAGNOSIS — I10 Essential (primary) hypertension: Secondary | ICD-10-CM

## 2017-04-11 MED ORDER — AMLODIPINE BESYLATE 5 MG PO TABS: 5.0000 mg | ORAL_TABLET | Freq: Every day | ORAL | 0 refills | Status: DC

## 2017-04-15 ENCOUNTER — Other Ambulatory Visit (HOSPITAL_COMMUNITY)
Admission: RE | Admit: 2017-04-15 | Discharge: 2017-04-15 | Disposition: A | Discharge status: Home / Self Care | Payer: Medicare Other | Source: Ambulatory Visit | Attending: Family Medicine | Admitting: Family Medicine

## 2017-04-15 ENCOUNTER — Ambulatory Visit (INDEPENDENT_AMBULATORY_CARE_PROVIDER_SITE_OTHER): Payer: Medicare Other | Admitting: Family Medicine

## 2017-04-15 ENCOUNTER — Encounter: Payer: Self-pay | Admitting: Family Medicine

## 2017-04-15 DIAGNOSIS — C539 Malignant neoplasm of cervix uteri, unspecified: Secondary | ICD-10-CM | POA: Insufficient documentation

## 2017-04-15 DIAGNOSIS — R8290 Unspecified abnormal findings in urine: Secondary | ICD-10-CM | POA: Diagnosis not present

## 2017-04-15 DIAGNOSIS — K625 Hemorrhage of anus and rectum: Secondary | ICD-10-CM | POA: Diagnosis not present

## 2017-04-15 DIAGNOSIS — E1151 Type 2 diabetes mellitus with diabetic peripheral angiopathy without gangrene: Secondary | ICD-10-CM | POA: Diagnosis not present

## 2017-04-15 DIAGNOSIS — J302 Other seasonal allergic rhinitis: Secondary | ICD-10-CM

## 2017-04-15 DIAGNOSIS — R319 Hematuria, unspecified: Secondary | ICD-10-CM | POA: Diagnosis not present

## 2017-04-15 DIAGNOSIS — L282 Other prurigo: Secondary | ICD-10-CM

## 2017-04-15 DIAGNOSIS — Z0001 Encounter for general adult medical examination with abnormal findings: Secondary | ICD-10-CM | POA: Diagnosis not present

## 2017-04-15 DIAGNOSIS — Z23 Encounter for immunization: Secondary | ICD-10-CM | POA: Diagnosis not present

## 2017-04-15 DIAGNOSIS — Z0000 Encounter for general adult medical examination without abnormal findings: Secondary | ICD-10-CM

## 2017-04-15 DIAGNOSIS — K649 Unspecified hemorrhoids: Secondary | ICD-10-CM | POA: Diagnosis not present

## 2017-04-15 DIAGNOSIS — I10 Essential (primary) hypertension: Secondary | ICD-10-CM

## 2017-04-15 DIAGNOSIS — K921 Melena: Secondary | ICD-10-CM | POA: Diagnosis not present

## 2017-04-15 LAB — LIPID PANEL
Cholesterol: 142 mg/dL (ref 0–200)
HDL: 55 mg/dL (ref >39.00)
LDL Cholesterol: 77 mg/dL (ref 0–99)
NonHDL: 87.35
Total CHOL/HDL Ratio: 3
Triglycerides: 53 mg/dL (ref 0.0–149.0)
VLDL: 10.6 mg/dL (ref 0.0–40.0)

## 2017-04-15 LAB — POC URINALSYSI DIPSTICK (AUTOMATED)
Bilirubin, UA: NEGATIVE
Glucose, UA: NEGATIVE
Ketones, UA: NEGATIVE
Nitrite, UA: NEGATIVE
Spec Grav, UA: >=1.03 — AB (ref 1.010–1.025)
Urobilinogen, UA: 0.2 E.U./dL
pH, UA: 6 (ref 5.0–8.0)

## 2017-04-15 LAB — COMPREHENSIVE METABOLIC PANEL
ALT: 21 U/L (ref 0–35)
AST: 24 U/L (ref 0–37)
Albumin: 3.9 g/dL (ref 3.5–5.2)
Alkaline Phosphatase: 103 U/L (ref 39–117)
BUN: 11 mg/dL (ref 6–23)
CO2: 29 mEq/L (ref 19–32)
Calcium: 9.8 mg/dL (ref 8.4–10.5)
Chloride: 104 mEq/L (ref 96–112)
Creatinine, Ser: 0.94 mg/dL (ref 0.40–1.20)
GFR: 78.49 mL/min (ref >60.00)
Glucose, Bld: 69 mg/dL — ABNORMAL LOW (ref 70–99)
Potassium: 3.8 mEq/L (ref 3.5–5.1)
Sodium: 140 mEq/L (ref 135–145)
Total Bilirubin: 0.4 mg/dL (ref 0.2–1.2)
Total Protein: 7.8 g/dL (ref 6.0–8.3)

## 2017-04-15 LAB — CBC WITH DIFFERENTIAL/PLATELET
Basophils Absolute: 0 10*3/uL (ref 0.0–0.1)
Basophils Relative: 0.5 % (ref 0.0–3.0)
Eosinophils Absolute: 0.2 10*3/uL (ref 0.0–0.7)
Eosinophils Relative: 2.4 % (ref 0.0–5.0)
HCT: 34.6 % — ABNORMAL LOW (ref 36.0–46.0)
Hemoglobin: 10.7 g/dL — ABNORMAL LOW (ref 12.0–15.0)
Lymphocytes Relative: 35.4 % (ref 12.0–46.0)
Lymphs Abs: 2.3 10*3/uL (ref 0.7–4.0)
MCHC: 30.9 g/dL (ref 30.0–36.0)
MCV: 76.2 fl — ABNORMAL LOW (ref 78.0–100.0)
Monocytes Absolute: 0.6 10*3/uL (ref 0.1–1.0)
Monocytes Relative: 9 % (ref 3.0–12.0)
Neutro Abs: 3.4 10*3/uL (ref 1.4–7.7)
Neutrophils Relative %: 52.7 % (ref 43.0–77.0)
Platelets: 354 10*3/uL (ref 150.0–400.0)
RBC: 4.54 Mil/uL (ref 3.87–5.11)
RDW: 17.2 % — ABNORMAL HIGH (ref 11.5–15.5)
WBC: 6.5 10*3/uL (ref 4.0–10.5)

## 2017-04-15 LAB — HEMOCCULT GUIAC POC 1CARD (OFFICE): Fecal Occult Blood, POC: POSITIVE — AB

## 2017-04-15 LAB — HEMOGLOBIN A1C: Hgb A1c MFr Bld: 6.1 % (ref 4.6–6.5)

## 2017-04-15 LAB — TSH: TSH: 1.37 u[IU]/mL (ref 0.35–4.50)

## 2017-04-15 MED ORDER — AMLODIPINE BESYLATE 5 MG PO TABS: 5.0000 mg | ORAL_TABLET | Freq: Every day | ORAL | 3 refills | Status: DC

## 2017-04-15 MED ORDER — MONTELUKAST SODIUM 10 MG PO TABS: 10.0000 mg | ORAL_TABLET | Freq: Every day | ORAL | 11 refills | Status: AC

## 2017-04-15 MED ORDER — STARCH 51 % RE SUPP: 1.0000 | RECTAL | 0 refills | Status: DC | PRN

## 2017-04-15 MED ORDER — HYDROXYZINE HCL 25 MG PO TABS
25.0000 mg | ORAL_TABLET | Freq: Three times a day (TID) | ORAL | 2 refills | Status: DC | PRN
Start: 2017-04-15 — End: 2018-11-03

## 2017-04-15 NOTE — Patient Instructions (Signed)
Preventive Care 40-64 Years, Female Preventive care refers to lifestyle choices and visits with your health care provider that can promote health and wellness. What does preventive care include?  A yearly physical exam. This is also called an annual well check.  Dental exams once or twice a year.  Routine eye exams. Ask your health care provider how often you should have your eyes checked.  Personal lifestyle choices, including: ? Daily care of your teeth and gums. ? Regular physical activity. ? Eating a healthy diet. ? Avoiding tobacco and drug use. ? Limiting alcohol use. ? Practicing safe sex. ? Taking low-dose aspirin daily starting at age 58. ? Taking vitamin and mineral supplements as recommended by your health care provider. What happens during an annual well check? The services and screenings done by your health care provider during your annual well check will depend on your age, overall health, lifestyle risk factors, and family history of disease. Counseling Your health care provider may ask you questions about your:  Alcohol use.  Tobacco use.  Drug use.  Emotional well-being.  Home and relationship well-being.  Sexual activity.  Eating habits.  Work and work Statistician.  Method of birth control.  Menstrual cycle.  Pregnancy history.  Screening You may have the following tests or measurements:  Height, weight, and BMI.  Blood pressure.  Lipid and cholesterol levels. These may be checked every 5 years, or more frequently if you are over 81 years old.  Skin check.  Lung cancer screening. You may have this screening every year starting at age 78 if you have a 30-pack-year history of smoking and currently smoke or have quit within the past 15 years.  Fecal occult blood test (FOBT) of the stool. You may have this test every year starting at age 65.  Flexible sigmoidoscopy or colonoscopy. You may have a sigmoidoscopy every 5 years or a colonoscopy  every 10 years starting at age 30.  Hepatitis C blood test.  Hepatitis B blood test.  Sexually transmitted disease (STD) testing.  Diabetes screening. This is done by checking your blood sugar (glucose) after you have not eaten for a while (fasting). You may have this done every 1-3 years.  Mammogram. This may be done every 1-2 years. Talk to your health care provider about when you should start having regular mammograms. This may depend on whether you have a family history of breast cancer.  BRCA-related cancer screening. This may be done if you have a family history of breast, ovarian, tubal, or peritoneal cancers.  Pelvic exam and Pap test. This may be done every 3 years starting at age 80. Starting at age 36, this may be done every 5 years if you have a Pap test in combination with an HPV test.  Bone density scan. This is done to screen for osteoporosis. You may have this scan if you are at high risk for osteoporosis.  Discuss your test results, treatment options, and if necessary, the need for more tests with your health care provider. Vaccines Your health care provider may recommend certain vaccines, such as:  Influenza vaccine. This is recommended every year.  Tetanus, diphtheria, and acellular pertussis (Tdap, Td) vaccine. You may need a Td booster every 10 years.  Varicella vaccine. You may need this if you have not been vaccinated.  Zoster vaccine. You may need this after age 5.  Measles, mumps, and rubella (MMR) vaccine. You may need at least one dose of MMR if you were born in  1959 or later. You may also need a second dose.  Pneumococcal 13-valent conjugate (PCV13) vaccine. You may need this if you have certain conditions and were not previously vaccinated.  Pneumococcal polysaccharide (PPSV23) vaccine. You may need one or two doses if you smoke cigarettes or if you have certain conditions.  Meningococcal vaccine. You may need this if you have certain  conditions.  Hepatitis A vaccine. You may need this if you have certain conditions or if you travel or work in places where you may be exposed to hepatitis A.  Hepatitis B vaccine. You may need this if you have certain conditions or if you travel or work in places where you may be exposed to hepatitis B.  Haemophilus influenzae type b (Hib) vaccine. You may need this if you have certain conditions.  Talk to your health care provider about which screenings and vaccines you need and how often you need them. This information is not intended to replace advice given to you by your health care provider. Make sure you discuss any questions you have with your health care provider. Document Released: 07/25/2015 Document Revised: 03/17/2016 Document Reviewed: 04/29/2015 Elsevier Interactive Patient Education  2017 [employer].

## 2017-04-15 NOTE — Progress Notes (Signed)
Subjective:     Carla Jensen is a 56 y.o. female and is here for a comprehensive physical exam. The patient reports no problems. HYPERTENSION   Blood pressure range-not checking   Chest pain- no      Dyspnea- no Lightheadedness- no   Edema- no  Other side effects - no   Medication compliance: good Low salt diet- yes    DIABETES    Blood Sugar ranges-per endo  Polyuria- no New Visual problems- no  Hypoglycemic symptoms- no  Other side effects-no Medication compliance - good Last eye exam- due Foot exam- today   HYPERLIPIDEMIA  Medication compliance- yes RUQ pain- no  Muscle aches- no Other side effects-no     Social History   Social History  . Marital status: Married    Spouse name: N/A  . Number of children: N/A  . Years of education: N/A   Occupational History  .  [employer]    retired Pharmacist, hospital    Social History Main Topics  . Smoking status: Never Smoker  . Smokeless tobacco: Never Used  . Alcohol use No  . Drug use: No  . Sexual activity: Yes    Partners: Male   Other Topics Concern  . Not on file   Social History Narrative   Exercise--no--- because of herniated disc   Health Maintenance  Topic Date Due  . OPHTHALMOLOGY EXAM  03/29/2014  . MAMMOGRAM  01/12/2015  . FOOT EXAM  11/13/2015  . PAP SMEAR  12/12/2015  . HEMOGLOBIN A1C  07/10/2016  . INFLUENZA VACCINE  02/09/2017  . TETANUS/TDAP  12/11/2017  . PNEUMOCOCCAL POLYSACCHARIDE VACCINE (2) 10/20/2018  . COLONOSCOPY  02/09/2023  . Hepatitis C Screening  Completed  . HIV Screening  Completed    The following portions of the patient's history were reviewed and updated as appropriate:  She  has a past medical history of Allergy; Arthritis; Asthma; Chickenpox; Depression; Diabetes mellitus without complication ([place]); GERD (gastroesophageal reflux disease); Hypertension; Stroke [facility]); and Subarachnoid hemorrhage ([place]) (3/11). She  does not have any pertinent problems on file. She   has a past surgical history that includes Knee arthroscopy (Left, 5/06); Tendon repair (Right, 3/07); Achilles tendon repair (Left, 1/08); Achilles tendon repair (Right, 7/09); and Elbow surgery. Her family history includes Arthritis in her mother; Diabetes in her mother; High blood pressure in her father, mother, and sister. She  reports that she has never smoked. She has never used smokeless tobacco. She reports that she does not drink alcohol or use drugs. She has a current medication list which includes the following prescription(s): amlodipine, aspirin, azelastine, one touch ultra system kit, cinnamon, diltiazem, ferrous sulfate, fluticasone, fluticasone, fluticasone, gabapentin, glucose blood, hydrochlorothiazide, hydroxyzine, insulin regular, insulin regular human, insulin syringe-needle u-100, lancets thin, levocetirizine, losartan, multivitamin, naproxen sodium, pantoprazole, natural vegetable laxative, tizanidine, vitamin c, montelukast, and starch. Current Outpatient Prescriptions on File Prior to Visit  Medication Sig Dispense Refill  . aspirin 81 MG tablet Take 81 mg by mouth daily.    Marland Kitchen azelastine (ASTELIN) 0.1 % nasal spray Place 2 sprays into both nostrils 2 (two) times daily. Use in each nostril as directed 30 mL 3  . Blood Glucose Monitoring Suppl (ONE TOUCH ULTRA SYSTEM KIT) W/DEVICE KIT 1 kit by Does not apply route once. Dx. 250.00 1 each 0  . CINNAMON PO Take 1,000 mg by mouth daily.    Marland Kitchen diltiazem (CARDIZEM CD) 120 MG 24 hr capsule Take 1 capsule (120 mg total)  by mouth daily. 90 capsule 0  . ferrous sulfate 325 (65 FE) MG tablet Take 650 mg by mouth daily with breakfast.     . fluticasone (FLONASE) 50 MCG/ACT nasal spray Place 2 sprays into both nostrils daily. 16 g 5  . fluticasone (FLONASE) 50 MCG/ACT nasal spray Place 2 sprays into both nostrils daily. 16 g 2  . fluticasone (FLOVENT HFA) 110 MCG/ACT inhaler Inhale 2 puffs into the lungs 2 (two) times daily. 1 Inhaler 1  .  gabapentin (NEURONTIN) 300 MG capsule Take 1 capsule (300 mg total) by mouth 2 (two) times daily. 180 capsule 0  . glucose blood test strip Use as instructed. Pt tests sugars once daily. Dx. 250.00 100 each 12  . hydrochlorothiazide (HYDRODIURIL) 25 MG tablet Take 1 tablet (25 mg total) by mouth daily. 30 tablet 11  . insulin regular (NOVOLIN R) 100 units/mL injection 25 u sq in am and 25 u sq at noon, 20 u sq in pm 10 mL 11  . Insulin Regular Human (NOVOLIN R RELION IJ) Inject as directed. 3 times a day (just before each meal), 25-25-15 units, and syringes 3/day    . Insulin Syringe-Needle U-100 (RELION INSULIN SYR 0.5ML/31G) 31G X 5/16" 0.5 ML MISC by Does not apply route.    . Lancets Thin MISC Use as directed. Pt tests sugars once daily. Dx. 250.00 100 each 12  . levocetirizine (XYZAL) 5 MG tablet Take 1 tablet (5 mg total) by mouth every evening. 30 tablet 5  . losartan (COZAAR) 50 MG tablet TAKE 1 TABLET (50 MG TOTAL) BY MOUTH DAILY. 90 tablet 0  . Multiple Vitamin (MULTIVITAMIN) tablet Take 1 tablet by mouth daily.    . naproxen sodium (ANAPROX) 220 MG tablet Take 220 mg by mouth daily as needed. For pain    . pantoprazole (PROTONIX) 40 MG tablet TAKE 1 TABLET (40 MG TOTAL) BY MOUTH DAILY. 90 tablet 0  . Senna (NATURAL VEGETABLE LAXATIVE) 65-325 MG TABS Take 1 tablet by mouth 2 (two) times daily.     Marland Kitchen tiZANidine (ZANAFLEX) 4 MG tablet TAKE 1 TABLET BY MOUTH EVERY 6 (SIX) HOURS AS NEEDED FOR MUSCLE SPASMS. 30 tablet 2  . vitamin C (ASCORBIC ACID) 500 MG tablet Take 1,000 mg by mouth daily.      No current facility-administered medications on file prior to visit.    She has No Known Allergies..  Review of Systems Review of Systems  Constitutional: Negative for activity change, appetite change and fatigue.  HENT: Negative for hearing loss, congestion, tinnitus and ear discharge.  dentist q98mEyes: Negative for visual disturbance (see optho q1y -- vision corrected to 20/20 with glasses).   Respiratory: Negative for cough, chest tightness and shortness of breath.   Cardiovascular: Negative for chest pain, palpitations and leg swelling.  Gastrointestinal: Negative for abdominal pain, diarrhea, constipation and abdominal distention.  Genitourinary: Negative for urgency, frequency, decreased urine volume and difficulty urinating.  Musculoskeletal: Negative for back pain, arthralgias and gait problem.  Skin: Negative for color change, pallor and rash.  Neurological: Negative for dizziness, light-headedness, numbness and headaches.  Hematological: Negative for adenopathy. Does not bruise/bleed easily.  Psychiatric/Behavioral: Negative for suicidal ideas, confusion, sleep disturbance, self-injury, dysphoric mood, decreased concentration and agitation.       Objective:    BP 136/86   Pulse 85   Ht '5\' 4"'$  (1.626 m)   Wt 272 lb 3.2 oz (123.5 kg)   LMP 08/05/2010   SpO2 98%  BMI 46.72 kg/m  General appearance: alert, cooperative, appears stated age and no distress Head: Normocephalic, without obvious abnormality, atraumatic Eyes: conjunctivae/corneas clear. PERRL, EOM's intact. Fundi benign. Ears: normal TM's and external ear canals both ears Nose: Nares normal. Septum midline. Mucosa normal. No drainage or sinus tenderness. Throat: lips, mucosa, and tongue normal; teeth and gums normal Neck: no adenopathy, no carotid bruit, no JVD, supple, symmetrical, trachea midline and thyroid not enlarged, symmetric, no tenderness/mass/nodules Back: symmetric, no curvature. ROM normal. No CVA tenderness. Lungs: clear to auscultation bilaterally Breasts: normal appearance, no masses or tenderness Heart: regular rate and rhythm, S1, S2 normal, no murmur, click, rub or gallop Abdomen: soft, non-tender; bowel sounds normal; no masses,  no organomegaly Pelvic: cervix normal in appearance, external genitalia normal, no adnexal masses or tenderness, no cervical motion tenderness, rectovaginal  septum normal, uterus normal size, shape, and consistency and vagina normal without discharge  Rectal- int hemorrhoids-- heme +black stool  Extremities: extremities normal, atraumatic, no cyanosis or edema Pulses: 2+ and symmetric Skin: Skin color, texture, turgor normal. No rashes or lesions Lymph nodes: Cervical, supraclavicular, and axillary nodes normal. Neurologic: Alert and oriented X 3, normal strength and tone. Normal symmetric reflexes. Normal coordination and gait    Assessment:    Healthy female exam.      Plan:    ghm utd  Check labs See After Visit Summary for Counseling Recommendations   Flu shot given   1. Need for influenza vaccination  - Flu Vaccine QUAD 36+ mos IM  2. Seasonal allergies con't antihistamine and flonase - montelukast (SINGULAIR) 10 MG tablet; Take 1 tablet (10 mg total) by mouth at bedtime.  Dispense: 30 tablet; Refill: 11  3. Essential hypertension Well controlled, no changes to meds. Encouraged heart healthy diet such as the DASH diet and exercise as tolerated.   - amLODipine (NORVASC) 5 MG tablet; Take 1 tablet (5 mg total) by mouth daily.  Dispense: 90 tablet; Refill: 3 - Lipid panel - Hemoglobin A1c - CBC with Differential/Platelet - Comprehensive metabolic panel - TSH - POCT Urinalysis Dipstick (Automated)  4. DM (diabetes mellitus) type II, controlled, with peripheral vascular disorder ([place]) Per endo - Lipid panel - Hemoglobin A1c - CBC with Differential/Platelet - Comprehensive metabolic panel - TSH - POCT Urinalysis Dipstick (Automated)  5. Preventative health care See above  6. Pruritic rash Stable-- per derm - hydrOXYzine (ATARAX/VISTARIL) 25 MG tablet; Take 1 tablet (25 mg total) by mouth every 8 (eight) hours as needed. Dr. Elvera Lennox  Dispense: 30 tablet; Refill: 2  7. Hemorrhoids, unspecified hemorrhoid type Int-- heme + stool - starch (ANUSOL) 51 % suppository; Place 1 suppository rectally as needed for pain.   Dispense: 24 suppository; Refill: 0  8. Urine abnormality  - Urine Culture  9. Hematuria, unspecified type  - Urine Culture  -  10. Blood in stool- POCT Occult Blood Stool Maybe from hemorrhoids but pt had anal lesion previously F/u GI  - Fecal occult blood, imunochemical

## 2017-04-16 LAB — URINE CULTURE
MICRO NUMBER:: 81110235
SPECIMEN QUALITY:: ADEQUATE

## 2017-04-18 ENCOUNTER — Other Ambulatory Visit: Payer: Self-pay | Admitting: Family Medicine

## 2017-04-18 ENCOUNTER — Other Ambulatory Visit: Payer: Medicare Other

## 2017-04-18 DIAGNOSIS — D649 Anemia, unspecified: Secondary | ICD-10-CM

## 2017-04-19 ENCOUNTER — Other Ambulatory Visit (INDEPENDENT_AMBULATORY_CARE_PROVIDER_SITE_OTHER): Payer: Medicare Other

## 2017-04-19 DIAGNOSIS — D649 Anemia, unspecified: Secondary | ICD-10-CM | POA: Diagnosis not present

## 2017-04-19 LAB — CBC WITH DIFFERENTIAL/PLATELET
Basophils Absolute: 0 10*3/uL (ref 0.0–0.1)
Basophils Relative: 0.6 % (ref 0.0–3.0)
Eosinophils Absolute: 0.1 10*3/uL (ref 0.0–0.7)
Eosinophils Relative: 2.2 % (ref 0.0–5.0)
HCT: 34.1 % — ABNORMAL LOW (ref 36.0–46.0)
Hemoglobin: 10.5 g/dL — ABNORMAL LOW (ref 12.0–15.0)
Lymphocytes Relative: 29.6 % (ref 12.0–46.0)
Lymphs Abs: 1.8 10*3/uL (ref 0.7–4.0)
MCHC: 30.7 g/dL (ref 30.0–36.0)
MCV: 76 fl — ABNORMAL LOW (ref 78.0–100.0)
Monocytes Absolute: 0.5 10*3/uL (ref 0.1–1.0)
Monocytes Relative: 7.4 % (ref 3.0–12.0)
Neutro Abs: 3.7 10*3/uL (ref 1.4–7.7)
Neutrophils Relative %: 60.2 % (ref 43.0–77.0)
Platelets: 359 10*3/uL (ref 150.0–400.0)
RBC: 4.49 Mil/uL (ref 3.87–5.11)
RDW: 17.1 % — ABNORMAL HIGH (ref 11.5–15.5)
WBC: 6.2 10*3/uL (ref 4.0–10.5)

## 2017-04-19 LAB — CYTOLOGY - PAP
Diagnosis: NEGATIVE
HPV: NOT DETECTED

## 2017-04-19 LAB — IBC PANEL
Iron: 54 ug/dL (ref 42–145)
Saturation Ratios: 19.5 % — ABNORMAL LOW (ref 20.0–50.0)
Transferrin: 198 mg/dL — ABNORMAL LOW (ref 212.0–360.0)

## 2017-04-19 LAB — FERRITIN: Ferritin: 91.2 ng/mL (ref 10.0–291.0)

## 2017-04-20 ENCOUNTER — Telehealth: Payer: Self-pay | Admitting: *Deleted

## 2017-04-20 NOTE — Telephone Encounter (Signed)
Left voicemail for patient to call back. 

## 2017-04-20 NOTE — Telephone Encounter (Signed)
-----   Message from Jerene Bears, MD sent at 04/19/2017 10:18 PM EDT ----- Kendrick Fries I think given the time interval we should repeat colonoscopy 1st I will try to arrange Thanks JMP  Dottie This lady needs colonoscopy for heme + stools JMP  ----- Message ----- From: Ann Held, DO Sent: 04/15/2017  10:04 AM To: Jerene Bears, MD  Ulice Dash, This pt has a colonoscopy in 2014--- you referred her to surgery but pt states she never went  (she did not recall that she was supposed to go).  She now has heme + stool (but does have hemorrhoids) I looked quickly to see if maybe the referral was cancelled but did nt see anything-- I'm happy to refer to to surgery if you like.   Thanks  [employer]

## 2017-04-25 ENCOUNTER — Telehealth: Payer: Self-pay | Admitting: Internal Medicine

## 2017-04-25 NOTE — Telephone Encounter (Signed)
See 04/20/17 phone note.

## 2017-04-25 NOTE — Telephone Encounter (Signed)
Left voicemail for patient to call back. 

## 2017-04-25 NOTE — Telephone Encounter (Signed)
See 04/25/17 note. Patient left message returning my call. I have left her a voicemail again to call back.

## 2017-04-26 NOTE — Telephone Encounter (Signed)
I have spoken to patient she has been advised of Dr Vena Rua advise. She has scheduled a previsit for Thursday, 04/28/17 at 8:30 am and a colonoscopy on 05/03/17 at 8:30 am.

## 2017-04-29 ENCOUNTER — Ambulatory Visit (AMBULATORY_SURGERY_CENTER): Payer: Self-pay

## 2017-04-29 ENCOUNTER — Telehealth: Payer: Self-pay | Admitting: Internal Medicine

## 2017-04-29 ENCOUNTER — Telehealth: Payer: Self-pay

## 2017-04-29 VITALS — Ht 64.0 in | Wt 275.2 lb

## 2017-04-29 DIAGNOSIS — R195 Other fecal abnormalities: Secondary | ICD-10-CM

## 2017-04-29 MED ORDER — NA SULFATE-K SULFATE-MG SULF 17.5-3.13-1.6 GM/177ML PO SOLN
1.0000 | Freq: Once | ORAL | 0 refills | Status: AC
Start: 2017-04-29 — End: 2017-04-29

## 2017-04-29 NOTE — Telephone Encounter (Signed)
Returned patients call. Suprep was going to cost the patient 100.00 and 37 cents. Patient can not afford to get it.  A sample of Suprep will be left at the 4th floor desk. Patient will pick it up today about 4:00 Pm.   Riki Sheer, LPN

## 2017-04-29 NOTE — Progress Notes (Signed)
Denies allergies to eggs or soy products. Denies complication of anesthesia or sedation. Denies use of weight loss medication. Denies use of O2.   Emmi instructions declined.  

## 2017-05-01 ENCOUNTER — Other Ambulatory Visit: Payer: Self-pay | Admitting: Family Medicine

## 2017-05-01 DIAGNOSIS — Z794 Long term (current) use of insulin: Principal | ICD-10-CM

## 2017-05-01 DIAGNOSIS — E1140 Type 2 diabetes mellitus with diabetic neuropathy, unspecified: Secondary | ICD-10-CM

## 2017-05-03 ENCOUNTER — Ambulatory Visit (AMBULATORY_SURGERY_CENTER): Payer: Medicare Other | Admitting: Internal Medicine

## 2017-05-03 ENCOUNTER — Encounter: Payer: Self-pay | Admitting: Internal Medicine

## 2017-05-03 VITALS — BP 135/60 | HR 81 | Temp 97.3°F | Resp 15 | Ht 64.0 in | Wt 275.0 lb

## 2017-05-03 DIAGNOSIS — D125 Benign neoplasm of sigmoid colon: Secondary | ICD-10-CM

## 2017-05-03 DIAGNOSIS — R195 Other fecal abnormalities: Secondary | ICD-10-CM

## 2017-05-03 MED ORDER — SODIUM CHLORIDE 0.9 % IV SOLN: 500.0000 mL | INTRAVENOUS | Status: DC

## 2017-05-03 NOTE — Patient Instructions (Signed)
YOU HAD AN ENDOSCOPIC PROCEDURE TODAY AT THE [facility]:   Refer to the procedure report that was given to you for any specific questions about what was found during the examination.  If the procedure report does not answer your questions, please call your gastroenterologist to clarify.  If you requested that your care partner not be given the details of your procedure findings, then the procedure report has been included in a sealed envelope for you to review at your convenience later.  YOU SHOULD EXPECT: Some feelings of bloating in the abdomen. Passage of more gas than usual.  Walking can help get rid of the air that was put into your GI tract during the procedure and reduce the bloating. If you had a lower endoscopy (such as a colonoscopy or flexible sigmoidoscopy) you may notice spotting of blood in your stool or on the toilet paper. If you underwent a bowel prep for your procedure, you may not have a normal bowel movement for a few days.  Please Note:  You might notice some irritation and congestion in your nose or some drainage.  This is from the oxygen used during your procedure.  There is no need for concern and it should clear up in a day or so.  SYMPTOMS TO REPORT IMMEDIATELY:   Following lower endoscopy (colonoscopy or flexible sigmoidoscopy):  Excessive amounts of blood in the stool  Significant tenderness or worsening of abdominal pains  Swelling of the abdomen that is new, acute  Fever of 100F or higher  For urgent or emergent issues, a gastroenterologist can be reached at any hour by calling [phone].   DIET:  We do recommend a small meal at first, but then you may proceed to your regular diet.  Drink plenty of fluids but you should avoid alcoholic beverages for 24 hours.  ACTIVITY:  You should plan to take it easy for the rest of today and you should NOT DRIVE or use heavy machinery until tomorrow (because of the sedation medicines used during the test).     FOLLOW UP: Our staff will call the number listed on your records the next business day following your procedure to check on you and address any questions or concerns that you may have regarding the information given to you following your procedure. If we do not reach you, we will leave a message.  However, if you are feeling well and you are not experiencing any problems, there is no need to return our call.  We will assume that you have returned to your regular daily activities without incident.  If any biopsies were taken you will be contacted by phone or by letter within the next 1-3 weeks.  Please call us at [phone] if you have not heard about the biopsies in 3 weeks.    SIGNATURES/CONFIDENTIALITY: You and/or your care partner have signed paperwork which will be entered into your electronic medical record.  These signatures attest to the fact that that the information above on your After Visit Summary has been reviewed and is understood.  Full responsibility of the confidentiality of this discharge information lies with you and/or your care-partner  Polyp, diverticulosis, and hemorrhoid information given.. 

## 2017-05-03 NOTE — Progress Notes (Signed)
Called to room to assist during endoscopic procedure.  Patient ID and intended procedure confirmed with present staff. Received instructions for my participation in the procedure from the performing physician.  

## 2017-05-03 NOTE — Progress Notes (Signed)
Report given to PACU, vss 

## 2017-05-03 NOTE — Op Note (Signed)
[facility] Patient Name: Carla Jensen Procedure Date: 05/03/2017 8:32 AM MRN: [id number] Endoscopist: Jerene Bears , MD Age: 56 Referring MD:  Date of Birth: February 14, 1961 Gender: Female Account #: [id number] Procedure:                Colonoscopy Indications:              Heme positive stool Medicines:                Monitored Anesthesia Care Procedure:                Pre-Anesthesia Assessment:                           - Prior to the procedure, a History and Physical                            was performed, and patient medications and                            allergies were reviewed. The patient's tolerance of                            previous anesthesia was also reviewed. The risks                            and benefits of the procedure and the sedation                            options and risks were discussed with the patient.                            All questions were answered, and informed consent                            was obtained. Prior Anticoagulants: The patient has                            taken no previous anticoagulant or antiplatelet                            agents. ASA Grade Assessment: III - A patient with                            severe systemic disease. After reviewing the risks                            and benefits, the patient was deemed in                            satisfactory condition to undergo the procedure.                           After obtaining informed consent, the colonoscope  was passed under direct vision. Throughout the                            procedure, the patient's blood pressure, pulse, and                            oxygen saturations were monitored continuously. The                            Colonoscope was introduced through the anus and                            advanced to the the cecum, identified by                            appendiceal orifice and ileocecal valve. The                        colonoscopy was performed without difficulty. The                            patient tolerated the procedure well. The quality                            of the bowel preparation was good. The ileocecal                            valve, appendiceal orifice, and rectum were                            photographed. Scope In: 8:41:56 AM Scope Out: 8:52:50 AM Scope Withdrawal Time: 0 hours 8 minutes 47 seconds  Total Procedure Duration: 0 hours 10 minutes 54 seconds  Findings:                 The digital rectal exam was normal.                           A 4 mm polyp was found in the sigmoid colon. The                            polyp was sessile. The polyp was removed with a                            cold snare. Resection and retrieval were complete.                           A few small-mouthed diverticula were found in the                            sigmoid colon.                           Internal hemorrhoids and hypertrophied anal  papillae (same "lesion" that was seen in 2014 and                            is unchanged) were found during retroflexion. The                            hemorrhoids were small.                           The exam was otherwise without abnormality. Complications:            No immediate complications. Estimated Blood Loss:     Estimated blood loss was minimal. Impression:               - One 4 mm polyp in the sigmoid colon, removed with                            a cold snare. Resected and retrieved.                           - Very mild diverticulosis in the sigmoid colon.                           - Small internal hemorrhoids and hypertrophied anal                            papillae (benign).                           - The examination was otherwise normal. Recommendation:           - Patient has a contact number available for                            emergencies. The signs and symptoms of potential                             delayed complications were discussed with the                            patient. Return to normal activities tomorrow.                            Written discharge instructions were provided to the                            patient.                           - Resume previous diet.                           - Continue present medications.                           - Await pathology results.                           -  Repeat colonoscopy is recommended. The                            colonoscopy date will be determined after pathology                            results from today's exam become available for                            review. Jerene Bears, MD 05/03/2017 8:57:54 AM This report has been signed electronically.

## 2017-05-04 ENCOUNTER — Telehealth: Payer: Self-pay

## 2017-05-04 NOTE — Telephone Encounter (Signed)
  Follow up Call-  Call back number 05/03/2017  Post procedure Call Back phone  # [phone]  Permission to leave phone message Yes  Some recent data might be hidden     Patient questions:  Do you have a fever, pain , or abdominal swelling? No. Pain Score  0 *  Have you tolerated food without any problems? Yes.    Have you been able to return to your normal activities? Yes.    Do you have any questions about your discharge instructions: Diet   No. Medications  No. Follow up visit  No.  Do you have questions or concerns about your Care? No.  Actions: * If pain score is 4 or above: No action needed, pain <4.

## 2017-05-05 ENCOUNTER — Encounter: Payer: Self-pay | Admitting: Internal Medicine

## 2017-05-10 ENCOUNTER — Other Ambulatory Visit: Payer: Self-pay | Admitting: Family Medicine

## 2017-05-10 DIAGNOSIS — K219 Gastro-esophageal reflux disease without esophagitis: Secondary | ICD-10-CM

## 2017-05-10 DIAGNOSIS — I10 Essential (primary) hypertension: Secondary | ICD-10-CM

## 2017-05-11 NOTE — Progress Notes (Signed)
Subjective:   Carla Jensen is a 56 y.o. female who presents for Medicare Annual (Subsequent) preventive examination.  She describes her health as fair. Pt feels like her back pain keeps health from being good.  Review of Systems:  No ROS.  Medicare Wellness Visit. Additional risk factors are reflected in the social history.  Cardiac Risk Factors include: advanced age (>61mn, >>20women);hypertension;diabetes mellitus;sedentary lifestyle;obesity (BMI >30kg/m2) Sleep patterns: wakes frequently due to back discomfort. Sleeps about 8 hrs but restless. Muscle relaxer helps her to sleep. Melatonin discussed.  Female:   Pap-  04/15/17-normal     Mammo- ORDERED TODAY.      Dexa scan-  ORDERED TODAY.    CCS- 05/03/17- recall 5 yr      Objective:     Vitals: BP (!) 140/58   Pulse 92   Ht '5\' 4"'$  (1.626 m)   Wt 272 lb (123.4 kg)   LMP 08/05/2010   SpO2 99%   BMI 46.69 kg/m   Body mass index is 46.69 kg/m.   Tobacco Social History   Tobacco Use  Smoking Status Never Smoker  Smokeless Tobacco Never Used     Counseling given: Not Answered   Past Medical History:  Diagnosis Date  . Allergy   . Anemia   . Arthritis   . Asthma   . Chickenpox   . Depression   . Diabetes mellitus without complication ([facility]   . GERD (gastroesophageal reflux disease)   . Hypertension   . Stroke ([facility]   . 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 ACHILLES TENDON REPAIR Left 1/08   Dr.Norris  . ACHILLES TENDON REPAIR Right 7/09   Dr.Norris  . ELBOW SURGERY    . KNEE ARTHROSCOPY Left 5/06  . TENDON REPAIR Right 3/07   Elbow   Family History  Problem Relation Age of Onset  . High blood pressure Father   . Arthritis Mother   . High blood pressure Mother   . Diabetes Mother   . High blood pressure Sister   . Colon cancer Neg Hx   . Esophageal cancer Neg Hx   . Pancreatic cancer Neg Hx     . Rectal cancer Neg Hx   . Stomach cancer Neg Hx    Social History   Substance and Sexual Activity  Sexual Activity Yes  . Partners: Male    Outpatient Encounter Medications as of 05/18/2017  Medication Sig  . amLODipine (NORVASC) 5 MG tablet Take 1 tablet (5 mg total) by mouth daily.  .Marland Kitchenaspirin 81 MG tablet Take 81 mg by mouth daily.  . Blood Glucose Monitoring Suppl (ONE TOUCH ULTRA SYSTEM KIT) W/DEVICE KIT 1 kit by Does not apply route once. Dx. 250.00  . CINNAMON PO Take 1,000 mg by mouth daily.  . ferrous sulfate 325 (65 FE) MG tablet Take 650 mg by mouth daily with breakfast.   . fluticasone (FLONASE) 50 MCG/ACT nasal spray Place 2 sprays into both nostrils daily.  .Marland Kitchengabapentin (NEURONTIN) 300 MG capsule TAKE 1 CAPSULE BY MOUTH  TWICE DAILY  . glucose blood test strip Use as instructed. Pt tests sugars once daily. Dx. 250.00  . hydrOXYzine (ATARAX/VISTARIL) 25 MG tablet Take 1 tablet (25 mg total) by mouth every 8 (eight) hours as needed. Dr. WElvera Lennox . Insulin Regular Human (NOVOLIN R RELION IJ) Inject as directed. 3 times a day (just before each meal), 25-25-15  units, and syringes 3/day  . Insulin Syringe-Needle U-100 (RELION INSULIN SYR 0.5ML/31G) 31G X 5/16" 0.5 ML MISC by Does not apply route.  . Lancets Thin MISC Use as directed. Pt tests sugars once daily. Dx. 250.00  . losartan (COZAAR) 50 MG tablet TAKE 1 TABLET BY MOUTH  DAILY  . montelukast (SINGULAIR) 10 MG tablet Take 1 tablet (10 mg total) by mouth at bedtime.  . Multiple Vitamin (MULTIVITAMIN) tablet Take 1 tablet by mouth daily.  . naproxen sodium (ANAPROX) 220 MG tablet Take 220 mg by mouth daily as needed. For pain  . pantoprazole (PROTONIX) 40 MG tablet TAKE 1 TABLET BY MOUTH  DAILY  . Senna (NATURAL VEGETABLE LAXATIVE) 65-325 MG TABS Take 1 tablet by mouth 2 (two) times daily.   Marland Kitchen tiZANidine (ZANAFLEX) 4 MG tablet TAKE 1 TABLET BY MOUTH EVERY 6 (SIX) HOURS AS NEEDED FOR MUSCLE SPASMS.  . vitamin C (ASCORBIC  ACID) 500 MG tablet Take 1,000 mg by mouth daily.   Marland Kitchen azelastine (ASTELIN) 0.1 % nasal spray Place 2 sprays into both nostrils 2 (two) times daily. Use in each nostril as directed (Patient not taking: Reported on 05/18/2017)  . fluticasone (FLOVENT HFA) 110 MCG/ACT inhaler Inhale 2 puffs into the lungs 2 (two) times daily. (Patient not taking: Reported on 05/18/2017)  . hydrochlorothiazide (HYDRODIURIL) 25 MG tablet Take 1 tablet (25 mg total) by mouth daily. (Patient not taking: Reported on 05/18/2017)  . [DISCONTINUED] fluticasone (FLONASE) 50 MCG/ACT nasal spray Place 2 sprays into both nostrils daily.  . [DISCONTINUED] insulin regular (NOVOLIN R) 100 units/mL injection 25 u sq in am and 25 u sq at noon, 20 u sq in pm (Patient not taking: Reported on 05/18/2017)  . [DISCONTINUED] losartan (COZAAR) 50 MG tablet TAKE 1 TABLET (50 MG TOTAL) BY MOUTH DAILY.  . [DISCONTINUED] pantoprazole (PROTONIX) 40 MG tablet TAKE 1 TABLET (40 MG TOTAL) BY MOUTH DAILY.   No facility-administered encounter medications on file as of 05/18/2017.     Activities of Daily Living In your present state of health, do you have any difficulty performing the following activities: 05/18/2017  Hearing? N  Vision? N  Comment wears reading glasses. Pt states she will schcedule diabetic eye exam. Reports it has been 2 yrs.  Difficulty concentrating or making decisions? Y  Comment Pt reports she has noticed changes since stroke in 2011.  Walking or climbing stairs? Y  Comment back pain  Dressing or bathing? N  Doing errands, shopping? N  Preparing Food and eating ? N  Using the Toilet? N  In the past six months, have you accidently leaked urine? N  Do you have problems with loss of bowel control? N  Managing your Medications? N  Managing your Finances? N  Housekeeping or managing your Housekeeping? N  Some recent data might be hidden    Patient Care Team: Carollee Herter, Alferd Apa, DO as PCP - General (Family  Medicine) Renato Shin, MD as Consulting Physician (Endocrinology) Pyrtle, Lajuan Lines, MD as Consulting Physician (Gastroenterology)    Assessment:    Physical assessment deferred to PCP.  Exercise Activities and Dietary recommendations Current Exercise Habits: The patient does not participate in regular exercise at present, Exercise limited by: None identified  Diet (meal preparation, eat out, water intake, caffeinated beverages, dairy products, fruits and vegetables): in general, an "unhealthy" diet Breakfast: Sausage egg and chs biscuit and coffee Lunch: lasagna snack and pb&J Dinner: lasagna, green beans and water. Hot tamales.  Goals    .  Lose 30 lbs.   (pt-stated)     By eating healthy-Watching sodium and sugar intake.   Stop eating when full.  Keeping a food diary. Water aerobics.       Fall Risk Fall Risk  05/18/2017 04/24/2015  Falls in the past year? No No   Depression Screen PHQ 2/9 Scores 05/18/2017 04/24/2015 12/11/2012  PHQ - 2 Score 0 0 0     Cognitive Function MMSE - Mini Mental State Exam 05/18/2017 04/24/2015  Orientation to time 5 5  Orientation to Place 5 5  Registration 3 3  Attention/ Calculation 5 5  Recall 3 3  Language- name 2 objects 2 2  Language- repeat 1 1  Language- follow 3 step command 3 3  Language- read & follow direction 1 1  Write a sentence 1 1  Copy design 1 1  Total score 30 30        Immunization History  Administered Date(s) Administered  . Influenza,inj,Quad PF,6+ Mos 04/15/2017  . Pneumococcal Polysaccharide-23 10/19/2013  . Tdap 12/12/2007   Screening Tests Health Maintenance  Topic Date Due  . OPHTHALMOLOGY EXAM  03/29/2014  . MAMMOGRAM  01/12/2015  . FOOT EXAM  11/13/2015  . HEMOGLOBIN A1C  10/14/2017  . TETANUS/TDAP  12/11/2017  . PNEUMOCOCCAL POLYSACCHARIDE VACCINE (2) 10/20/2018  . PAP SMEAR  04/15/2020  . COLONOSCOPY  05/03/2022  . INFLUENZA VACCINE  Completed  . Hepatitis C Screening  Completed  . HIV  Screening  Completed      Plan:   Follow up with Dr.Lowne as scheduled 10/17/17.  Eat heart healthy diet (full of fruits, vegetables, whole grains, lean protein, water--limit salt, fat, and sugar intake) and increase physical activity as tolerated.  Begin keeping a food diary and reading labels.  Continue to eat heart healthy diet (full of fruits, vegetables, whole grains, lean protein, water--limit salt, fat, and sugar intake) and increase physical activity as tolerated.  I have ordered your mammogram and bone density scan. They will call you to schedule.    I have personally reviewed and noted the following in the patient's chart:   . Medical and social history . Use of alcohol, tobacco or illicit drugs  . Current medications and supplements . Functional ability and status . Nutritional status . Physical activity . Advanced directives . List of other physicians . Hospitalizations, surgeries, and ER visits in previous 12 months . Vitals . Screenings to include cognitive, depression, and falls . Referrals and appointments  In addition, I have reviewed and discussed with patient certain preventive protocols, quality metrics, and best practice recommendations. A written personalized care plan for preventive services as well as general preventive health recommendations were provided to patient.     Shela Nevin, RN  05/19/2017   I have reviewed the above MWE note by Ms. Vevelyn Royals and agree with her documentation Denny Peon MD

## 2017-05-18 ENCOUNTER — Ambulatory Visit (INDEPENDENT_AMBULATORY_CARE_PROVIDER_SITE_OTHER): Payer: Medicare Other | Admitting: *Deleted

## 2017-05-18 ENCOUNTER — Encounter: Payer: Self-pay | Admitting: *Deleted

## 2017-05-18 VITALS — BP 140/58 | HR 92 | Ht 64.0 in | Wt 272.0 lb

## 2017-05-18 DIAGNOSIS — Z0000 Encounter for general adult medical examination without abnormal findings: Secondary | ICD-10-CM

## 2017-05-18 DIAGNOSIS — Z780 Asymptomatic menopausal state: Secondary | ICD-10-CM

## 2017-05-18 DIAGNOSIS — Z1239 Encounter for other screening for malignant neoplasm of breast: Secondary | ICD-10-CM

## 2017-05-18 NOTE — Patient Instructions (Addendum)
Follow up with Dr.Lowne as scheduled 10/17/17.  Eat heart healthy diet (full of fruits, vegetables, whole grains, lean protein, water--limit salt, fat, and sugar intake) and increase physical activity as tolerated.  Begin keeping a food diary and reading labels.  Continue to eat heart healthy diet (full of fruits, vegetables, whole grains, lean protein, water--limit salt, fat, and sugar intake) and increase physical activity as tolerated.  I have ordered your mammogram and bone density scan. They will call you to schedule.  Carla Jensen , Thank you for taking time to come for your Medicare Wellness Visit. I appreciate your ongoing commitment to your health goals. Please review the following plan we discussed and let me know if I can assist you in the future.   These are the goals we discussed: Goals    . Lose 30 lbs.   (pt-stated)     By eating healthy-Watching sodium and sugar intake.   Stop eating when full.  Keeping a food diary. Water aerobics.        This is a list of the screening recommended for you and due dates:  Health Maintenance  Topic Date Due  . Eye exam for diabetics  03/29/2014  . Mammogram  01/12/2015  . Complete foot exam   11/13/2015  . Hemoglobin A1C  10/14/2017  . Tetanus Vaccine  12/11/2017  . Pneumococcal vaccine (2) 10/20/2018  . Pap Smear  04/15/2020  . Colon Cancer Screening  05/03/2022  . Flu Shot  Completed  .  Hepatitis C: One time screening is recommended by Center for Disease Control  (CDC) for  adults born from [birth year] through 1967.   Completed  . HIV Screening  Completed    Health Maintenance for Postmenopausal Women Menopause is a normal process in which your reproductive ability comes to an end. This process happens gradually over a span of months to years, usually between the ages of 57 and 34. Menopause is complete when you have missed 12 consecutive menstrual periods. It is important to talk with your health care provider about some of the most  common conditions that affect postmenopausal women, such as heart disease, cancer, and bone loss (osteoporosis). Adopting a healthy lifestyle and getting preventive care can help to promote your health and wellness. Those actions can also lower your chances of developing some of these common conditions. What should I know about menopause? During menopause, you may experience a number of symptoms, such as:  Moderate-to-severe hot flashes.  Night sweats.  Decrease in sex drive.  Mood swings.  Headaches.  Tiredness.  Irritability.  Memory problems.  Insomnia.  Choosing to treat or not to treat menopausal changes is an individual decision that you make with your health care provider. What should I know about hormone replacement therapy and supplements? Hormone therapy products are effective for treating symptoms that are associated with menopause, such as hot flashes and night sweats. Hormone replacement carries certain risks, especially as you become older. If you are thinking about using estrogen or estrogen with progestin treatments, discuss the benefits and risks with your health care provider. What should I know about heart disease and stroke? Heart disease, heart attack, and stroke become more likely as you age. This may be due, in part, to the hormonal changes that your body experiences during menopause. These can affect how your body processes dietary fats, triglycerides, and cholesterol. Heart attack and stroke are both medical emergencies. There are many things that you can do to help prevent heart disease  and stroke:  Have your blood pressure checked at least every 1-2 years. High blood pressure causes heart disease and increases the risk of stroke.  If you are 104-38 years old, ask your health care provider if you should take aspirin to prevent a heart attack or a stroke.  Do not use any tobacco products, including cigarettes, chewing tobacco, or electronic cigarettes. If you  need help quitting, ask your health care provider.  It is important to eat a healthy diet and maintain a healthy weight. ? Be sure to include plenty of vegetables, fruits, low-fat dairy products, and lean protein. ? Avoid eating foods that are high in solid fats, added sugars, or salt (sodium).  Get regular exercise. This is one of the most important things that you can do for your health. ? Try to exercise for at least 150 minutes each week. The type of exercise that you do should increase your heart rate and make you sweat. This is known as moderate-intensity exercise. ? Try to do strengthening exercises at least twice each week. Do these in addition to the moderate-intensity exercise.  Know your numbers.Ask your health care provider to check your cholesterol and your blood glucose. Continue to have your blood tested as directed by your health care provider.  What should I know about cancer screening? There are several types of cancer. Take the following steps to reduce your risk and to catch any cancer development as early as possible. Breast Cancer  Practice breast self-awareness. ? This means understanding how your breasts normally appear and feel. ? It also means doing regular breast self-exams. Let your health care provider know about any changes, no matter how small.  If you are 110 or older, have a clinician do a breast exam (clinical breast exam or CBE) every year. Depending on your age, family history, and medical history, it may be recommended that you also have a yearly breast X-ray (mammogram).  If you have a family history of breast cancer, talk with your health care provider about genetic screening.  If you are at high risk for breast cancer, talk with your health care provider about having an MRI and a mammogram every year.  Breast cancer (BRCA) gene test is recommended for women who have family members with BRCA-related cancers. Results of the assessment will determine the  need for genetic counseling and BRCA1 and for BRCA2 testing. BRCA-related cancers include these types: ? Breast. This occurs in males or females. ? Ovarian. ? Tubal. This may also be called fallopian tube cancer. ? Cancer of the abdominal or pelvic lining (peritoneal cancer). ? Prostate. ? Pancreatic.  Cervical, Uterine, and Ovarian Cancer Your health care provider may recommend that you be screened regularly for cancer of the pelvic organs. These include your ovaries, uterus, and vagina. This screening involves a pelvic exam, which includes checking for microscopic changes to the surface of your cervix (Pap test).  For women ages 21-65, health care providers may recommend a pelvic exam and a Pap test every three years. For women ages 35-65, they may recommend the Pap test and pelvic exam, combined with testing for human papilloma virus (HPV), every five years. Some types of HPV increase your risk of cervical cancer. Testing for HPV may also be done on women of any age who have unclear Pap test results.  Other health care providers may not recommend any screening for nonpregnant women who are considered low risk for pelvic cancer and have no symptoms. Ask your health  care provider if a screening pelvic exam is right for you.  If you have had past treatment for cervical cancer or a condition that could lead to cancer, you need Pap tests and screening for cancer for at least 20 years after your treatment. If Pap tests have been discontinued for you, your risk factors (such as having a new sexual partner) need to be reassessed to determine if you should start having screenings again. Some women have medical problems that increase the chance of getting cervical cancer. In these cases, your health care provider may recommend that you have screening and Pap tests more often.  If you have a family history of uterine cancer or ovarian cancer, talk with your health care provider about genetic  screening.  If you have vaginal bleeding after reaching menopause, tell your health care provider.  There are currently no reliable tests available to screen for ovarian cancer.  Lung Cancer Lung cancer screening is recommended for adults 12-30 years old who are at high risk for lung cancer because of a history of smoking. A yearly low-dose CT scan of the lungs is recommended if you:  Currently smoke.  Have a history of at least 30 pack-years of smoking and you currently smoke or have quit within the past 15 years. A pack-year is smoking an average of one pack of cigarettes per day for one year.  Yearly screening should:  Continue until it has been 15 years since you quit.  Stop if you develop a health problem that would prevent you from having lung cancer treatment.  Colorectal Cancer  This type of cancer can be detected and can often be prevented.  Routine colorectal cancer screening usually begins at age 20 and continues through age 24.  If you have risk factors for colon cancer, your health care provider may recommend that you be screened at an earlier age.  If you have a family history of colorectal cancer, talk with your health care provider about genetic screening.  Your health care provider may also recommend using home test kits to check for hidden blood in your stool.  A small camera at the end of a tube can be used to examine your colon directly (sigmoidoscopy or colonoscopy). This is done to check for the earliest forms of colorectal cancer.  Direct examination of the colon should be repeated every 5-10 years until age 33. However, if early forms of precancerous polyps or small growths are found or if you have a family history or genetic risk for colorectal cancer, you may need to be screened more often.  Skin Cancer  Check your skin from head to toe regularly.  Monitor any moles. Be sure to tell your health care provider: ? About any new moles or changes in moles,  especially if there is a change in a mole's shape or color. ? If you have a mole that is larger than the size of a pencil eraser.  If any of your family members has a history of skin cancer, especially at a young age, talk with your health care provider about genetic screening.  Always use sunscreen. Apply sunscreen liberally and repeatedly throughout the day.  Whenever you are outside, protect yourself by wearing long sleeves, pants, a wide-brimmed hat, and sunglasses.  What should I know about osteoporosis? Osteoporosis is a condition in which bone destruction happens more quickly than new bone creation. After menopause, you may be at an increased risk for osteoporosis. To help prevent osteoporosis or  the bone fractures that can happen because of osteoporosis, the following is recommended:  If you are 55-77 years old, get at least 1,000 mg of calcium and at least 600 mg of vitamin D per day.  If you are older than age 1 but younger than age 44, get at least 1,200 mg of calcium and at least 600 mg of vitamin D per day.  If you are older than age 56, get at least 1,200 mg of calcium and at least 800 mg of vitamin D per day.  Smoking and excessive alcohol intake increase the risk of osteoporosis. Eat foods that are rich in calcium and vitamin D, and do weight-bearing exercises several times each week as directed by your health care provider. What should I know about how menopause affects my mental health? Depression may occur at any age, but it is more common as you become older. Common symptoms of depression include:  Low or sad mood.  Changes in sleep patterns.  Changes in appetite or eating patterns.  Feeling an overall lack of motivation or enjoyment of activities that you previously enjoyed.  Frequent crying spells.  Talk with your health care provider if you think that you are experiencing depression. What should I know about immunizations? It is important that you get and  maintain your immunizations. These include:  Tetanus, diphtheria, and pertussis (Tdap) booster vaccine.  Influenza every year before the flu season begins.  Pneumonia vaccine.  Shingles vaccine.  Your health care provider may also recommend other immunizations. This information is not intended to replace advice given to you by your health care provider. Make sure you discuss any questions you have with your health care provider. Document Released: 08/20/2005 Document Revised: 01/16/2016 Document Reviewed: 04/01/2015 Elsevier Interactive Patient Education  2018 [employer].

## 2017-05-20 NOTE — Progress Notes (Signed)
reviewed

## 2017-06-30 ENCOUNTER — Other Ambulatory Visit: Payer: Self-pay | Admitting: *Deleted

## 2017-06-30 DIAGNOSIS — I10 Essential (primary) hypertension: Secondary | ICD-10-CM

## 2017-06-30 MED ORDER — AMLODIPINE BESYLATE 5 MG PO TABS: 5.0000 mg | ORAL_TABLET | Freq: Every day | ORAL | 1 refills | Status: DC

## 2017-08-15 ENCOUNTER — Other Ambulatory Visit: Payer: Self-pay | Admitting: Family Medicine

## 2017-08-15 DIAGNOSIS — I10 Essential (primary) hypertension: Secondary | ICD-10-CM

## 2017-08-15 DIAGNOSIS — K219 Gastro-esophageal reflux disease without esophagitis: Secondary | ICD-10-CM

## 2017-08-17 ENCOUNTER — Telehealth: Payer: Self-pay | Admitting: *Deleted

## 2017-08-17 NOTE — Telephone Encounter (Signed)
Called patient for her to call to update address.  Left message on machine.  Will not send electronically.

## 2017-08-18 ENCOUNTER — Other Ambulatory Visit: Payer: Self-pay | Admitting: *Deleted

## 2017-08-18 DIAGNOSIS — I10 Essential (primary) hypertension: Secondary | ICD-10-CM

## 2017-08-18 DIAGNOSIS — K219 Gastro-esophageal reflux disease without esophagitis: Secondary | ICD-10-CM

## 2017-08-18 MED ORDER — PANTOPRAZOLE SODIUM 40 MG PO TBEC: 40.0000 mg | DELAYED_RELEASE_TABLET | Freq: Every day | ORAL | 0 refills | Status: DC

## 2017-08-18 MED ORDER — LOSARTAN POTASSIUM 50 MG PO TABS: 50.0000 mg | ORAL_TABLET | Freq: Every day | ORAL | 0 refills | Status: DC

## 2017-08-22 NOTE — Telephone Encounter (Signed)
Updated.

## 2017-09-12 ENCOUNTER — Other Ambulatory Visit: Payer: Self-pay | Admitting: Family Medicine

## 2017-09-12 DIAGNOSIS — I10 Essential (primary) hypertension: Secondary | ICD-10-CM

## 2017-09-12 DIAGNOSIS — K219 Gastro-esophageal reflux disease without esophagitis: Secondary | ICD-10-CM

## 2017-10-12 ENCOUNTER — Other Ambulatory Visit: Payer: Self-pay | Admitting: Family Medicine

## 2017-10-12 DIAGNOSIS — J302 Other seasonal allergic rhinitis: Secondary | ICD-10-CM

## 2017-10-17 ENCOUNTER — Ambulatory Visit: Payer: Medicare Other | Admitting: Family Medicine

## 2017-10-31 ENCOUNTER — Other Ambulatory Visit: Payer: Self-pay | Admitting: Family Medicine

## 2017-10-31 DIAGNOSIS — K219 Gastro-esophageal reflux disease without esophagitis: Secondary | ICD-10-CM

## 2017-10-31 DIAGNOSIS — I10 Essential (primary) hypertension: Secondary | ICD-10-CM

## 2017-10-31 DIAGNOSIS — Z794 Long term (current) use of insulin: Secondary | ICD-10-CM

## 2017-10-31 DIAGNOSIS — E1140 Type 2 diabetes mellitus with diabetic neuropathy, unspecified: Secondary | ICD-10-CM

## 2017-11-15 ENCOUNTER — Other Ambulatory Visit: Payer: Self-pay | Admitting: Family Medicine

## 2017-11-15 DIAGNOSIS — J302 Other seasonal allergic rhinitis: Secondary | ICD-10-CM

## 2017-11-28 ENCOUNTER — Other Ambulatory Visit: Payer: Self-pay | Admitting: Family Medicine

## 2017-11-28 DIAGNOSIS — K219 Gastro-esophageal reflux disease without esophagitis: Secondary | ICD-10-CM

## 2017-11-28 DIAGNOSIS — I10 Essential (primary) hypertension: Secondary | ICD-10-CM

## 2017-11-29 MED ORDER — PANTOPRAZOLE SODIUM 40 MG PO TBEC: 40.0000 mg | DELAYED_RELEASE_TABLET | Freq: Every day | ORAL | 0 refills | Status: DC

## 2017-11-29 MED ORDER — LOSARTAN POTASSIUM 50 MG PO TABS: 50.0000 mg | ORAL_TABLET | Freq: Every day | ORAL | 0 refills | Status: DC

## 2017-12-26 ENCOUNTER — Other Ambulatory Visit: Payer: Self-pay | Admitting: *Deleted

## 2017-12-26 DIAGNOSIS — K219 Gastro-esophageal reflux disease without esophagitis: Secondary | ICD-10-CM

## 2017-12-26 DIAGNOSIS — I10 Essential (primary) hypertension: Secondary | ICD-10-CM

## 2017-12-26 MED ORDER — PANTOPRAZOLE SODIUM 40 MG PO TBEC: 40.0000 mg | DELAYED_RELEASE_TABLET | Freq: Every day | ORAL | 0 refills | Status: DC

## 2017-12-26 MED ORDER — LOSARTAN POTASSIUM 50 MG PO TABS: 50.0000 mg | ORAL_TABLET | Freq: Every day | ORAL | 0 refills | Status: DC

## 2018-03-28 ENCOUNTER — Other Ambulatory Visit: Payer: Self-pay | Admitting: Family Medicine

## 2018-03-28 DIAGNOSIS — K219 Gastro-esophageal reflux disease without esophagitis: Secondary | ICD-10-CM

## 2018-03-28 DIAGNOSIS — I10 Essential (primary) hypertension: Secondary | ICD-10-CM

## 2018-04-21 ENCOUNTER — Other Ambulatory Visit: Payer: Self-pay | Admitting: Family Medicine

## 2018-04-21 DIAGNOSIS — Z794 Long term (current) use of insulin: Secondary | ICD-10-CM

## 2018-04-21 DIAGNOSIS — E1140 Type 2 diabetes mellitus with diabetic neuropathy, unspecified: Secondary | ICD-10-CM

## 2018-04-21 DIAGNOSIS — I10 Essential (primary) hypertension: Secondary | ICD-10-CM

## 2018-05-19 ENCOUNTER — Ambulatory Visit: Payer: Medicare Other | Admitting: *Deleted

## 2018-06-27 ENCOUNTER — Other Ambulatory Visit: Payer: Self-pay | Admitting: Family Medicine

## 2018-06-27 DIAGNOSIS — I10 Essential (primary) hypertension: Secondary | ICD-10-CM

## 2018-06-27 DIAGNOSIS — K219 Gastro-esophageal reflux disease without esophagitis: Secondary | ICD-10-CM

## 2018-08-28 ENCOUNTER — Other Ambulatory Visit: Payer: Self-pay | Admitting: Family Medicine

## 2018-08-28 DIAGNOSIS — I10 Essential (primary) hypertension: Secondary | ICD-10-CM

## 2018-08-31 ENCOUNTER — Other Ambulatory Visit: Payer: Self-pay | Admitting: Family Medicine

## 2018-08-31 DIAGNOSIS — K219 Gastro-esophageal reflux disease without esophagitis: Secondary | ICD-10-CM

## 2018-08-31 DIAGNOSIS — I10 Essential (primary) hypertension: Secondary | ICD-10-CM

## 2018-09-01 ENCOUNTER — Encounter (HOSPITAL_COMMUNITY): Payer: Self-pay

## 2018-09-01 ENCOUNTER — Emergency Department (HOSPITAL_COMMUNITY)
Admission: EM | Admit: 2018-09-01 | Discharge: 2018-09-01 | Disposition: A | Discharge status: Home / Self Care | Payer: Medicare Other | Attending: Emergency Medicine | Admitting: Emergency Medicine

## 2018-09-01 ENCOUNTER — Emergency Department (HOSPITAL_COMMUNITY): Payer: Medicare Other

## 2018-09-01 ENCOUNTER — Other Ambulatory Visit: Payer: Self-pay

## 2018-09-01 DIAGNOSIS — J45909 Unspecified asthma, uncomplicated: Secondary | ICD-10-CM | POA: Diagnosis not present

## 2018-09-01 DIAGNOSIS — Z7982 Long term (current) use of aspirin: Secondary | ICD-10-CM | POA: Diagnosis not present

## 2018-09-01 DIAGNOSIS — E119 Type 2 diabetes mellitus without complications: Secondary | ICD-10-CM | POA: Insufficient documentation

## 2018-09-01 DIAGNOSIS — Z7984 Long term (current) use of oral hypoglycemic drugs: Secondary | ICD-10-CM | POA: Diagnosis not present

## 2018-09-01 DIAGNOSIS — I10 Essential (primary) hypertension: Secondary | ICD-10-CM | POA: Diagnosis not present

## 2018-09-01 DIAGNOSIS — Z79899 Other long term (current) drug therapy: Secondary | ICD-10-CM | POA: Diagnosis not present

## 2018-09-01 DIAGNOSIS — J101 Influenza due to other identified influenza virus with other respiratory manifestations: Secondary | ICD-10-CM | POA: Insufficient documentation

## 2018-09-01 DIAGNOSIS — R0602 Shortness of breath: Secondary | ICD-10-CM | POA: Diagnosis present

## 2018-09-01 LAB — INFLUENZA PANEL BY PCR (TYPE A & B)
Influenza A By PCR: POSITIVE — AB
Influenza B By PCR: NEGATIVE

## 2018-09-01 MED ORDER — HYDROCODONE-HOMATROPINE 5-1.5 MG/5ML PO SYRP
5.0000 mL | ORAL_SOLUTION | Freq: Once | ORAL | Status: AC
  Administered 2018-09-01: 5 mL via ORAL
  Filled 2018-09-01: qty 5

## 2018-09-01 MED ORDER — HYDROCODONE-HOMATROPINE 5-1.5 MG/5ML PO SYRP: ORAL_SOLUTION | ORAL | 0 refills | Status: DC

## 2018-09-01 MED ORDER — IPRATROPIUM-ALBUTEROL 0.5-2.5 (3) MG/3ML IN SOLN
3.0000 mL | Freq: Once | RESPIRATORY_TRACT | Status: AC
  Administered 2018-09-01: 3 mL via RESPIRATORY_TRACT
  Filled 2018-09-01: qty 3

## 2018-09-01 NOTE — ED Notes (Signed)
Patient verbalizes understanding of discharge instructions. Opportunity for questioning and answers were provided. Pt discharged from ED. 

## 2018-09-01 NOTE — Discharge Instructions (Addendum)
1.  Use your albuterol inhaler every 4-6 hours for the next several days until your cough is improving.  You may use Hycodan syrup 1 to 2 teaspoons every 6 hours as needed for severe cough or nighttime cough.  This will not cure the cough but helps with the symptoms. 2.  Take Tylenol for body aches or fever.  Stay well-hydrated and rest. 3.  You have the influenza virus.  Try to avoid contacting others, cover your mouth and wash your hands frequently. 4.  Return to the emergency department if you are developing worsening difficulty breathing chest pain or other concerning symptoms.

## 2018-09-01 NOTE — ED Triage Notes (Addendum)
Pt from home; pt went to PCP on Tuesday, was diagnosed w/ pneumonia; was given 2 antibiotics (amoxicillin and azithromyacin); pt states she has been taking them as prescribed;  this am pt woke with sob, N/V, and central chest heaviness, no radiation; pt has productive cough (yellow sputum); Hx HTN, DM

## 2018-09-01 NOTE — ED Provider Notes (Signed)
[facility] EMER[facility]ENC[facility] DEPARTMENT Provider Note   CSN: [id number] Arrival date & time: 09/01/18  4742    History   Chief Complaint Chief Complaint  Patient presents with  . Shortness of Breath    HPI Carla Jensen is a 58 y.o. female.     HPI Patient started having a harsh cough about 5 days ago.  She reports that she also felt a lot of nasal congestion and sinus pressure.  She reports she has had some body aches and general fatigue.  She saw her doctor 2 days ago and was diagnosed with pneumonia empirically.  She denies chest x-rays.  She was started on Augmentin and Zithromax.  She reports that she was told that she would feel a lot better in the day.  She reports over the past 2 days she feels much worse.  She is coughing a lot at night.  Is keeping her awake.  She reports feeling achy and tired.  She does feel somewhat short of breath.  Patient has history of asthma.  Reports she is only using her albuterol periodically however Past Medical History:  Diagnosis Date  . Allergy   . Anemia   . Arthritis   . Asthma   . Chickenpox   . Depression   . Diabetes mellitus without complication ([place])   . GERD (gastroesophageal reflux disease)   . Hypertension   . Stroke ([place])   . 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 Bullous dermatitis 08/16/2014  . Sinusitis, acute maxillary 08/12/2014  . Skin infection 08/12/2014  . Dermatitis 08/12/2014  . Obesity (BMI 30-39.9) 10/19/2013  . Microcytic anemia 01/04/2013  . GERD (gastroesophageal reflux disease) 01/04/2013  . ICH (intracerebral hemorrhage) ([place]) 12/25/2012  . Unspecified asthma, with exacerbation 11/29/2012  . Lumbar herniated disc 11/29/2012  . Diabetes ([place]) 11/29/2012  . HTN (hypertension) 11/29/2012    Past Surgical History:  Procedure Laterality Date  . ACHILLES TENDON REPAIR Left 1/08   Dr.Norris   . ACHILLES TENDON REPAIR Right 7/09   Dr.Norris  . ELBOW SURGERY    . KNEE ARTHROSCOPY Left 5/06  . TENDON REPAIR Right 3/07   Elbow     OB History   No obstetric history on file.      Home Medications    Prior to Admission medications   Medication Sig Start Date End Date Taking? Authorizing Provider  albuterol (PROVENTIL HFA;VENTOLIN HFA) 108 (90 Base) MCG/ACT inhaler Inhale 2 puffs into the lungs every 6 (six) hours as needed for wheezing or shortness of breath.   Yes [provider]  amLODipine (NORVASC) 5 MG tablet Take 1 tablet (5 mg total) by mouth daily. Need ov before any more refills Patient taking differently: Take 5 mg by mouth daily.  08/28/18  Yes Roma Schanz R, DO  amoxicillin (AMOXIL) 500 MG tablet Take 1,000 mg by mouth 3 (three) times daily. 08/30/18  Yes [provider]  aspirin 81 MG tablet Take 81 mg by mouth daily.   Yes [provider]  atorvastatin (LIPITOR) 10 MG tablet Take 10 mg by mouth at bedtime. 08/21/18  Yes [provider]  azelastine (ASTELIN) 0.1 % nasal spray Place 2 sprays into both nostrils 2 (two) times daily. Use in each nostril as directed 09/16/15  Yes Saguier, Percell Miller, PA-C  azithromycin (ZITHROMAX) 250 MG tablet Take 250 mg by mouth daily. Take 500  mg day one and 250 mg  By mouth every day for 4 days 08/30/18  Yes [provider]  benzonatate (TESSALON) 200 MG capsule Take 200 mg by mouth 3 (three) times daily as needed for cough.  08/30/18  Yes [provider]  CINNAMON PO Take 500 mg by mouth 2 (two) times daily.    Yes [provider]  ferrous sulfate 325 (65 FE) MG tablet Take 650 mg by mouth daily with breakfast.    Yes [provider]  fluticasone (FLONASE) 50 MCG/ACT nasal spray SPRAY 2 SPRAYS INTO EACH NOSTRIL EVERY DAY Patient taking differently: Place 2 sprays into both nostrils daily.  11/16/17  Yes Roma Schanz R, DO  fluticasone (FLOVENT HFA) 110  MCG/ACT inhaler Inhale 2 puffs into the lungs 2 (two) times daily. 09/24/15  Yes Saguier, Percell Miller, PA-C  gabapentin (NEURONTIN) 300 MG capsule TAKE 1 CAPSULE BY MOUTH  TWICE DAILY Patient taking differently: Take 300 mg by mouth 2 (two) times daily.  04/24/18  Yes Roma Schanz R, DO  losartan (COZAAR) 50 MG tablet TAKE 1 TABLET BY MOUTH  DAILY Patient taking differently: Take 100 mg by mouth daily.  06/30/18  Yes Roma Schanz R, DO  metFORMIN (GLUCOPHAGE) 500 MG tablet Take 500 mg by mouth 2 (two) times daily. 08/21/18  Yes [provider]  montelukast (SINGULAIR) 10 MG tablet Take 1 tablet (10 mg total) by mouth at bedtime. 04/15/17  Yes Roma Schanz R, DO  Multiple Vitamin (MULTIVITAMIN) tablet Take 1 tablet by mouth daily.   Yes [provider]  naproxen sodium (ANAPROX) 220 MG tablet Take 440 mg by mouth daily. For pain   Yes [provider]  pantoprazole (PROTONIX) 40 MG tablet TAKE 1 TABLET BY MOUTH  DAILY Patient taking differently: Take 40 mg by mouth daily.  06/30/18  Yes Roma Schanz R, DO  Senna (NATURAL VEGETABLE LAXATIVE) 65-325 MG TABS Take 1 tablet by mouth daily.    Yes [provider]  vitamin B-12 (CYANOCOBALAMIN) 1000 MCG tablet Take 1,000 mcg by mouth daily.   Yes [provider]  vitamin C (ASCORBIC ACID) 500 MG tablet Take 500 mg by mouth 2 (two) times daily.    Yes [provider]  Blood Glucose Monitoring Suppl (ONE TOUCH ULTRA SYSTEM KIT) W/DEVICE KIT 1 kit by Does not apply route once. Dx. 250.00 11/30/12   Carollee Herter, Yvonne R, DO  glucose blood test strip Use as instructed. Pt tests sugars once daily. Dx. 250.00 11/30/12   Carollee Herter, Alferd Apa, DO  hydrochlorothiazide (HYDRODIURIL) 25 MG tablet Take 1 tablet (25 mg total) by mouth daily. Patient not taking: Reported on 05/18/2017 04/28/15   Carollee Herter, Alferd Apa, DO  HYDROcodone-homatropine [facility]) 5-1.5 MG/5ML syrup 5-45m every 6 hours as  needed for bad cough 09/01/18   PCharlesetta Shanks MD  hydrOXYzine (ATARAX/VISTARIL) 25 MG tablet Take 1 tablet (25 mg total) by mouth every 8 (eight) hours as needed. Dr. WElvera LennoxPatient not taking: Reported on 09/01/2018 04/15/17   LCarollee Herter YKendrick FriesR, DO  Insulin Syringe-Needle U-100 (RELION INSULIN SYR 0.5ML/31G) 31G X 5/16" 0.5 ML MISC by Does not apply route.    [provider]  Lancets Thin MISC Use as directed. Pt tests sugars once daily. Dx. 250.00 11/30/12   LCarollee Herter Yvonne R, DO  tiZANidine (ZANAFLEX) 4 MG tablet TAKE 1 TABLET BY MOUTH EVERY 6 (SIX) HOURS AS NEEDED FOR MUSCLE SPASMS. Patient not taking:  Reported on 09/01/2018 01/09/16   Ann Held, DO    Family History Family History  Problem Relation Age of Onset  . High blood pressure Father   . Arthritis Mother   . High blood pressure Mother   . Diabetes Mother   . High blood pressure Sister   . Colon cancer Neg Hx   . Esophageal cancer Neg Hx   . Pancreatic cancer Neg Hx   . Rectal cancer Neg Hx   . Stomach cancer Neg Hx     Social History Social History   Tobacco Use  . Smoking status: Never Smoker  . Smokeless tobacco: Never Used  Substance Use Topics  . Alcohol use: No    Alcohol/week: 0.0 standard drinks  . Drug use: No     Allergies   Patient has no known allergies.   Review of Systems Review of Systems 10 Systems reviewed and are negative for acute change except as noted in the HPI.   Physical Exam Updated Vital Signs BP (!) 165/61   Pulse 76   Temp 98.3 F (36.8 C) (Oral)   Resp 16   Ht '5\' 4"'$  (1.626 m)   Wt 122.5 kg   LMP 08/05/2010   SpO2 98%   BMI 46.35 kg/m   Physical Exam Constitutional:      Comments: Patient is alert and nontoxic.  She has harsh paroxysmal cough.  No respiratory distress at rest.  HENT:     Head: Normocephalic and atraumatic.  Eyes:     Extraocular Movements: Extraocular movements intact.  Cardiovascular:     Rate and Rhythm: Normal  rate and regular rhythm.     Pulses: Normal pulses.  Pulmonary:     Effort: Pulmonary effort is normal.     Breath sounds: Normal breath sounds.     Comments: Patient has harsh cough paroxysm with deep inspiration.  Breath sounds however good to the bases without gross wheeze or rhonchi. Musculoskeletal: Normal range of motion.        General: No swelling or tenderness.     Right lower leg: No edema.     Left lower leg: No edema.  Skin:    General: Skin is warm and dry.  Neurological:     General: No focal deficit present.     Mental Status: She is oriented to person, place, and time.     Coordination: Coordination normal.  Psychiatric:        Mood and Affect: Mood normal.      ED Treatments / Results  Labs (all labs ordered are listed, but only abnormal results are displayed) Labs Reviewed  INFLUENZA PANEL BY PCR (TYPE A & B) - Abnormal; Notable for the following components:      Result Value   Influenza A By PCR POSITIVE (*)    All other components within normal limits    EKG EKG Interpretation  Date/Time:  Friday September 01 2018 08:50:52 EST Ventricular Rate:  100 PR Interval:    QRS Duration: 101 QT Interval:  386 QTC Calculation: 498 R Axis:   -37 Text Interpretation:  Sinus tachycardia Left axis deviation Borderline prolonged QT interval Baseline wander in lead(s) II III aVR aVL aVF V1 V2 V3 V4 V5 V6 sinus tach otherwise no sig change from old Confirmed by Charlesetta Shanks [phone]) on 09/01/2018 9:02:43 AM   Radiology Dg Chest 2 View  Result Date: 09/01/2018 CLINICAL DATA:  Patient was diagnosed with pneumonia last Tuesday has been on  antibiotics. EXAM: CHEST - 2 VIEW COMPARISON:  January 07, 2014 FINDINGS: The heart size and mediastinal contours are within normal limits. Both lungs are clear. The visualized skeletal structures are stable. IMPRESSION: No active cardiopulmonary disease. Electronically Signed   By: Abelardo Diesel M.D.   On: 09/01/2018 09:29     Procedures Procedures (including critical care time)  Medications Ordered in ED Medications  ipratropium-albuterol (DUONEB) 0.5-2.5 (3) MG/3ML nebulizer solution 3 mL (3 mLs Nebulization Given 09/01/18 0929)  HYDROcodone-homatropine (HYCODAN) 5-1.5 MG/5ML syrup 5 mL (5 mLs Oral Given 09/01/18 8101)     Initial Impression / Assessment and Plan / ED Course  I have reviewed the triage vital signs and the nursing notes.  Pertinent labs & imaging results that were available during my care of the patient were reviewed by me and considered in my medical decision making (see chart for details).       Patient has history of asthma.  She is influenza A positive.  However patient does not appear to be having severe asthma attack.  He does have good airflow and does not have respiratory distress.  I have instructed the patient to use her inhaler every 4-6 hours for the next couple of days until symptoms are improving.  She is out of the window for use of antiviral medication.  She is nontoxic with stable vital signs.  Appropriate at this time for continued outpatient management.  I have advised her she can discontinue antibiotics at this time because currently I do not see signs of secondary bacterial pneumonia.  Return precautions reviewed.  Final Clinical Impressions(s) / ED Diagnoses   Final diagnoses:  Influenza A    ED Discharge Orders         Ordered    HYDROcodone-homatropine (HYCODAN) 5-1.5 MG/5ML syrup     09/01/18 1114           Charlesetta Shanks, MD 09/01/18 1131

## 2018-10-06 ENCOUNTER — Telehealth: Payer: Self-pay | Admitting: *Deleted

## 2018-10-06 NOTE — Telephone Encounter (Signed)
Left message on machine to see if we can set up for a virtual visit. We have not see her in a while.

## 2018-10-11 ENCOUNTER — Other Ambulatory Visit: Payer: Self-pay | Admitting: Family Medicine

## 2018-10-11 DIAGNOSIS — E1140 Type 2 diabetes mellitus with diabetic neuropathy, unspecified: Secondary | ICD-10-CM

## 2018-10-11 DIAGNOSIS — Z794 Long term (current) use of insulin: Principal | ICD-10-CM

## 2018-11-01 ENCOUNTER — Observation Stay (HOSPITAL_COMMUNITY)
Admission: EM | Admit: 2018-11-01 | Discharge: 2018-11-03 | Disposition: A | Discharge status: Home / Self Care | Payer: Medicare Other | Attending: Internal Medicine | Admitting: Internal Medicine

## 2018-11-01 ENCOUNTER — Encounter (HOSPITAL_COMMUNITY): Payer: Self-pay

## 2018-11-01 ENCOUNTER — Other Ambulatory Visit: Payer: Self-pay

## 2018-11-01 ENCOUNTER — Emergency Department (HOSPITAL_COMMUNITY): Payer: Medicare Other

## 2018-11-01 DIAGNOSIS — J019 Acute sinusitis, unspecified: Secondary | ICD-10-CM

## 2018-11-01 DIAGNOSIS — N12 Tubulo-interstitial nephritis, not specified as acute or chronic: Principal | ICD-10-CM

## 2018-11-01 DIAGNOSIS — J4520 Mild intermittent asthma, uncomplicated: Secondary | ICD-10-CM

## 2018-11-01 DIAGNOSIS — Z79899 Other long term (current) drug therapy: Secondary | ICD-10-CM | POA: Insufficient documentation

## 2018-11-01 DIAGNOSIS — E669 Obesity, unspecified: Secondary | ICD-10-CM | POA: Diagnosis not present

## 2018-11-01 DIAGNOSIS — R918 Other nonspecific abnormal finding of lung field: Secondary | ICD-10-CM | POA: Insufficient documentation

## 2018-11-01 DIAGNOSIS — J302 Other seasonal allergic rhinitis: Secondary | ICD-10-CM

## 2018-11-01 DIAGNOSIS — Z982 Presence of cerebrospinal fluid drainage device: Secondary | ICD-10-CM | POA: Insufficient documentation

## 2018-11-01 DIAGNOSIS — Z7982 Long term (current) use of aspirin: Secondary | ICD-10-CM | POA: Diagnosis not present

## 2018-11-01 DIAGNOSIS — E119 Type 2 diabetes mellitus without complications: Secondary | ICD-10-CM | POA: Diagnosis not present

## 2018-11-01 DIAGNOSIS — A419 Sepsis, unspecified organism: Secondary | ICD-10-CM

## 2018-11-01 DIAGNOSIS — F329 Major depressive disorder, single episode, unspecified: Secondary | ICD-10-CM | POA: Diagnosis not present

## 2018-11-01 DIAGNOSIS — J45909 Unspecified asthma, uncomplicated: Secondary | ICD-10-CM | POA: Diagnosis not present

## 2018-11-01 DIAGNOSIS — I10 Essential (primary) hypertension: Secondary | ICD-10-CM | POA: Diagnosis not present

## 2018-11-01 DIAGNOSIS — Z794 Long term (current) use of insulin: Secondary | ICD-10-CM | POA: Insufficient documentation

## 2018-11-01 DIAGNOSIS — J0100 Acute maxillary sinusitis, unspecified: Secondary | ICD-10-CM

## 2018-11-01 DIAGNOSIS — Z6839 Body mass index (BMI) 39.0-39.9, adult: Secondary | ICD-10-CM | POA: Diagnosis not present

## 2018-11-01 DIAGNOSIS — D649 Anemia, unspecified: Secondary | ICD-10-CM

## 2018-11-01 DIAGNOSIS — J0101 Acute recurrent maxillary sinusitis: Secondary | ICD-10-CM

## 2018-11-01 DIAGNOSIS — Z8673 Personal history of transient ischemic attack (TIA), and cerebral infarction without residual deficits: Secondary | ICD-10-CM | POA: Insufficient documentation

## 2018-11-01 DIAGNOSIS — M1990 Unspecified osteoarthritis, unspecified site: Secondary | ICD-10-CM | POA: Diagnosis not present

## 2018-11-01 DIAGNOSIS — K219 Gastro-esophageal reflux disease without esophagitis: Secondary | ICD-10-CM | POA: Insufficient documentation

## 2018-11-01 DIAGNOSIS — Z7951 Long term (current) use of inhaled steroids: Secondary | ICD-10-CM | POA: Insufficient documentation

## 2018-11-01 LAB — COMPREHENSIVE METABOLIC PANEL
ALT: 18 U/L (ref 0–44)
AST: 17 U/L (ref 15–41)
Albumin: 3.7 g/dL (ref 3.5–5.0)
Alkaline Phosphatase: 89 U/L (ref 38–126)
Anion gap: 9 (ref 5–15)
BUN: 15 mg/dL (ref 6–20)
CO2: 22 mmol/L (ref 22–32)
Calcium: 9.1 mg/dL (ref 8.9–10.3)
Chloride: 107 mmol/L (ref 98–111)
Creatinine, Ser: 0.85 mg/dL (ref 0.44–1.00)
GFR calc Af Amer: >60 mL/min (ref >60)
GFR calc non Af Amer: >60 mL/min (ref >60)
Glucose, Bld: 160 mg/dL — ABNORMAL HIGH (ref 70–99)
Potassium: 3.6 mmol/L (ref 3.5–5.1)
Sodium: 138 mmol/L (ref 135–145)
Total Bilirubin: 0.5 mg/dL (ref 0.3–1.2)
Total Protein: 7.8 g/dL (ref 6.5–8.1)

## 2018-11-01 LAB — URINALYSIS, ROUTINE W REFLEX MICROSCOPIC
Bilirubin Urine: NEGATIVE
Glucose, UA: NEGATIVE mg/dL
Ketones, ur: NEGATIVE mg/dL
Nitrite: NEGATIVE
Protein, ur: 30 mg/dL — AB
RBC / HPF: >50 RBC/hpf — ABNORMAL HIGH (ref 0–5)
Specific Gravity, Urine: 1.009 (ref 1.005–1.030)
WBC, UA: >50 WBC/hpf — ABNORMAL HIGH (ref 0–5)
pH: 6 (ref 5.0–8.0)

## 2018-11-01 LAB — HIV ANTIBODY (ROUTINE TESTING W REFLEX): HIV Screen 4th Generation wRfx: NONREACTIVE

## 2018-11-01 LAB — CBC WITH DIFFERENTIAL/PLATELET
Abs Immature Granulocytes: 0.05 10*3/uL (ref 0.00–0.07)
Basophils Absolute: 0 10*3/uL (ref 0.0–0.1)
Basophils Relative: 0 %
Eosinophils Absolute: 0 10*3/uL (ref 0.0–0.5)
Eosinophils Relative: 0 %
HCT: 32.3 % — ABNORMAL LOW (ref 36.0–46.0)
Hemoglobin: 9.5 g/dL — ABNORMAL LOW (ref 12.0–15.0)
Immature Granulocytes: 1 %
Lymphocytes Relative: 8 %
Lymphs Abs: 0.9 10*3/uL (ref 0.7–4.0)
MCH: 22.8 pg — ABNORMAL LOW (ref 26.0–34.0)
MCHC: 29.4 g/dL — ABNORMAL LOW (ref 30.0–36.0)
MCV: 77.6 fL — ABNORMAL LOW (ref 80.0–100.0)
Monocytes Absolute: 0.4 10*3/uL (ref 0.1–1.0)
Monocytes Relative: 4 %
Neutro Abs: 9.5 10*3/uL — ABNORMAL HIGH (ref 1.7–7.7)
Neutrophils Relative %: 87 %
Platelets: 341 10*3/uL (ref 150–400)
RBC: 4.16 MIL/uL (ref 3.87–5.11)
RDW: 17.2 % — ABNORMAL HIGH (ref 11.5–15.5)
WBC: 10.9 10*3/uL — ABNORMAL HIGH (ref 4.0–10.5)
nRBC: 0 % (ref 0.0–0.2)

## 2018-11-01 LAB — LACTIC ACID, PLASMA: Lactic Acid, Venous: 1.8 mmol/L (ref 0.5–1.9)

## 2018-11-01 LAB — HEMOGLOBIN A1C
Hgb A1c MFr Bld: 6.7 % — ABNORMAL HIGH (ref 4.8–5.6)
Mean Plasma Glucose: 145.59 mg/dL

## 2018-11-01 LAB — IRON AND TIBC
Iron: 8 ug/dL — ABNORMAL LOW (ref 28–170)
Saturation Ratios: 3 % — ABNORMAL LOW (ref 10.4–31.8)
TIBC: 263 ug/dL (ref 250–450)
UIBC: 255 ug/dL

## 2018-11-01 LAB — GLUCOSE, CAPILLARY
Glucose-Capillary: 113 mg/dL — ABNORMAL HIGH (ref 70–99)
Glucose-Capillary: 120 mg/dL — ABNORMAL HIGH (ref 70–99)
Glucose-Capillary: 108 mg/dL — ABNORMAL HIGH (ref 70–99)
Glucose-Capillary: 116 mg/dL — ABNORMAL HIGH (ref 70–99)

## 2018-11-01 LAB — FERRITIN: Ferritin: 38 ng/mL (ref 11–307)

## 2018-11-01 MED ORDER — SODIUM CHLORIDE 0.9 % IV SOLN
2.0000 g | INTRAVENOUS | Status: DC
  Administered 2018-11-02 – 2018-11-03 (×2): 2 g via INTRAVENOUS
  Filled 2018-11-01 (×2): qty 2

## 2018-11-01 MED ORDER — MONTELUKAST SODIUM 10 MG PO TABS
10.0000 mg | ORAL_TABLET | Freq: Every day | ORAL | Status: DC
  Administered 2018-11-01 – 2018-11-02 (×2): 10 mg via ORAL
  Filled 2018-11-01 (×2): qty 1

## 2018-11-01 MED ORDER — PANTOPRAZOLE SODIUM 40 MG PO TBEC
40.0000 mg | DELAYED_RELEASE_TABLET | Freq: Every day | ORAL | Status: DC
  Administered 2018-11-01 – 2018-11-03 (×3): 40 mg via ORAL
  Filled 2018-11-01 (×3): qty 1

## 2018-11-01 MED ORDER — SODIUM CHLORIDE 0.9 % IV SOLN
1.0000 g | Freq: Once | INTRAVENOUS | Status: AC
  Administered 2018-11-01: 1 g via INTRAVENOUS
  Filled 2018-11-01: qty 10

## 2018-11-01 MED ORDER — LOSARTAN POTASSIUM 50 MG PO TABS
100.0000 mg | ORAL_TABLET | Freq: Every day | ORAL | Status: DC
  Administered 2018-11-01 – 2018-11-03 (×3): 100 mg via ORAL
  Filled 2018-11-01 (×3): qty 2

## 2018-11-01 MED ORDER — MORPHINE SULFATE (PF) 4 MG/ML IV SOLN
4.0000 mg | Freq: Once | INTRAVENOUS | Status: AC
  Administered 2018-11-01: 4 mg via INTRAVENOUS
  Filled 2018-11-01: qty 1

## 2018-11-01 MED ORDER — SODIUM CHLORIDE 0.9 % IV SOLN
1.0000 g | Freq: Once | INTRAVENOUS | Status: AC
  Administered 2018-11-01: 1 g via INTRAVENOUS
  Filled 2018-11-01: qty 1

## 2018-11-01 MED ORDER — ENOXAPARIN SODIUM 40 MG/0.4ML ~~LOC~~ SOLN
40.0000 mg | SUBCUTANEOUS | Status: DC
  Administered 2018-11-01 – 2018-11-03 (×3): 40 mg via SUBCUTANEOUS
  Filled 2018-11-01 (×3): qty 0.4

## 2018-11-01 MED ORDER — ALBUTEROL SULFATE HFA 108 (90 BASE) MCG/ACT IN AERS: 1.0000 | INHALATION_SPRAY | RESPIRATORY_TRACT | Status: DC | PRN

## 2018-11-01 MED ORDER — SODIUM CHLORIDE 0.9 % IV BOLUS
1000.0000 mL | Freq: Once | INTRAVENOUS | Status: AC
  Administered 2018-11-01: 04:00:00 1000 mL via INTRAVENOUS

## 2018-11-01 MED ORDER — ONDANSETRON HCL 4 MG/2ML IJ SOLN
4.0000 mg | Freq: Four times a day (QID) | INTRAMUSCULAR | Status: DC | PRN
  Administered 2018-11-02: 4 mg via INTRAVENOUS
  Filled 2018-11-01: qty 2

## 2018-11-01 MED ORDER — SODIUM CHLORIDE 0.9 % IV SOLN
INTRAVENOUS | Status: AC
  Administered 2018-11-01 (×2): via INTRAVENOUS

## 2018-11-01 MED ORDER — ASPIRIN 81 MG PO CHEW
81.0000 mg | CHEWABLE_TABLET | Freq: Every day | ORAL | Status: DC
  Administered 2018-11-01 – 2018-11-03 (×3): 81 mg via ORAL
  Filled 2018-11-01 (×3): qty 1

## 2018-11-01 MED ORDER — LORATADINE 10 MG PO TABS
10.0000 mg | ORAL_TABLET | Freq: Every day | ORAL | Status: DC
  Administered 2018-11-01 – 2018-11-03 (×3): 10 mg via ORAL
  Filled 2018-11-01 (×3): qty 1

## 2018-11-01 MED ORDER — VITAMIN B-12 1000 MCG PO TABS
1000.0000 ug | ORAL_TABLET | Freq: Every day | ORAL | Status: DC
  Administered 2018-11-01 – 2018-11-03 (×3): 1000 ug via ORAL
  Filled 2018-11-01 (×3): qty 1

## 2018-11-01 MED ORDER — VITAMIN C 500 MG PO TABS
500.0000 mg | ORAL_TABLET | Freq: Two times a day (BID) | ORAL | Status: DC
  Administered 2018-11-01 – 2018-11-03 (×5): 500 mg via ORAL
  Filled 2018-11-01 (×5): qty 1

## 2018-11-01 MED ORDER — ALBUTEROL SULFATE (2.5 MG/3ML) 0.083% IN NEBU: 2.5000 mg | INHALATION_SOLUTION | RESPIRATORY_TRACT | Status: DC | PRN

## 2018-11-01 MED ORDER — FLUTICASONE PROPIONATE 50 MCG/ACT NA SUSP
2.0000 | Freq: Every day | NASAL | Status: DC
  Administered 2018-11-01 – 2018-11-03 (×3): 2 via NASAL
  Filled 2018-11-01: qty 16

## 2018-11-01 MED ORDER — ACETAMINOPHEN 325 MG PO TABS
650.0000 mg | ORAL_TABLET | Freq: Four times a day (QID) | ORAL | Status: DC | PRN
  Filled 2018-11-01: qty 2

## 2018-11-01 MED ORDER — MORPHINE SULFATE (PF) 2 MG/ML IV SOLN
1.0000 mg | INTRAVENOUS | Status: DC | PRN
  Administered 2018-11-02 – 2018-11-03 (×3): 1 mg via INTRAVENOUS
  Filled 2018-11-01 (×3): qty 1

## 2018-11-01 MED ORDER — HYDRALAZINE HCL 20 MG/ML IJ SOLN
10.0000 mg | Freq: Four times a day (QID) | INTRAMUSCULAR | Status: DC | PRN
  Administered 2018-11-02: 10 mg via INTRAVENOUS
  Filled 2018-11-01: qty 1

## 2018-11-01 MED ORDER — FERROUS SULFATE 325 (65 FE) MG PO TABS
325.0000 mg | ORAL_TABLET | Freq: Two times a day (BID) | ORAL | Status: DC
  Administered 2018-11-01 – 2018-11-03 (×4): 325 mg via ORAL
  Filled 2018-11-01 (×4): qty 1

## 2018-11-01 MED ORDER — ALBUTEROL SULFATE (2.5 MG/3ML) 0.083% IN NEBU: 2.5000 mg | INHALATION_SOLUTION | Freq: Four times a day (QID) | RESPIRATORY_TRACT | Status: DC | PRN

## 2018-11-01 MED ORDER — INSULIN ASPART 100 UNIT/ML ~~LOC~~ SOLN
0.0000 [IU] | Freq: Three times a day (TID) | SUBCUTANEOUS | Status: DC
  Administered 2018-11-03: 09:00:00 1 [IU] via SUBCUTANEOUS

## 2018-11-01 MED ORDER — ATORVASTATIN CALCIUM 10 MG PO TABS
10.0000 mg | ORAL_TABLET | Freq: Every day | ORAL | Status: DC
  Administered 2018-11-01 – 2018-11-02 (×2): 10 mg via ORAL
  Filled 2018-11-01 (×2): qty 1

## 2018-11-01 MED ORDER — ACETAMINOPHEN 650 MG RE SUPP: 650.0000 mg | Freq: Four times a day (QID) | RECTAL | Status: DC | PRN

## 2018-11-01 NOTE — ED Triage Notes (Signed)
Per EMS, patient coming from home with complaints of chills starting around 11 PM tonight. Patient endorses burning with urination, increased frequency, and cloudy urine that started on Saturday. Denies vomiting and diarrhea, but endorses nausea.   Temperature with EMS was 101.8 oral and was given 1000 mg of Tylenol. EMS also administered 4 mg of Zofran IM.

## 2018-11-01 NOTE — Progress Notes (Signed)
[facility]                                                                              Patient Demographics  Carla Jensen, is a 58 y.o. female, DOB - 01/02/1961, WCH:[id number]  Admit date - 11/01/2018   Admitting Physician Shela Leff, MD  Outpatient Primary MD for the patient is Tsosie Billing, MD  Outpatient specialists:   LOS - 0  days   Medical records reviewed and are as summarized below:    Chief Complaint  Patient presents with  . Possible UTI       Brief summary   Patient is a 58 year old female with history of anemia, arthritis, asthma, depression, diabetes mellitus type 2, GERD, hypertension, CVA, history of SAH with obstructive hydrocephalus status post VP shunt in 2011 presented with fever chills and UTI symptoms.  Patient reported feeling of bladder fullness, urinary frequency, urgency and dysuria for last 3 to 4 days.  Also reported right-sided flank pain with fevers and chills. No shortness of breath, denies any sick contacts or exposure to any individuals with confirmed COVID-19. UA was positive for UTI.  Patient was admitted for right-sided pyelonephritis   Assessment & Plan    Principal Problem: Sepsis secondary to right pyelonephritis and UTI -Patient met sepsis criteria in ED with the tachycardia, leukocytosis, UTI -Continue IV fluid hydration, IV Rocephin -Follow urine cultures and blood cultures.  Active Problems:    Acute sinusitis -Patient reported 1 week history of bilateral frontal and maxillary sinus pain and pressure.  Has history of chronic allergies -Continue Flonase nasal spray, Claritin, Tylenol as needed, continue antibiotics    Chronic microcytic anemia -Anemia panel shows iron deficiency anemia -Patient follows GI, had last colonoscopy in 04/2017 showed internal hemorrhoids, small diverticuli and polyps -Restart iron replacement    Asthma -Currently stable, no wheezing -Continue Singulair,  albuterol inhaler as needed  Essential hypertension Restart losartan, placed on hydralazine IV as needed with parameters   Code Status: Full CODE STATUS DVT Prophylaxis:  Lovenox  Family Communication: Discussed in detail with the patient, all imaging results, lab results explained to the patient    Disposition Plan: In next 24 to 48 hours once urine culture available  Time Spent in minutes 35 minutes  Procedures:  None  Consultants:   None  Antimicrobials:   Anti-infectives (From admission, onward)   Start     Dose/Rate Route Frequency Ordered Stop   11/02/18 0600  cefTRIAXone (ROCEPHIN) 2 g in sodium chloride 0.9 % 100 mL IVPB     2 g 200 mL/hr over 30 Minutes Intravenous Every 24 hours 11/01/18 0613     11/01/18 0615  cefTRIAXone (ROCEPHIN) 1 g in sodium chloride 0.9 % 100 mL IVPB     1 g 200 mL/hr over 30 Minutes Intravenous  Once 11/01/18 0613 11/01/18 1033   11/01/18 0415  cefTRIAXone (ROCEPHIN) 1 g in sodium chloride 0.9 % 100 mL IVPB     1 g 200 mL/hr over 30 Minutes Intravenous  Once 11/01/18 0403 11/01/18 0545          Medications  Scheduled Meds: . enoxaparin (  LOVENOX) injection  40 mg Subcutaneous Q24H  . fluticasone  2 spray Each Nare Daily  . insulin aspart  0-9 Units Subcutaneous TID WC  . loratadine  10 mg Oral Daily   Continuous Infusions: . sodium chloride 150 mL/hr at 11/01/18 0907  . [START ON 11/02/2018] cefTRIAXone (ROCEPHIN)  IV     PRN Meds:.acetaminophen **OR** acetaminophen, albuterol, morphine injection, ondansetron (ZOFRAN) IV      Subjective:   Carla Jensen was seen and examined today.  Low-grade temp of 99.2 F this morning, right back still feels sore.  Otherwise somewhat better from the time of presentation. Patient denies dizziness, chest pain, shortness of breath, abdominal pain, N/V/D/C, new weakness, numbess, tingling.   Objective:   Vitals:   11/01/18 0600 11/01/18 0640 11/01/18 0716 11/01/18 0716  BP: (!) 139/91  129/69  (!) 156/81  Pulse: (!) 120 93  (!) 104  Resp: (!) '21 16  16  '$ Temp:    99.2 F (37.3 C)  TempSrc:    Oral  SpO2: 99% 96%  97%  Weight:   117.4 kg   Height:   '5\' 4"'$  (1.626 m)     Intake/Output Summary (Last 24 hours) at 11/01/2018 1037 Last data filed at 11/01/2018 1003 Gross per 24 hour  Intake 1220 ml  Output 200 ml  Net 1020 ml     Wt Readings from Last 3 Encounters:  11/01/18 117.4 kg  09/01/18 122.5 kg  05/18/17 123.4 kg     Exam  General: Alert and oriented x 3, NAD  Eyes:  HEENT:  Atraumatic, normocephalic, normal oropharynx  Cardiovascular: S1 S2 auscultated, Regular rate and rhythm.  Respiratory: Clear to auscultation bilaterally, no wheezing, rales or rhonchi  Gastrointestinal: Soft, nontender, nondistended, + bowel sounds, mild right CVAT  Ext: no pedal edema bilaterally  Neuro: AAOx3, Cr N's II- XII. Strength 5/5 upper and lower extremities bilaterally  Musculoskeletal: No digital cyanosis, clubbing  Skin: No rashes  Psych: Normal affect and demeanor, alert and oriented x3    Data Reviewed:  I have personally reviewed following labs and imaging studies  Micro Results No results found for this or any previous visit (from the past 240 hour(s)).  Radiology Reports Dg Chest Portable 1 View  Result Date: 11/01/2018 CLINICAL DATA:  Fever and cough EXAM: PORTABLE CHEST 1 VIEW COMPARISON:  09/01/2018 FINDINGS: The heart size and mediastinal contours are within normal limits. Right basilar atelectasis. Ventriculoperitoneal shunt catheter courses along the right chest. The visualized skeletal structures are unremarkable. IMPRESSION: Right basilar opacities, likely atelectasis. Electronically Signed   By: Ulyses Jarred M.D.   On: 11/01/2018 03:35    Lab Data:  CBC: Recent Labs  Lab 11/01/18 0347  WBC 10.9*  NEUTROABS 9.5*  HGB 9.5*  HCT 32.3*  MCV 77.6*  PLT 195   Basic Metabolic Panel: Recent Labs  Lab 11/01/18 0347  NA 138  K 3.6   CL 107  CO2 22  GLUCOSE 160*  BUN 15  CREATININE 0.85  CALCIUM 9.1   GFR: Estimated Creatinine Clearance: 88.7 mL/min (by C-G formula based on SCr of 0.85 mg/dL). Liver Function Tests: Recent Labs  Lab 11/01/18 0347  AST 17  ALT 18  ALKPHOS 89  BILITOT 0.5  PROT 7.8  ALBUMIN 3.7   No results for input(s): LIPASE, AMYLASE in the last 168 hours. No results for input(s): AMMONIA in the last 168 hours. Coagulation Profile: No results for input(s): INR, PROTIME in the last 168 hours. Cardiac  Enzymes: No results for input(s): CKTOTAL, CKMB, CKMBINDEX, TROPONINI in the last 168 hours. BNP (last 3 results) No results for input(s): PROBNP in the last 8760 hours. HbA1C: Recent Labs    11/01/18 0647  HGBA1C 6.7*   CBG: Recent Labs  Lab 11/01/18 0850  GLUCAP 113*   Lipid Profile: No results for input(s): CHOL, HDL, LDLCALC, TRIG, CHOLHDL, LDLDIRECT in the last 72 hours. Thyroid Function Tests: No results for input(s): TSH, T4TOTAL, FREET4, T3FREE, THYROIDAB in the last 72 hours. Anemia Panel: Recent Labs    11/01/18 0647  FERRITIN 38  TIBC 263  IRON 8*   Urine analysis:    Component Value Date/Time   COLORURINE YELLOW 11/01/2018 0305   APPEARANCEUR CLOUDY (A) 11/01/2018 0305   LABSPEC 1.009 11/01/2018 0305   PHURINE 6.0 11/01/2018 0305   GLUCOSEU NEGATIVE 11/01/2018 0305   HGBUR SMALL (A) 11/01/2018 0305   BILIRUBINUR NEGATIVE 11/01/2018 0305   BILIRUBINUR neg 04/15/2017 1034   KETONESUR NEGATIVE 11/01/2018 0305   PROTEINUR 30 (A) 11/01/2018 0305   UROBILINOGEN 0.2 04/15/2017 1034   UROBILINOGEN 1.0 10/04/2009 1754   NITRITE NEGATIVE 11/01/2018 0305   LEUKOCYTESUR LARGE (A) 11/01/2018 0305     Ripudeep Rai M.D. [facility] 11/01/2018, 10:37 AM  Pager: [phone] Between 7am to 7pm - call Pager - [phone]  After 7pm go to [email or web address] - password [email or web address]  Call night coverage person covering after 7pm

## 2018-11-01 NOTE — ED Notes (Signed)
Bed: LS93 Expected date:  Expected time:  Means of arrival:  Comments: 19F chills, fever, UTI?

## 2018-11-01 NOTE — ED Provider Notes (Signed)
[facility] DEPT Provider Note   CSN: [id number] Arrival date & time: 11/01/18  0230    History   Chief Complaint Chief Complaint  Patient presents with  . Possible UTI    HPI Carla Jensen is a 58 y.o. female with a history of diabetes mellitus, arthritis, asthma, CVA, subarachnoid hemorrhage, GERD, and HTN who presents to the emergency department with a chief complaint of chills.   The patient reports a sudden onset chills at approximately 2300 accompanied by nausea, headache, sinus pain and pressure.  She reports she has been having urinary frequency and dysuria along with a nonproductive cough for the last 2 days.  EMS reports the patient had an oral temp of 101.8 in route and was given 1000 g of Tylenol.  Patient reports that she did not know that she had a fever earlier tonight.  She denies vomiting, diarrhea, constipation, vaginal bleeding or discharge, shortness of breath, chest pain, back or flank pain, sore throat, neck pain or stiffness, visual changes, confusion, dizziness, or lightheadedness.  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  No difficulties with the shunt since placement.  She reports a history of recurrent sinus infections.  She reports that she has been compliant with all of her home medications, including her antihypertensives and diabetes medications.  She reports that her blood pressure is normally well controlled.  Her blood sugars have been running in the low 100s.  No known COVID-19 exposures.  She does report that she babysits her grandchildren while their parents have been working, but they have been asymptomatic.     The history is provided by the patient. No language interpreter was used.    Past Medical History:  Diagnosis Date  . Allergy   . Anemia   . Arthritis   . Asthma   . Chickenpox   . Depression   .  Diabetes mellitus without complication ([place])   . GERD (gastroesophageal reflux disease)   . Hypertension   . Stroke ([place])   . Subarachnoid hemorrhage ([place]) 3/11   with obstructive hydrocephalus--Dr.Jenkins    Patient Active Problem List   Diagnosis Date Noted  . Bullous dermatitis 08/16/2014  . Sinusitis, acute maxillary 08/12/2014  . Skin infection 08/12/2014  . Dermatitis 08/12/2014  . Obesity (BMI 30-39.9) 10/19/2013  . Microcytic anemia 01/04/2013  . GERD (gastroesophageal reflux disease) 01/04/2013  . ICH (intracerebral hemorrhage) ([place]) 12/25/2012  . Unspecified asthma, with exacerbation 11/29/2012  . Lumbar herniated disc 11/29/2012  . Diabetes ([place]) 11/29/2012  . HTN (hypertension) 11/29/2012    Past Surgical History:  Procedure Laterality Date  . ACHILLES TENDON REPAIR Left 1/08   Dr.Norris  . ACHILLES TENDON REPAIR Right 7/09   Dr.Norris  . ELBOW SURGERY    . KNEE ARTHROSCOPY Left 5/06  . TENDON REPAIR Right 3/07   Elbow     OB History   No obstetric history on file.      Home Medications    Prior to Admission medications   Medication Sig Start Date End Date Taking? Authorizing Provider  albuterol (PROVENTIL HFA;VENTOLIN HFA) 108 (90 Base) MCG/ACT inhaler Inhale 2 puffs into the lungs every 6 (six) hours as needed for wheezing or shortness of breath.    [provider]  amLODipine (NORVASC) 5 MG tablet Take 1 tablet (5 mg total) by mouth daily. Need ov before any more refills Patient  taking differently: Take 5 mg by mouth daily.  08/28/18   Ann Held, DO  amoxicillin (AMOXIL) 500 MG tablet Take 1,000 mg by mouth 3 (three) times daily. 08/30/18   [provider]  aspirin 81 MG tablet Take 81 mg by mouth daily.    [provider]  atorvastatin (LIPITOR) 10 MG tablet Take 10 mg by mouth at bedtime. 08/21/18   [provider]  azelastine (ASTELIN) 0.1 % nasal spray Place 2 sprays into both nostrils 2 (two) times  daily. Use in each nostril as directed 09/16/15   Saguier, Percell Miller, PA-C  azithromycin (ZITHROMAX) 250 MG tablet Take 250 mg by mouth daily. Take 500 mg day one and 250 mg  By mouth every day for 4 days 08/30/18   [provider]  benzonatate (TESSALON) 200 MG capsule Take 200 mg by mouth 3 (three) times daily as needed for cough.  08/30/18   [provider]  Blood Glucose Monitoring Suppl (ONE TOUCH ULTRA SYSTEM KIT) W/DEVICE KIT 1 kit by Does not apply route once. Dx. 250.00 11/30/12   Carollee Herter, Yvonne R, DO  CINNAMON PO Take 500 mg by mouth 2 (two) times daily.     [provider]  ferrous sulfate 325 (65 FE) MG tablet Take 650 mg by mouth daily with breakfast.     [provider]  fluticasone (FLONASE) 50 MCG/ACT nasal spray SPRAY 2 SPRAYS INTO EACH NOSTRIL EVERY DAY Patient taking differently: Place 2 sprays into both nostrils daily.  11/16/17   Roma Schanz R, DO  fluticasone (FLOVENT HFA) 110 MCG/ACT inhaler Inhale 2 puffs into the lungs 2 (two) times daily. 09/24/15   Saguier, Percell Miller, PA-C  gabapentin (NEURONTIN) 300 MG capsule TAKE 1 CAPSULE BY MOUTH  TWICE DAILY 10/13/18   Carollee Herter, Yvonne R, DO  glucose blood test strip Use as instructed. Pt tests sugars once daily. Dx. 250.00 11/30/12   Carollee Herter, Alferd Apa, DO  hydrochlorothiazide (HYDRODIURIL) 25 MG tablet Take 1 tablet (25 mg total) by mouth daily. Patient not taking: Reported on 05/18/2017 04/28/15   Carollee Herter, Alferd Apa, DO  HYDROcodone-homatropine [facility]) 5-1.5 MG/5ML syrup 5-63m every 6 hours as needed for bad cough 09/01/18   PCharlesetta Shanks MD  hydrOXYzine (ATARAX/VISTARIL) 25 MG tablet Take 1 tablet (25 mg total) by mouth every 8 (eight) hours as needed. Dr. WElvera LennoxPatient not taking: Reported on 09/01/2018 04/15/17   LCarollee Herter YKendrick FriesR, DO  Insulin Syringe-Needle U-100 (RELION INSULIN SYR 0.5ML/31G) 31G X 5/16" 0.5 ML MISC by Does not apply route.    [provider]   Lancets Thin MISC Use as directed. Pt tests sugars once daily. Dx. 250.00 11/30/12   LCarollee Herter YAlferd Apa DO  losartan (COZAAR) 50 MG tablet Take 2 tablets (100 mg total) by mouth daily. 09/01/18   LAnn Held DO  metFORMIN (GLUCOPHAGE) 500 MG tablet Take 500 mg by mouth 2 (two) times daily. 08/21/18   [provider]  montelukast (SINGULAIR) 10 MG tablet Take 1 tablet (10 mg total) by mouth at bedtime. 04/15/17   LAnn Held DO  Multiple Vitamin (MULTIVITAMIN) tablet Take 1 tablet by mouth daily.    [provider]  naproxen sodium (ANAPROX) 220 MG tablet Take 440 mg by mouth daily. For pain    [provider]  pantoprazole (PROTONIX) 40 MG tablet Take 1 tablet (40 mg total) by mouth daily. 09/01/18   LAnn Held  DO  Senna (NATURAL VEGETABLE LAXATIVE) 65-325 MG TABS Take 1 tablet by mouth daily.     [provider]  tiZANidine (ZANAFLEX) 4 MG tablet TAKE 1 TABLET BY MOUTH EVERY 6 (SIX) HOURS AS NEEDED FOR MUSCLE SPASMS. Patient not taking: Reported on 09/01/2018 01/09/16   Carollee Herter, Alferd Apa, DO  vitamin B-12 (CYANOCOBALAMIN) 1000 MCG tablet Take 1,000 mcg by mouth daily.    [provider]  vitamin C (ASCORBIC ACID) 500 MG tablet Take 500 mg by mouth 2 (two) times daily.     [provider]    Family History Family History  Problem Relation Age of Onset  . High blood pressure Father   . Arthritis Mother   . High blood pressure Mother   . Diabetes Mother   . High blood pressure Sister   . Colon cancer Neg Hx   . Esophageal cancer Neg Hx   . Pancreatic cancer Neg Hx   . Rectal cancer Neg Hx   . Stomach cancer Neg Hx     Social History Social History   Tobacco Use  . Smoking status: Never Smoker  . Smokeless tobacco: Never Used  Substance Use Topics  . Alcohol use: No    Alcohol/week: 0.0 standard drinks  . Drug use: No     Allergies   Patient has no known allergies.   Review of  Systems Review of Systems  Constitutional: Positive for chills and fever. Negative for activity change.  HENT: Positive for sinus pressure and sinus pain.   Respiratory: Positive for cough. Negative for shortness of breath.   Cardiovascular: Negative for chest pain, palpitations and leg swelling.  Gastrointestinal: Positive for nausea. Negative for abdominal pain, anal bleeding, blood in stool, constipation, diarrhea and vomiting.  Genitourinary: Positive for dysuria and frequency. Negative for urgency, vaginal bleeding, vaginal discharge and vaginal pain.  Musculoskeletal: Negative for arthralgias, back pain, myalgias, neck pain and neck stiffness.  Skin: Negative for rash.  Allergic/Immunologic: Negative for immunocompromised state.  Neurological: Positive for headaches. Negative for dizziness, syncope, weakness, light-headedness and numbness.  Psychiatric/Behavioral: Negative for confusion.     Physical Exam Updated Vital Signs BP 138/66 (BP Location: Right Wrist)   Pulse (!) 128   Temp 100.2 F (37.9 C) (Rectal)   Resp 20   LMP 08/05/2010   SpO2 97%   Physical Exam Vitals signs and nursing note reviewed.  Constitutional:      General: She is not in acute distress. HENT:     Head: Normocephalic.     Right Ear: A middle ear effusion is present.     Left Ear: A middle ear effusion is present.     Nose: Congestion present.     Right Turbinates: Enlarged.     Left Turbinates: Enlarged.     Right Sinus: Maxillary sinus tenderness present. No frontal sinus tenderness.     Left Sinus: Maxillary sinus tenderness present. No frontal sinus tenderness.     Mouth/Throat:     Mouth: Mucous membranes are moist.     Pharynx: Oropharynx is clear. Uvula midline. No oropharyngeal exudate or posterior oropharyngeal erythema.  Eyes:     General: No scleral icterus.    Extraocular Movements: Extraocular movements intact.     Conjunctiva/sclera: Conjunctivae normal.     Pupils: Pupils are  equal, round, and reactive to light.  Neck:     Musculoskeletal: Normal range of motion and neck supple. No neck rigidity or muscular tenderness.  Vascular: No carotid bruit.  Cardiovascular:     Rate and Rhythm: Regular rhythm. Tachycardia present.     Heart sounds: No murmur. No friction rub. No gallop.   Pulmonary:     Effort: Pulmonary effort is normal. No respiratory distress.     Breath sounds: No stridor. No wheezing, rhonchi or rales.  Chest:     Chest wall: No tenderness.  Abdominal:     General: There is no distension.     Palpations: Abdomen is soft. There is no mass.     Tenderness: There is abdominal tenderness. There is right CVA tenderness. There is no left CVA tenderness, guarding or rebound.     Hernia: No hernia is present.     Comments: Right CVA tenderness.  Abdomen is otherwise soft, nondistended, nontender.  No left CVA tenderness.  Normoactive bowel sounds.  Lymphadenopathy:     Cervical: No cervical adenopathy.  Skin:    General: Skin is warm.     Findings: No rash.  Neurological:     Mental Status: She is alert.  Psychiatric:        Behavior: Behavior normal.      ED Treatments / Results  Labs (all labs ordered are listed, but only abnormal results are displayed) Labs Reviewed  CBC WITH DIFFERENTIAL/PLATELET - Abnormal; Notable for the following components:      Result Value   WBC 10.9 (*)    Hemoglobin 9.5 (*)    HCT 32.3 (*)    MCV 77.6 (*)    MCH 22.8 (*)    MCHC 29.4 (*)    RDW 17.2 (*)    Neutro Abs 9.5 (*)    All other components within normal limits  URINALYSIS, ROUTINE W REFLEX MICROSCOPIC - Abnormal; Notable for the following components:   APPearance CLOUDY (*)    Hgb urine dipstick SMALL (*)    Protein, ur 30 (*)    Leukocytes,Ua LARGE (*)    RBC / HPF >50 (*)    WBC, UA >50 (*)    Bacteria, UA RARE (*)    All other components within normal limits  COMPREHENSIVE METABOLIC PANEL - Abnormal; Notable for the following  components:   Glucose, Bld 160 (*)    All other components within normal limits  CULTURE, BLOOD (ROUTINE X 2)  CULTURE, BLOOD (ROUTINE X 2)  URINE CULTURE  LACTIC ACID, PLASMA    EKG EKG Interpretation  Date/Time:  Wednesday November 01 2018 03:58:37 EDT Ventricular Rate:  134 PR Interval:    QRS Duration: 90 QT Interval:  315 QTC Calculation: 471 R Axis:   -40 Text Interpretation:  Sinus tachycardia Prolonged PR interval Consider left atrial enlargement Left axis deviation Abnormal R-wave progression, late transition Nonspecific T abnormalities, lateral leads No significant change since last tracing Confirmed by Pryor Curia [id number]) on 11/01/2018 4:03:53 AM   Radiology Dg Chest Portable 1 View  Result Date: 11/01/2018 CLINICAL DATA:  Fever and cough EXAM: PORTABLE CHEST 1 VIEW COMPARISON:  09/01/2018 FINDINGS: The heart size and mediastinal contours are within normal limits. Right basilar atelectasis. Ventriculoperitoneal shunt catheter courses along the right chest. The visualized skeletal structures are unremarkable. IMPRESSION: Right basilar opacities, likely atelectasis. Electronically Signed   By: Ulyses Jarred M.D.   On: 11/01/2018 03:35    Procedures Procedures (including critical care time)  Medications Ordered in ED Medications  sodium chloride 0.9 % bolus 1,000 mL (1,000 mLs Intravenous New Bag/Given 11/01/18 0359)  morphine 4 MG/ML injection 4  mg (4 mg Intravenous Given 11/01/18 0359)  cefTRIAXone (ROCEPHIN) 1 g in sodium chloride 0.9 % 100 mL IVPB (1 g Intravenous New Bag/Given 11/01/18 0436)     Initial Impression / Assessment and Plan / ED Course  I have reviewed the triage vital signs and the nursing notes.  Pertinent labs & imaging results that were available during my care of the patient were reviewed by me and considered in my medical decision making (see chart for details).        58 year old female with a history of diabetes mellitus, arthritis, asthma,  CVA, subarachnoid hemorrhage, GERD, and HTN presenting from home with fever, chills, nausea, and urinary complaints.  Patient was markedly hypertensive at 239/138 with heart rate in the 140s on arrival.  Oral temp in route was 101.8 and she was given 1000 g of Tylenol and 4 mg of Zofran.  Rectal temp on arrival at 102.  Heart rate gradually decreased to the 120s.  EKG with no significant changes from previous.  Chest x-ray with right basilar opacities that are likely atelectasis.  On my review, x-ray appears unchanged from previous.  Low suspicion for COVID-19.    UA is concerning for infection.  She was given morphine for pain control and on reevaluation, blood pressure was in the 130s over 60s.  Labs are otherwise notable for leukocytosis of 10.9.  Hemoglobin is minimally decreased at 9.7.  Lactate is normal.  No previous urine cultures are available for comparison.  Urine culture sent.  She was given a dose of Rocephin in the ER and 1 L of IV fluids.  The patient was also endorsing some worsening sinus pain and pressure to the bilateral maxillary sinuses.  Symptoms have been ongoing for 2 days will defer antibiotic treatment at this time.  Given persistent tachycardia, I think it is reasonable to admit the patient for further evaluation since no previous urine cultures are available for comparison.  Suspect persistent tachycardia secondary to fever.  She does meet sepsis criteria, but 30 cc/kg fluid bolus was deferred at this time given that initial lactate was normal.  Consulted the hospitalist team and spoke with Dr. Marlowe Sax who accepted patient for admission. The patient appears reasonably stabilized for admission considering the current resources, flow, and capabilities available in the ED at this time, and I doubt any other [facility] requiring further screening and/or treatment in the ED prior to admission.   Final Clinical Impressions(s) / ED Diagnoses   Final diagnoses:  Pyelonephritis  Acute recurrent  maxillary sinusitis    ED Discharge Orders    None       Joanne Gavel, PA-C 11/01/18 [place], Delice Bison, DO 11/01/18 [id number]

## 2018-11-01 NOTE — Care Management Obs Status (Signed)
[facility] NOTIFICATION   Patient Details  Name: Carla Jensen MRN: [id number] Date of Birth: February 13, 1961   Medicare Observation Status Notification Given:  Yes    MahabirJuliann Pulse, RN 11/01/2018, 12:56 PM

## 2018-11-01 NOTE — H&P (Signed)
History and Physical    Carla Jensen DOB: Nov 24, 1960 DOA: 11/01/2018  PCP: Tsosie Billing, MD Patient coming from: Home  Chief Complaint: Chills, UTI symptoms  HPI: Carla Jensen is a 58 y.o. 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  Patient states for the past 3 to 4 days she has been having a feeling of bladder fullness, urinary frequency, urgency, and dysuria.  Last night she developed chills and started feeling nauseous.  She has not vomited.  Reports right-sided flank pain.  Denies any abdominal pain.  States she has chronic allergies and for the past 1 week experiencing bilateral frontal and maxillary sinus pain and pressure.  She has been sneezing.  She is having a small amount of clear to yellowish nasal discharge.  Denies any cough or shortness of breath.  Denies any sick contacts or exposure to any individual with confirmed COVID-19.  Review of Systems: As per HPI otherwise 10 point review of systems negative.  Past Medical History:  Diagnosis Date  . Allergy   . Anemia   . Arthritis   . Asthma   . Chickenpox   . Depression   . Diabetes mellitus without complication ([place])   . GERD (gastroesophageal reflux disease)   . Hypertension   . Stroke ([place])   . Subarachnoid hemorrhage ([place]) 3/11   with obstructive hydrocephalus--Dr.Jenkins    Past Surgical History:  Procedure Laterality Date  . ACHILLES TENDON REPAIR Left 1/08   Dr.Norris  . ACHILLES TENDON REPAIR Right 7/09   Dr.Norris  . ELBOW SURGERY    . KNEE ARTHROSCOPY Left 5/06  . TENDON REPAIR Right 3/07   Elbow     reports that she has never smoked. She has never used smokeless tobacco. She reports that she does not drink alcohol or use  drugs.  No Known Allergies  Family History  Problem Relation Age of Onset  . High blood pressure Father   . Arthritis Mother   . High blood pressure Mother   . Diabetes Mother   . High blood pressure Sister   . Colon cancer Neg Hx   . Esophageal cancer Neg Hx   . Pancreatic cancer Neg Hx   . Rectal cancer Neg Hx   . Stomach cancer Neg Hx     Prior to Admission medications   Medication Sig Start Date End Date Taking? Authorizing Provider  albuterol (PROVENTIL HFA;VENTOLIN HFA) 108 (90 Base) MCG/ACT inhaler Inhale 2 puffs into the lungs every 6 (six) hours as needed for wheezing or shortness of breath.    [provider]  amLODipine (NORVASC) 5 MG tablet Take 1 tablet (5 mg total) by mouth daily. Need ov before any more refills Patient taking differently: Take 5 mg by mouth daily.  08/28/18   Ann Held, DO  amoxicillin (AMOXIL) 500 MG tablet Take 1,000 mg by mouth 3 (three) times daily. 08/30/18   [provider]  aspirin 81 MG tablet Take 81 mg by mouth daily.    [provider]  atorvastatin (LIPITOR) 10 MG tablet Take 10 mg by mouth at bedtime. 08/21/18   [provider]  azelastine (ASTELIN) 0.1 % nasal spray Place 2 sprays into both nostrils 2 (two) times daily. Use in each nostril as directed 09/16/15  Saguier, Percell Miller, PA-C  azithromycin (ZITHROMAX) 250 MG tablet Take 250 mg by mouth daily. Take 500 mg day one and 250 mg  By mouth every day for 4 days 08/30/18   [provider]  benzonatate (TESSALON) 200 MG capsule Take 200 mg by mouth 3 (three) times daily as needed for cough.  08/30/18   [provider]  Blood Glucose Monitoring Suppl (ONE TOUCH ULTRA SYSTEM KIT) W/DEVICE KIT 1 kit by Does not apply route once. Dx. 250.00 11/30/12   Carollee Herter, Yvonne R, DO  CINNAMON PO Take 500 mg by mouth 2 (two) times daily.     [provider]  ferrous sulfate 325 (65 FE) MG tablet Take 650 mg by mouth daily with breakfast.      [provider]  fluticasone (FLONASE) 50 MCG/ACT nasal spray SPRAY 2 SPRAYS INTO EACH NOSTRIL EVERY DAY Patient taking differently: Place 2 sprays into both nostrils daily.  11/16/17   Roma Schanz R, DO  fluticasone (FLOVENT HFA) 110 MCG/ACT inhaler Inhale 2 puffs into the lungs 2 (two) times daily. 09/24/15   Saguier, Percell Miller, PA-C  gabapentin (NEURONTIN) 300 MG capsule TAKE 1 CAPSULE BY MOUTH  TWICE DAILY 10/13/18   Carollee Herter, Yvonne R, DO  glucose blood test strip Use as instructed. Pt tests sugars once daily. Dx. 250.00 11/30/12   Carollee Herter, Alferd Apa, DO  hydrochlorothiazide (HYDRODIURIL) 25 MG tablet Take 1 tablet (25 mg total) by mouth daily. Patient not taking: Reported on 05/18/2017 04/28/15   Carollee Herter, Alferd Apa, DO  HYDROcodone-homatropine [facility]) 5-1.5 MG/5ML syrup 5-32m every 6 hours as needed for bad cough 09/01/18   PCharlesetta Shanks MD  hydrOXYzine (ATARAX/VISTARIL) 25 MG tablet Take 1 tablet (25 mg total) by mouth every 8 (eight) hours as needed. Dr. WElvera LennoxPatient not taking: Reported on 09/01/2018 04/15/17   LCarollee Herter YKendrick FriesR, DO  Insulin Syringe-Needle U-100 (RELION INSULIN SYR 0.5ML/31G) 31G X 5/16" 0.5 ML MISC by Does not apply route.    [provider]  Lancets Thin MISC Use as directed. Pt tests sugars once daily. Dx. 250.00 11/30/12   LCarollee Herter YAlferd Apa DO  losartan (COZAAR) 50 MG tablet Take 2 tablets (100 mg total) by mouth daily. 09/01/18   LAnn Held DO  metFORMIN (GLUCOPHAGE) 500 MG tablet Take 500 mg by mouth 2 (two) times daily. 08/21/18   [provider]  montelukast (SINGULAIR) 10 MG tablet Take 1 tablet (10 mg total) by mouth at bedtime. 04/15/17   LAnn Held DO  Multiple Vitamin (MULTIVITAMIN) tablet Take 1 tablet by mouth daily.    [provider]  naproxen sodium (ANAPROX) 220 MG tablet Take 440 mg by mouth daily. For pain    [provider]  pantoprazole (PROTONIX) 40 MG  tablet Take 1 tablet (40 mg total) by mouth daily. 09/01/18   LAnn Held DO  Senna (NATURAL VEGETABLE LAXATIVE) 65-325 MG TABS Take 1 tablet by mouth daily.     [provider]  tiZANidine (ZANAFLEX) 4 MG tablet TAKE 1 TABLET BY MOUTH EVERY 6 (SIX) HOURS AS NEEDED FOR MUSCLE SPASMS. Patient not taking: Reported on 09/01/2018 01/09/16   LCarollee Herter YAlferd Apa DO  vitamin B-12 (CYANOCOBALAMIN) 1000 MCG tablet Take 1,000 mcg by mouth daily.    [provider]  vitamin C (ASCORBIC ACID) 500 MG tablet Take 500 mg by mouth 2 (two) times daily.     [provider]  Physical Exam: Vitals:   11/01/18 0438 11/01/18 0512 11/01/18 0530 11/01/18 0600  BP: 138/66  133/66 (!) 139/91  Pulse: (!) 128  (!) 110 (!) 120  Resp: 20  18 (!) 21  Temp:  100.2 F (37.9 C)    TempSrc:  Rectal    SpO2: 97%  97% 99%    Physical Exam  Constitutional: She is oriented to person, place, and time. She appears well-developed and well-nourished. No distress.  HENT:  Head: Normocephalic.  Mouth/Throat: Oropharynx is clear and moist.  Eyes: Pupils are equal, round, and reactive to light. EOM are normal.  Neck: Neck supple.  Cardiovascular: Normal rate, regular rhythm and intact distal pulses.  Pulmonary/Chest: Effort normal and breath sounds normal. No respiratory distress. She has no wheezes. She has no rales.  Abdominal: Soft. Bowel sounds are normal. She exhibits no distension. There is no abdominal tenderness. There is no rebound and no guarding.  Musculoskeletal:        General: No edema.     Comments: No CVA tenderness at present (received morphine)  Neurological: She is alert and oriented to person, place, and time. No cranial nerve deficit.  Strength 5 out of 5 in bilateral upper and lower extremities. Sensation to light touch intact throughout.  Skin: Skin is warm and dry. She is not diaphoretic.     Labs on Admission: I have personally reviewed following labs and  imaging studies  CBC: Recent Labs  Lab 11/01/18 0347  WBC 10.9*  NEUTROABS 9.5*  HGB 9.5*  HCT 32.3*  MCV 77.6*  PLT 324   Basic Metabolic Panel: Recent Labs  Lab 11/01/18 0347  NA 138  K 3.6  CL 107  CO2 22  GLUCOSE 160*  BUN 15  CREATININE 0.85  CALCIUM 9.1   GFR: CrCl cannot be calculated (Unknown ideal weight.). Liver Function Tests: Recent Labs  Lab 11/01/18 0347  AST 17  ALT 18  ALKPHOS 89  BILITOT 0.5  PROT 7.8  ALBUMIN 3.7   No results for input(s): LIPASE, AMYLASE in the last 168 hours. No results for input(s): AMMONIA in the last 168 hours. Coagulation Profile: No results for input(s): INR, PROTIME in the last 168 hours. Cardiac Enzymes: No results for input(s): CKTOTAL, CKMB, CKMBINDEX, TROPONINI in the last 168 hours. BNP (last 3 results) No results for input(s): PROBNP in the last 8760 hours. HbA1C: No results for input(s): HGBA1C in the last 72 hours. CBG: No results for input(s): GLUCAP in the last 168 hours. Lipid Profile: No results for input(s): CHOL, HDL, LDLCALC, TRIG, CHOLHDL, LDLDIRECT in the last 72 hours. Thyroid Function Tests: No results for input(s): TSH, T4TOTAL, FREET4, T3FREE, THYROIDAB in the last 72 hours. Anemia Panel: No results for input(s): VITAMINB12, FOLATE, FERRITIN, TIBC, IRON, RETICCTPCT in the last 72 hours. Urine analysis:    Component Value Date/Time   COLORURINE YELLOW 11/01/2018 0305   APPEARANCEUR CLOUDY (A) 11/01/2018 0305   LABSPEC 1.009 11/01/2018 0305   PHURINE 6.0 11/01/2018 0305   GLUCOSEU NEGATIVE 11/01/2018 0305   HGBUR SMALL (A) 11/01/2018 0305   BILIRUBINUR NEGATIVE 11/01/2018 0305   BILIRUBINUR neg 04/15/2017 1034   KETONESUR NEGATIVE 11/01/2018 0305   PROTEINUR 30 (A) 11/01/2018 0305   UROBILINOGEN 0.2 04/15/2017 1034   UROBILINOGEN 1.0 10/04/2009 1754   NITRITE NEGATIVE 11/01/2018 0305   LEUKOCYTESUR LARGE (A) 11/01/2018 0305    Radiological Exams on Admission: Dg Chest Portable 1  View  Result Date: 11/01/2018 CLINICAL DATA:  Fever and  cough EXAM: PORTABLE CHEST 1 VIEW COMPARISON:  09/01/2018 FINDINGS: The heart size and mediastinal contours are within normal limits. Right basilar atelectasis. Ventriculoperitoneal shunt catheter courses along the right chest. The visualized skeletal structures are unremarkable. IMPRESSION: Right basilar opacities, likely atelectasis. Electronically Signed   By: Ulyses Jarred M.D.   On: 11/01/2018 03:35    EKG: Independently reviewed.  Sinus tachycardia (heart rate 134).  Rate increased since prior tracing.  Assessment/Plan Principal Problem:   Pyelonephritis Active Problems:   Sepsis ([place])   Acute sinusitis   Chronic anemia   Asthma   Sepsis secondary to pyelonephritis Temperature 101.8 per EMS and continues to be febrile in the ED.  Tachycardic.  Not hypotensive.  White count 10.9.  Lactic acid normal.  UA showing large amount of leukocytes, greater than 50 RBCs, greater than 50 WBCs, and rare bacteria.  Patient had right-sided CVA tenderness on exam done by ED provider. -Continue ceftriaxone -IV fluid hydration -IV morphine 1 mg every 3 hours as needed for flank pain -IV Zofran PRN nausea -Tylenol PRN -Urine culture pending -Continue to monitor CBC -Blood culture x2 -Monitor urine output  Acute sinusitis Patient reports 1 week history of bilateral frontal and maxillary sinus pain and pressure.  Reports history of chronic allergies. -Symptomatic management: Flonase nasal spray, Claritin, Tylenol PRN -Ceftriaxone for pyelonephritis as mentioned above  Chronic microcytic anemia Hemoglobin 9.5, was 10.5 a year ago.  No recent labs. -Check iron, ferritin, TIBC -Continue to monitor CBC  Asthma -Stable.  No bronchospasm.  Albuterol inhaler as needed.  Type 2 diabetes -Check A1c.  Sliding scale insulin sensitive and CBG checks.  DVT prophylaxis: Lovenox Code Status: Full code Family Communication: No family  available. Disposition Plan: Anticipate discharge after clinical improvement. Consults called: None Admission status: Observation, telemetry  This chart was dictated using voice recognition software.  Despite best efforts to proofread, errors can occur which can change the documentation meaning.  Shela Leff MD [facility] Pager [phone]  If 7PM-7AM, please contact night-coverage [email or web address] Password [place]  11/01/2018, 6:34 AM

## 2018-11-01 NOTE — ED Notes (Signed)
Pt ambulated to the restroom without assistance. She could not wait for vitals, will obtain vitals after she returns. EMS vitals stable.

## 2018-11-02 DIAGNOSIS — J0100 Acute maxillary sinusitis, unspecified: Secondary | ICD-10-CM | POA: Diagnosis not present

## 2018-11-02 DIAGNOSIS — N12 Tubulo-interstitial nephritis, not specified as acute or chronic: Secondary | ICD-10-CM | POA: Diagnosis not present

## 2018-11-02 DIAGNOSIS — J0101 Acute recurrent maxillary sinusitis: Secondary | ICD-10-CM | POA: Diagnosis not present

## 2018-11-02 DIAGNOSIS — J4520 Mild intermittent asthma, uncomplicated: Secondary | ICD-10-CM | POA: Diagnosis not present

## 2018-11-02 LAB — BASIC METABOLIC PANEL
Anion gap: 5 (ref 5–15)
BUN: 12 mg/dL (ref 6–20)
CO2: 24 mmol/L (ref 22–32)
Calcium: 8.8 mg/dL — ABNORMAL LOW (ref 8.9–10.3)
Chloride: 111 mmol/L (ref 98–111)
Creatinine, Ser: 0.85 mg/dL (ref 0.44–1.00)
GFR calc Af Amer: >60 mL/min (ref >60)
GFR calc non Af Amer: >60 mL/min (ref >60)
Glucose, Bld: 114 mg/dL — ABNORMAL HIGH (ref 70–99)
Potassium: 3.8 mmol/L (ref 3.5–5.1)
Sodium: 140 mmol/L (ref 135–145)

## 2018-11-02 LAB — CBC
HCT: 29.2 % — ABNORMAL LOW (ref 36.0–46.0)
Hemoglobin: 8.5 g/dL — ABNORMAL LOW (ref 12.0–15.0)
MCH: 22.8 pg — ABNORMAL LOW (ref 26.0–34.0)
MCHC: 29.1 g/dL — ABNORMAL LOW (ref 30.0–36.0)
MCV: 78.3 fL — ABNORMAL LOW (ref 80.0–100.0)
Platelets: 304 10*3/uL (ref 150–400)
RBC: 3.73 MIL/uL — ABNORMAL LOW (ref 3.87–5.11)
RDW: 17.2 % — ABNORMAL HIGH (ref 11.5–15.5)
WBC: 7.4 10*3/uL (ref 4.0–10.5)
nRBC: 0 % (ref 0.0–0.2)

## 2018-11-02 LAB — GLUCOSE, CAPILLARY
Glucose-Capillary: 96 mg/dL (ref 70–99)
Glucose-Capillary: 120 mg/dL — ABNORMAL HIGH (ref 70–99)
Glucose-Capillary: 104 mg/dL — ABNORMAL HIGH (ref 70–99)

## 2018-11-02 MED ORDER — AMLODIPINE BESYLATE 5 MG PO TABS
5.0000 mg | ORAL_TABLET | Freq: Every day | ORAL | Status: DC
  Administered 2018-11-02: 5 mg via ORAL
  Filled 2018-11-02: qty 1

## 2018-11-02 MED ORDER — SENNA 8.6 MG PO TABS
1.0000 | ORAL_TABLET | Freq: Every day | ORAL | Status: DC | PRN
  Administered 2018-11-02: 8.6 mg via ORAL
  Filled 2018-11-02: qty 1

## 2018-11-02 MED ORDER — GABAPENTIN 300 MG PO CAPS
300.0000 mg | ORAL_CAPSULE | Freq: Two times a day (BID) | ORAL | Status: DC
  Administered 2018-11-02 – 2018-11-03 (×2): 300 mg via ORAL
  Filled 2018-11-02 (×2): qty 1

## 2018-11-02 NOTE — Progress Notes (Signed)
[facility]                                                                              Patient Demographics  Carla Jensen, is a 58 y.o. female, DOB - September 08, 1960, [id number]  Admit date - 11/01/2018   Admitting Physician Shela Leff, MD  Outpatient Primary MD for the patient is Tsosie Billing, MD  Outpatient specialists:   LOS - 0  days   Medical records reviewed and are as summarized below:    Chief Complaint  Patient presents with  . Possible UTI       Brief summary   Patient is a 58 year old female with history of anemia, arthritis, asthma, depression, diabetes mellitus type 2, GERD, hypertension, CVA, history of SAH with obstructive hydrocephalus status post VP shunt in 2011 presented with fever chills and UTI symptoms.  Patient reported feeling of bladder fullness, urinary frequency, urgency and dysuria for last 3 to 4 days.  Also reported right-sided flank pain with fevers and chills. No shortness of breath, denies any sick contacts or exposure to any individuals with confirmed COVID-19. UA was positive for UTI.  Patient was admitted for right-sided pyelonephritis   Assessment & Plan    Principal Problem: Sepsis secondary to right pyelonephritis and E. coli UTI -Still has mild flank pain, however symptoms of dysuria improving - patient met sepsis criteria in ED with the tachycardia, leukocytosis, UTI.  Afebrile -Continue IV Rocephin. -Blood cultures so far negative, urine culture shows E. coli, sensitivities pending  Active Problems:    Acute sinusitis -Patient reported 1 week history of bilateral frontal and maxillary sinus pain and pressure.  Has history of chronic allergies -Continue Flonase nasal spray, Claritin, Tylenol as needed, continue antibiotics    Chronic microcytic anemia -Anemia panel shows iron deficiency anemia -Patient follows GI, had last colonoscopy in 04/2017 showed internal hemorrhoids, small diverticuli  and polyps -Restart iron replacement    Asthma -Currently stable, no wheezing -Continue Singulair, albuterol inhaler as needed  Essential hypertension Continue losartan, BP still elevated  Continue hydralazine IV as needed with parameters Added amlodipine 5 mg daily  Code Status: Full CODE STATUS DVT Prophylaxis:  Lovenox  Family Communication: Discussed in detail with the patient, all imaging results, lab results explained to the patient    Disposition Plan: Awaiting urine culture and sensitivities for disposition  Time Spent in minutes 35 minutes  Procedures:  None  Consultants:   None  Antimicrobials:   Anti-infectives (From admission, onward)   Start     Dose/Rate Route Frequency Ordered Stop   11/02/18 0600  cefTRIAXone (ROCEPHIN) 2 g in sodium chloride 0.9 % 100 mL IVPB     2 g 200 mL/hr over 30 Minutes Intravenous Every 24 hours 11/01/18 0613     11/01/18 0615  cefTRIAXone (ROCEPHIN) 1 g in sodium chloride 0.9 % 100 mL IVPB     1 g 200 mL/hr over 30 Minutes Intravenous  Once 11/01/18 0613 11/01/18 1033   11/01/18 0415  cefTRIAXone (ROCEPHIN) 1 g in sodium chloride 0.9 % 100 mL IVPB     1 g 200 mL/hr over 30 Minutes  Intravenous  Once 11/01/18 0403 11/01/18 0545         Medications  Scheduled Meds: . aspirin  81 mg Oral Daily  . atorvastatin  10 mg Oral QHS  . enoxaparin (LOVENOX) injection  40 mg Subcutaneous Q24H  . ferrous sulfate  325 mg Oral BID WC  . fluticasone  2 spray Each Nare Daily  . insulin aspart  0-9 Units Subcutaneous TID WC  . loratadine  10 mg Oral Daily  . losartan  100 mg Oral Daily  . montelukast  10 mg Oral QHS  . pantoprazole  40 mg Oral Daily  . vitamin B-12  1,000 mcg Oral Daily  . vitamin C  500 mg Oral BID   Continuous Infusions: . cefTRIAXone (ROCEPHIN)  IV 2 g (11/02/18 0602)   PRN Meds:.acetaminophen **OR** acetaminophen, albuterol, hydrALAZINE, morphine injection, ondansetron (ZOFRAN) IV      Subjective:    Carla Jensen was seen and examined today.  Feeling somewhat better today, afebrile, states dysuria improving however still has right flank pain.  No nausea or vomiting. Patient denies dizziness, chest pain, shortness of breath, abdominal pain, N/V/D/C, new weakness, numbess, tingling.   Objective:   Vitals:   11/01/18 0716 11/01/18 1413 11/01/18 2124 11/02/18 0536  BP: (!) 156/81 (!) 150/64 (!) 157/53 (!) 152/68  Pulse: (!) 104 71 77 80  Resp: 16 16 (!) 22 16  Temp: 99.2 F (37.3 C) 98.4 F (36.9 C) 98.9 F (37.2 C) 98.8 F (37.1 C)  TempSrc: Oral Oral Oral Oral  SpO2: 97% 100% 100% 99%  Weight:      Height:        Intake/Output Summary (Last 24 hours) at 11/02/2018 1158 Last data filed at 11/02/2018 0530 Gross per 24 hour  Intake 240 ml  Output 800 ml  Net -560 ml     Wt Readings from Last 3 Encounters:  11/01/18 117.4 kg  09/01/18 122.5 kg  05/18/17 123.4 kg   Physical Exam  General: Alert and oriented x 3, NAD  Eyes:   HEENT:    Cardiovascular: S1 S2 clear, RRR. No pedal edema b/l  Respiratory: CTAB, no wheezing, rales or rhonchi  Gastrointestinal: Soft, nontender, nondistended, NBS, mild right CVAT  Ext: no pedal edema bilaterally  Neuro: no new deficits  Musculoskeletal: No cyanosis, clubbing  Skin: No rashes  Psych: Normal affect and demeanor, alert and oriented x3     Data Reviewed:  I have personally reviewed following labs and imaging studies  Micro Results Recent Results (from the past 240 hour(s))  Urine culture     Status: Abnormal (Preliminary result)   Collection Time: 11/01/18  3:05 AM  Result Value Ref Range Status   Specimen Description   Final    URINE, RANDOM Performed at [place] [address] [place], [place] [address]    Special Requests   Final    NONE Performed at [facility], [place] [address], [place], [place] [address]    Culture (A)  Final    >=100,000 COLONIES/mL  ESCHERICHIA COLI 40,000 COLONIES/mL GROUP B STREP(S.AGALACTIAE)ISOLATED TESTING AGAINST S. AGALACTIAE NOT ROUTINELY PERFORMED DUE TO PREDICTABILITY OF AMP/PEN/VAN SUSCEPTIBILITY. Performed at [facility], [place] [address] [place], [place] [address]    Report Status PENDING  Incomplete    Radiology Reports Dg Chest Portable 1 View  Result Date: 11/01/2018 CLINICAL DATA:  Fever and cough EXAM: PORTABLE CHEST 1 VIEW COMPARISON:  09/01/2018 FINDINGS: The heart size  and mediastinal contours are within normal limits. Right basilar atelectasis. Ventriculoperitoneal shunt catheter courses along the right chest. The visualized skeletal structures are unremarkable. IMPRESSION: Right basilar opacities, likely atelectasis. Electronically Signed   By: Ulyses Jarred M.D.   On: 11/01/2018 03:35    Lab Data:  CBC: Recent Labs  Lab 11/01/18 0347 11/02/18 0515  WBC 10.9* 7.4  NEUTROABS 9.5*  --   HGB 9.5* 8.5*  HCT 32.3* 29.2*  MCV 77.6* 78.3*  PLT 341 550   Basic Metabolic Panel: Recent Labs  Lab 11/01/18 0347 11/02/18 0515  NA 138 140  K 3.6 3.8  CL 107 111  CO2 22 24  GLUCOSE 160* 114*  BUN 15 12  CREATININE 0.85 0.85  CALCIUM 9.1 8.8*   GFR: Estimated Creatinine Clearance: 88.7 mL/min (by C-G formula based on SCr of 0.85 mg/dL). Liver Function Tests: Recent Labs  Lab 11/01/18 0347  AST 17  ALT 18  ALKPHOS 89  BILITOT 0.5  PROT 7.8  ALBUMIN 3.7   No results for input(s): LIPASE, AMYLASE in the last 168 hours. No results for input(s): AMMONIA in the last 168 hours. Coagulation Profile: No results for input(s): INR, PROTIME in the last 168 hours. Cardiac Enzymes: No results for input(s): CKTOTAL, CKMB, CKMBINDEX, TROPONINI in the last 168 hours. BNP (last 3 results) No results for input(s): PROBNP in the last 8760 hours. HbA1C: Recent Labs    11/01/18 0647  HGBA1C 6.7*   CBG: Recent Labs  Lab 11/01/18 1133 11/01/18 1735 11/01/18 2122 11/02/18 0754  11/02/18 1138  GLUCAP 120* 108* 116* 96 120*   Lipid Profile: No results for input(s): CHOL, HDL, LDLCALC, TRIG, CHOLHDL, LDLDIRECT in the last 72 hours. Thyroid Function Tests: No results for input(s): TSH, T4TOTAL, FREET4, T3FREE, THYROIDAB in the last 72 hours. Anemia Panel: Recent Labs    11/01/18 0647  FERRITIN 38  TIBC 263  IRON 8*   Urine analysis:    Component Value Date/Time   COLORURINE YELLOW 11/01/2018 0305   APPEARANCEUR CLOUDY (A) 11/01/2018 0305   LABSPEC 1.009 11/01/2018 0305   PHURINE 6.0 11/01/2018 0305   GLUCOSEU NEGATIVE 11/01/2018 0305   HGBUR SMALL (A) 11/01/2018 0305   BILIRUBINUR NEGATIVE 11/01/2018 0305   BILIRUBINUR neg 04/15/2017 1034   KETONESUR NEGATIVE 11/01/2018 0305   PROTEINUR 30 (A) 11/01/2018 0305   UROBILINOGEN 0.2 04/15/2017 1034   UROBILINOGEN 1.0 10/04/2009 1754   NITRITE NEGATIVE 11/01/2018 0305   LEUKOCYTESUR LARGE (A) 11/01/2018 0305     Ripudeep Rai M.D. [facility] 11/02/2018, 11:58 AM  Pager: [phone] Between 7am to 7pm - call Pager - [phone]  After 7pm go to [email or web address] - password [email or web address]  Call night coverage person covering after 7pm

## 2018-11-02 NOTE — Progress Notes (Signed)
Patient with elevated BP in the 170s. PRN hydralazine given, minutes later after hydralazine was given patient got up to the bathroom heart rate elevated in the 140s nonsustained and patient also C/O nausea and dizziness, Zofran given, will continue to assess patient.

## 2018-11-03 DIAGNOSIS — J0100 Acute maxillary sinusitis, unspecified: Secondary | ICD-10-CM | POA: Diagnosis not present

## 2018-11-03 DIAGNOSIS — N12 Tubulo-interstitial nephritis, not specified as acute or chronic: Secondary | ICD-10-CM | POA: Diagnosis not present

## 2018-11-03 DIAGNOSIS — J0101 Acute recurrent maxillary sinusitis: Secondary | ICD-10-CM | POA: Diagnosis not present

## 2018-11-03 LAB — BASIC METABOLIC PANEL
Anion gap: 8 (ref 5–15)
BUN: 13 mg/dL (ref 6–20)
CO2: 24 mmol/L (ref 22–32)
Calcium: 9.2 mg/dL (ref 8.9–10.3)
Chloride: 109 mmol/L (ref 98–111)
Creatinine, Ser: 0.86 mg/dL (ref 0.44–1.00)
GFR calc Af Amer: >60 mL/min (ref >60)
GFR calc non Af Amer: >60 mL/min (ref >60)
Glucose, Bld: 125 mg/dL — ABNORMAL HIGH (ref 70–99)
Potassium: 3.8 mmol/L (ref 3.5–5.1)
Sodium: 141 mmol/L (ref 135–145)

## 2018-11-03 LAB — URINE CULTURE: Culture: >=100000 — AB

## 2018-11-03 LAB — GLUCOSE, CAPILLARY: Glucose-Capillary: 106 mg/dL — ABNORMAL HIGH (ref 70–99)

## 2018-11-03 MED ORDER — LORATADINE 10 MG PO TABS: 10.0000 mg | ORAL_TABLET | Freq: Every day | ORAL | 1 refills | Status: AC

## 2018-11-03 MED ORDER — AMLODIPINE BESYLATE 10 MG PO TABS: 10.0000 mg | ORAL_TABLET | Freq: Every day | ORAL | 1 refills | Status: AC

## 2018-11-03 MED ORDER — AMLODIPINE BESYLATE 10 MG PO TABS
10.0000 mg | ORAL_TABLET | Freq: Every day | ORAL | Status: DC
  Administered 2018-11-03: 10 mg via ORAL
  Filled 2018-11-03: qty 1

## 2018-11-03 MED ORDER — NAPROXEN SODIUM 220 MG PO TABS: 440.0000 mg | ORAL_TABLET | Freq: Every day | ORAL | Status: AC | PRN

## 2018-11-03 MED ORDER — FLUTICASONE PROPIONATE 50 MCG/ACT NA SUSP: 2.0000 | Freq: Every day | NASAL | 2 refills | Status: AC

## 2018-11-03 MED ORDER — CIPROFLOXACIN HCL 500 MG PO TABS: 500.0000 mg | ORAL_TABLET | Freq: Two times a day (BID) | ORAL | 0 refills | Status: AC

## 2018-11-03 MED ORDER — TRAMADOL HCL 50 MG PO TABS: 50.0000 mg | ORAL_TABLET | Freq: Two times a day (BID) | ORAL | 0 refills | Status: AC | PRN

## 2018-11-03 NOTE — Progress Notes (Signed)
Patient's BP in the 170s, she refused the PRN hydralazine because she did not like the way it made her feel last night.

## 2018-11-03 NOTE — Discharge Summary (Addendum)
Physician Discharge Summary   Patient ID: Carla Jensen MRN: [id number] DOB/AGE: 1960-07-15 58 y.o.  Admit date: 11/01/2018 Discharge date: 11/03/2018  Primary Care Physician:  Tsosie Billing, MD   Recommendations for Outpatient Follow-up:  1. Follow up with PCP in 1-2 weeks by telehealth visit 2. Patient restarted on Norvasc 10 mg daily, please titrate BP medications  Home Health: None  Equipment/Devices: None  Discharge Condition: stable  CODE STATUS: FULL  Diet recommendation: Carb modified diet   Discharge Diagnoses:    . Sepsis secondary to right pyelonephritis  E. coli UTI Acute maxillary sinusitis Chronic microcytic anemia Asthma Accelerated hypertension   Consults: None    Allergies:  No Known Allergies   DISCHARGE MEDICATIONS: Allergies as of 11/03/2018   No Known Allergies     Medication List    TAKE these medications   albuterol 108 (90 Base) MCG/ACT inhaler Commonly known as:  VENTOLIN HFA Inhale 2 puffs into the lungs every 6 (six) hours as needed for wheezing or shortness of breath.   amLODipine 10 MG tablet Commonly known as:  NORVASC Take 1 tablet (10 mg total) by mouth daily. What changed:    medication strength  how much to take  additional instructions   aspirin 81 MG tablet Take 81 mg by mouth daily.   atorvastatin 10 MG tablet Commonly known as:  LIPITOR Take 10 mg by mouth at bedtime.   CINNAMON PO Take 500 mg by mouth 2 (two) times daily.   ciprofloxacin 500 MG tablet Commonly known as:  CIPRO Take 1 tablet (500 mg total) by mouth 2 (two) times daily for 10 days. Start taking on:  November 04, 2018   ferrous sulfate 325 (65 FE) MG tablet Take 325 mg by mouth 2 (two) times daily with a meal.   fluticasone 50 MCG/ACT nasal spray Commonly known as:  FLONASE Place 2 sprays into both nostrils daily. What changed:  See the new instructions.   gabapentin 300 MG capsule Commonly known as:  NEURONTIN TAKE 1  CAPSULE BY MOUTH  TWICE DAILY   glucose blood test strip Use as instructed. Pt tests sugars once daily. Dx. 250.00   Lancets Thin Misc Use as directed. Pt tests sugars once daily. Dx. 250.00   loratadine 10 MG tablet Commonly known as:  CLARITIN Take 1 tablet (10 mg total) by mouth daily.   losartan 100 MG tablet Commonly known as:  COZAAR Take 100 mg by mouth daily.   Melatonin 10 MG Tabs Take 10 mg by mouth daily as needed (sleep).   metFORMIN 500 MG tablet Commonly known as:  GLUCOPHAGE Take 500 mg by mouth 2 (two) times daily.   montelukast 10 MG tablet Commonly known as:  Singulair Take 1 tablet (10 mg total) by mouth at bedtime.   naproxen sodium 220 MG tablet Commonly known as:  ALEVE Take 2 tablets (440 mg total) by mouth daily as needed. For pain What changed:    when to take this  reasons to take this   Natural Vegetable Laxative 65-325 MG Tabs Take 1 tablet by mouth daily.   ONE TOUCH ULTRA SYSTEM KIT w/Device Kit 1 kit by Does not apply route once. Dx. 250.00   pantoprazole 40 MG tablet Commonly known as:  PROTONIX Take 1 tablet (40 mg total) by mouth daily.   RELION INSULIN SYR 0.5ML/31G 31G X 5/16" 0.5 ML Misc Generic drug:  Insulin Syringe-Needle U-100 by Does not apply route.   traMADol 50 MG  tablet Commonly known as:  Ultram Take 1 tablet (50 mg total) by mouth every 12 (twelve) hours as needed for severe pain.   vitamin B-12 1000 MCG tablet Commonly known as:  CYANOCOBALAMIN Take 1,000 mcg by mouth daily.   vitamin C 500 MG tablet Commonly known as:  ASCORBIC ACID Take 500 mg by mouth 2 (two) times daily.        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  Patient reported feeling of bladder fullness, urinary frequency, urgency and dysuria for last 3 to 4 days.  Also reported right-sided flank pain with fevers and chills. No shortness of breath, denies any sick contacts or exposure to any individuals with confirmed COVID-19. UA was positive for UTI.  Patient was admitted for right-sided pyelonephritis    Hospital Course:   Sepsis secondary to right pyelonephritis and E. coli UTI -Much improved,  patient met sepsis criteria in ED with the tachycardia, leukocytosis, UTI.  Afebrile -Patient was placed on IV Rocephin, transition to oral ciprofloxacin per sensitivities for 10 days.   -Blood cultures negative, urine culture showed E. coli.      Acute maxillary sinusitis -Patient reported 1 week history of bilateral frontal and maxillary sinus pain and pressure.  Has history of chronic allergies -Continue Flonase nasal spray, Claritin, Singulair    Chronic microcytic anemia -Anemia panel shows iron deficiency anemia -Patient follows GI, had last colonoscopy in 04/2017 showed internal hemorrhoids, small diverticuli and polyps -Restart iron replacement    Asthma -Currently stable, no wheezing -Continue Singulair, albuterol inhaler as needed  Accelerated hypertension Continue losartan, BP was still elevated.  Norvasc was added and titrated up to 10 mg daily Patient recommended to check her BP daily and will also follow-up with her PCP for adjustment of her antihypertensives  Day of Discharge S: Feeling a lot better, afebrile, wants to go home  BP (!) 174/74 (BP Location: Left Wrist)   Pulse 95   Temp 99.2 F (37.3 C) (Oral)   Resp 18   Ht _0  (1.626 m)   Wt 117.4 kg   LMP 08/05/2010   SpO2 99%   BMI 44.42 kg/m   Physical Exam: General: Alert and awake oriented x3 not in any acute distress. HEENT: anicteric sclera, pupils reactive to light and accommodation CVS: S1-S2 clear no murmur rubs or  gallops Chest: clear to auscultation bilaterally, no wheezing rales or rhonchi Abdomen: soft nontender, nondistended, normal bowel sounds Extremities: no cyanosis, clubbing or edema noted bilaterally Neuro: Cranial nerves II-XII intact, no focal neurological deficits   The results of significant diagnostics from this hospitalization (including imaging, microbiology, ancillary and laboratory) are listed below for reference.      Procedures/Studies:  Dg Chest Portable 1 View  Result Date: 11/01/2018 CLINICAL DATA:  Fever and cough EXAM: PORTABLE CHEST 1 VIEW COMPARISON:  09/01/2018 FINDINGS: The heart size and mediastinal contours are within normal limits. Right basilar atelectasis. Ventriculoperitoneal shunt catheter courses along the right chest. The visualized skeletal structures are unremarkable. IMPRESSION: Right basilar opacities, likely atelectasis. Electronically Signed   By: Ulyses Jarred M.D.   On: 11/01/2018 03:35       LAB RESULTS: Basic Metabolic Panel: Recent Labs  Lab  11/02/18 0515 11/03/18 0504  NA 140 141  K 3.8 3.8  CL 111 109  CO2 24 24  GLUCOSE 114* 125*  BUN 12 13  CREATININE 0.85 0.86  CALCIUM 8.8* 9.2   Liver Function Tests: Recent Labs  Lab 11/01/18 0347  AST 17  ALT 18  ALKPHOS 89  BILITOT 0.5  PROT 7.8  ALBUMIN 3.7   No results for input(s): LIPASE, AMYLASE in the last 168 hours. No results for input(s): AMMONIA in the last 168 hours. CBC: Recent Labs  Lab 11/01/18 0347 11/02/18 0515  WBC 10.9* 7.4  NEUTROABS 9.5*  --   HGB 9.5* 8.5*  HCT 32.3* 29.2*  MCV 77.6* 78.3*  PLT 341 304   Cardiac Enzymes: No results for input(s): CKTOTAL, CKMB, CKMBINDEX, TROPONINI in the last 168 hours. BNP: Invalid input(s): POCBNP CBG: Recent Labs  Lab 11/02/18 1138 11/02/18 2129  GLUCAP 120* 104*      Disposition and Follow-up: Discharge Instructions    Diet Carb Modified   Complete by:  As directed    Increase activity slowly    Complete by:  As directed        DISPOSITION: [place], Derek Michael, MD Follow up in 2 week(s).   Specialty:  Internal Medicine Why:  call for appt for e-visit or tele health visit  Contact information: [place] [address] [phone]            Time coordinating discharge:  20mns   Signed:   REstill CottaM.D. [facility] 11/03/2018, 11:58 AM

## 2018-11-06 LAB — CULTURE, BLOOD (ROUTINE X 2)
Culture: NO GROWTH
Culture: NO GROWTH

## 2018-11-07 ENCOUNTER — Emergency Department (HOSPITAL_COMMUNITY): Payer: Medicare Other

## 2018-11-07 ENCOUNTER — Other Ambulatory Visit: Payer: Self-pay

## 2018-11-07 ENCOUNTER — Emergency Department (HOSPITAL_COMMUNITY)
Admission: EM | Admit: 2018-11-07 | Discharge: 2018-11-07 | Disposition: A | Discharge status: Home / Self Care | Payer: Medicare Other | Attending: Emergency Medicine | Admitting: Emergency Medicine

## 2018-11-07 ENCOUNTER — Encounter (HOSPITAL_COMMUNITY): Payer: Self-pay

## 2018-11-07 DIAGNOSIS — Z79899 Other long term (current) drug therapy: Secondary | ICD-10-CM | POA: Diagnosis not present

## 2018-11-07 DIAGNOSIS — E119 Type 2 diabetes mellitus without complications: Secondary | ICD-10-CM | POA: Diagnosis not present

## 2018-11-07 DIAGNOSIS — Z7982 Long term (current) use of aspirin: Secondary | ICD-10-CM | POA: Diagnosis not present

## 2018-11-07 DIAGNOSIS — R519 Headache, unspecified: Secondary | ICD-10-CM

## 2018-11-07 DIAGNOSIS — R110 Nausea: Secondary | ICD-10-CM | POA: Diagnosis present

## 2018-11-07 DIAGNOSIS — Z7984 Long term (current) use of oral hypoglycemic drugs: Secondary | ICD-10-CM | POA: Insufficient documentation

## 2018-11-07 DIAGNOSIS — I10 Essential (primary) hypertension: Secondary | ICD-10-CM | POA: Insufficient documentation

## 2018-11-07 DIAGNOSIS — J45909 Unspecified asthma, uncomplicated: Secondary | ICD-10-CM | POA: Diagnosis not present

## 2018-11-07 DIAGNOSIS — R51 Headache: Secondary | ICD-10-CM | POA: Diagnosis not present

## 2018-11-07 MED ORDER — CARVEDILOL 6.25 MG PO TABS: 6.2500 mg | ORAL_TABLET | Freq: Two times a day (BID) | ORAL | 1 refills | Status: DC

## 2018-11-07 MED ORDER — CARVEDILOL 6.25 MG PO TABS
6.2500 mg | ORAL_TABLET | Freq: Two times a day (BID) | ORAL | Status: DC
  Filled 2018-11-07 (×2): qty 1

## 2018-11-07 MED ORDER — CARVEDILOL 3.125 MG PO TABS: 3.1250 mg | ORAL_TABLET | Freq: Two times a day (BID) | ORAL | 1 refills | Status: AC

## 2018-11-07 MED ORDER — CLONIDINE HCL 0.1 MG PO TABS: ORAL_TABLET | ORAL | 0 refills | Status: DC

## 2018-11-07 MED ORDER — CLONIDINE HCL 0.1 MG PO TABS
0.1000 mg | ORAL_TABLET | Freq: Once | ORAL | Status: AC
  Administered 2018-11-07: 0.1 mg via ORAL
  Filled 2018-11-07: qty 1

## 2018-11-07 NOTE — ED Provider Notes (Signed)
[facility] DEPT Provider Note   CSN: [id number] Arrival date & time: 11/07/18  1640    History   Chief Complaint Chief Complaint  Patient presents with  . Hypertension  . Nausea    HPI Carla Jensen is a 58 y.o. female.     HPI Patient went to urgent care for frontal headache.  She reports she thought that it was her sinuses.  She reports she gets pain along her cheekbones and in her brow above her eyes.  This is something that she particularly notices at season change and with temperature change.  She denies that she is having any blurred vision or double vision.  He does report sometimes suddenly feeling slightly nauseated.  She has not had any vomiting episodes.  No fevers no chills.  No confusion or gait incoordination.  Patient's blood pressure was found to be elevated at urgent care.  Patient just recently was discharged from the hospital with diagnosis of sepsis due to pyelonephritis, acute maxillary sinusitis and accelerated hypertension.  Patient had been on losartan which was continued at 100 mg and amlodipine 10 mg added.  Patient reports she has been compliant with these medications.  Her discharge blood pressure was 174/74.  She reports that she just got a blood pressure cuff and is started monitoring at home.  She reports pressures have remained in the 170s.  Patient has history of subarachnoid hemorrhage 9 years ago with subsequent obstructive hydrocephalus and VP shunt placement in 2011.  Patient reports that since that time, she has not had problems with chronic headaches, visual problems or any neurologic dysfunction.  She does feel like she has sinus problems and frequent sinus headaches with triggers.  She reports she has noted headaches just over the past 2 weeks.  Reports that they are episodic and do resolve completely.  She reports when she is having pain it does seem to be behind her nose behind her eyes and in her cheekbones. Past  Medical History:  Diagnosis Date  . Allergy   . Anemia   . Arthritis   . Asthma   . Chickenpox   . Depression   . Diabetes mellitus without complication ([place])   . GERD (gastroesophageal reflux disease)   . Hypertension   . Stroke ([place])   . Subarachnoid hemorrhage ([place]) 3/11   with obstructive hydrocephalus--Dr.Jenkins    Patient Active Problem List   Diagnosis Date Noted  . Pyelonephritis 11/01/2018  . Sepsis ([place]) 11/01/2018  . Acute sinusitis 11/01/2018  . Chronic anemia 11/01/2018  . Asthma 11/01/2018  . Bullous dermatitis 08/16/2014  . Sinusitis, acute maxillary 08/12/2014  . Skin infection 08/12/2014  . Dermatitis 08/12/2014  . Obesity (BMI 30-39.9) 10/19/2013  . Microcytic anemia 01/04/2013  . GERD (gastroesophageal reflux disease) 01/04/2013  . ICH (intracerebral hemorrhage) ([place]) 12/25/2012  . Unspecified asthma, with exacerbation 11/29/2012  . Lumbar herniated disc 11/29/2012  . Diabetes ([place]) 11/29/2012  . HTN (hypertension) 11/29/2012    Past Surgical History:  Procedure Laterality Date  . ACHILLES TENDON REPAIR Left 1/08   Dr.Norris  . ACHILLES TENDON REPAIR Right 7/09   Dr.Norris  . ELBOW SURGERY    . KNEE ARTHROSCOPY Left 5/06  . TENDON REPAIR Right 3/07   Elbow     OB History   No obstetric history on file.      Home Medications    Prior to Admission medications   Medication Sig Start Date End Date Taking? Authorizing Provider  albuterol (PROVENTIL HFA;VENTOLIN HFA) 108 (90 Base) MCG/ACT inhaler Inhale 2 puffs into the lungs every 6 (six) hours as needed for wheezing or shortness of breath.    [provider]  amLODipine (NORVASC) 10 MG tablet Take 1 tablet (10 mg total) by mouth daily. 11/03/18   Rai, Vernelle Emerald, MD  aspirin 81 MG tablet Take 81 mg by mouth daily.    [provider]  atorvastatin (LIPITOR) 10 MG tablet Take 10 mg by mouth at bedtime. 08/21/18   [provider]  Blood Glucose Monitoring Suppl (ONE  TOUCH ULTRA SYSTEM KIT) W/DEVICE KIT 1 kit by Does not apply route once. Dx. 250.00 11/30/12   Carollee Herter, Alferd Apa, DO  carvedilol (COREG) 6.25 MG tablet Take 1 tablet (6.25 mg total) by mouth 2 (two) times daily with a meal. 11/07/18   Charlesetta Shanks, MD  CINNAMON PO Take 500 mg by mouth 2 (two) times daily.     [provider]  ciprofloxacin (CIPRO) 500 MG tablet Take 1 tablet (500 mg total) by mouth 2 (two) times daily for 10 days. 11/04/18 11/14/18  Rai, Ripudeep Raliegh Ip, MD  cloNIDine (CATAPRES) 0.1 MG tablet Take 1 tablet every 8 hours as needed if blood pressure remains greater then 160/80 after taking all regularly prescribed blood pressure medications. 11/07/18   Charlesetta Shanks, MD  ferrous sulfate 325 (65 FE) MG tablet Take 325 mg by mouth 2 (two) times daily with a meal.     [provider]  fluticasone (FLONASE) 50 MCG/ACT nasal spray Place 2 sprays into both nostrils daily. 11/03/18   Rai, Ripudeep K, MD  gabapentin (NEURONTIN) 300 MG capsule TAKE 1 CAPSULE BY MOUTH  TWICE DAILY Patient taking differently: Take 300 mg by mouth 2 (two) times daily.  10/13/18   Roma Schanz R, DO  glucose blood test strip Use as instructed. Pt tests sugars once daily. Dx. 250.00 11/30/12   Carollee Herter, Alferd Apa, DO  Insulin Syringe-Needle U-100 (RELION INSULIN SYR 0.5ML/31G) 31G X 5/16" 0.5 ML MISC by Does not apply route.    [provider]  Lancets Thin MISC Use as directed. Pt tests sugars once daily. Dx. 250.00 11/30/12   Carollee Herter, Alferd Apa, DO  loratadine (CLARITIN) 10 MG tablet Take 1 tablet (10 mg total) by mouth daily. 11/03/18   Rai, Vernelle Emerald, MD  losartan (COZAAR) 100 MG tablet Take 100 mg by mouth daily. 09/20/18   [provider]  Melatonin 10 MG TABS Take 10 mg by mouth daily as needed (sleep).    [provider]  metFORMIN (GLUCOPHAGE) 500 MG tablet Take 500 mg by mouth 2 (two) times daily. 08/21/18   [provider]  montelukast (SINGULAIR)  10 MG tablet Take 1 tablet (10 mg total) by mouth at bedtime. 04/15/17   Roma Schanz R, DO  naproxen sodium (ALEVE) 220 MG tablet Take 2 tablets (440 mg total) by mouth daily as needed. For pain 11/03/18   Rai, Ripudeep K, MD  pantoprazole (PROTONIX) 40 MG tablet Take 1 tablet (40 mg total) by mouth daily. 09/01/18   Ann Held, DO  Senna (NATURAL VEGETABLE LAXATIVE) 65-325 MG TABS Take 1 tablet by mouth daily.     [provider]  traMADol (ULTRAM) 50 MG tablet Take 1 tablet (50 mg total) by mouth every 12 (twelve) hours as needed for severe pain. 11/03/18 11/03/19  Rai, Vernelle Emerald, MD  vitamin B-12 (CYANOCOBALAMIN) 1000 MCG tablet Take 1,000  mcg by mouth daily.    [provider]  vitamin C (ASCORBIC ACID) 500 MG tablet Take 500 mg by mouth 2 (two) times daily.     [provider]    Family History Family History  Problem Relation Age of Onset  . High blood pressure Father   . Arthritis Mother   . High blood pressure Mother   . Diabetes Mother   . High blood pressure Sister   . Colon cancer Neg Hx   . Esophageal cancer Neg Hx   . Pancreatic cancer Neg Hx   . Rectal cancer Neg Hx   . Stomach cancer Neg Hx     Social History Social History   Tobacco Use  . Smoking status: Never Smoker  . Smokeless tobacco: Never Used  Substance Use Topics  . Alcohol use: No    Alcohol/week: 0.0 standard drinks  . Drug use: No     Allergies   Patient has no known allergies.   Review of Systems Review of Systems 10 Systems reviewed and are negative for acute change except as noted in the HPI.   Physical Exam Updated Vital Signs BP (!) 144/76   Pulse 90   Temp 98.8 F (37.1 C) (Oral)   Resp 16   Ht '5\' 4"'$  (1.626 m)   Wt 117 kg   LMP 08/05/2010   SpO2 98%   BMI 44.29 kg/m   Physical Exam Constitutional:      Comments: Patient is alert and clinically well in appearance.  She does not appear in any distress.  No respiratory distress.   HENT:     Head: Normocephalic and atraumatic.     Comments: Patient does endorse tenderness to percussion over the frontal sinuses and zygoma.  He has no facial swelling or redness.    Right Ear: Tympanic membrane normal.     Left Ear: Tympanic membrane normal.     Nose: Nose normal.     Mouth/Throat:     Mouth: Mucous membranes are moist.     Pharynx: Oropharynx is clear.  Eyes:     Extraocular Movements: Extraocular movements intact.     Conjunctiva/sclera: Conjunctivae normal.     Pupils: Pupils are equal, round, and reactive to light.  Neck:     Musculoskeletal: Normal range of motion and neck supple.  Cardiovascular:     Rate and Rhythm: Normal rate and regular rhythm.  Pulmonary:     Effort: Pulmonary effort is normal.     Breath sounds: Normal breath sounds.  Abdominal:     General: There is no distension.     Palpations: Abdomen is soft.     Tenderness: There is no abdominal tenderness. There is no guarding.  Musculoskeletal: Normal range of motion.        General: No swelling or tenderness.     Right lower leg: No edema.     Left lower leg: No edema.  Skin:    General: Skin is warm and dry.  Neurological:     General: No focal deficit present.     Mental Status: She is oriented to person, place, and time.     Cranial Nerves: No cranial nerve deficit.     Coordination: Coordination normal.     Gait: Gait normal.  Psychiatric:        Mood and Affect: Mood normal.      ED Treatments / Results  Labs (all labs ordered are listed, but only abnormal results are displayed)  Labs Reviewed - No data to display  EKG None  Radiology Ct Head Wo Contrast  Result Date: 11/07/2018 CLINICAL DATA:  Acute headache and nausea for 1 day, history of stroke, subarachnoid hemorrhage, diabetes mellitus, hypertension, asthma EXAM: CT HEAD WITHOUT CONTRAST TECHNIQUE: Contiguous axial images were obtained from the base of the skull through the vertex without intravenous contrast.  Sagittal and coronal MPR images reconstructed from axial data set. COMPARISON:  10/24/2009 FINDINGS: Brain: Intraventricular shunt via RIGHT parietal approach with tip at the anterior LEFT lateral ventricle. Normal ventricular morphology without hydrocephalus; decompression of ventricular system since prior exam. No midline shift or mass effect. Normal appearance of brain parenchyma. No intracranial hemorrhage, mass lesion, or evidence of acute infarction. No extra-axial fluid collections. Vascular: Unremarkable Skull: Intact Sinuses/Orbits: Clear Other: N/A IMPRESSION: No intracranial abnormalities. Decompression of ventricular system since 2011, prior intraventricular shunt placement. Electronically Signed   By: Lavonia Dana M.D.   On: 11/07/2018 19:51    Procedures Procedures (including critical care time)  Medications Ordered in ED Medications  carvedilol (COREG) tablet 6.25 mg (has no administration in time range)  cloNIDine (CATAPRES) tablet 0.1 mg (0.1 mg Oral Given 11/07/18 1930)     Initial Impression / Assessment and Plan / ED Course  I have reviewed the triage vital signs and the nursing notes.  Pertinent labs & imaging results that were available during my care of the patient were reviewed by me and considered in my medical decision making (see chart for details).       Patient did feel that her symptoms did completely resolve after blood pressure normalized with 0.1 mg of clonidine.  She ate a snack reporting she had not eaten since much earlier in the day.  After this, patient reports that all symptoms resolved.  Patient did not have any neurologic deficits and no visual complaints.  She perceived her symptoms to be sinus related.  Patient's blood pressure however continues to be elevated since last documented value at hospital discharge.  CT head did not show any acute abnormalities.  Note development of interim hydrocephalus.  At this time no indication the patient was having  shunt malfunction.  Symptoms are not suggestive of pseudotumor.  Suspect symptoms are due to hypertension.  Patient responded well to 0.1 mg clonidine.  Will add a low-dose of carvedilol with detailed instructions on management and follow-up.  Turn precautions reviewed.  Final Clinical Impressions(s) / ED Diagnoses   Final diagnoses:  Essential hypertension  Frontal headache    ED Discharge Orders         Ordered    carvedilol (COREG) 6.25 MG tablet  2 times daily with meals     11/07/18 2054    cloNIDine (CATAPRES) 0.1 MG tablet     11/07/18 2054           Charlesetta Shanks, MD 11/07/18 2129

## 2018-11-07 NOTE — Discharge Instructions (Addendum)
1.  Monitor your blood pressures at home 3 times a day and keep a log.  2.  Continue your losartan and amlodipine as prescribed.  Start carvedilol 3.125mg  twice a day, first dose tomorrow morning if blood pressure greater than 150/80  3.  If blood pressure is less than 120/70, stopped taking the carvedilol tablet.  If, blood pressure is trending back up, you may restart the carvedilol tablet.  4.  If your blood pressure remains greater then 160/90 despite taking the carvedilol 3.125mg ,  you may take 2 carvedilol tablets twice a day (6.25mg )  5.  Make an appointment with your family doctor soon as possible to start monitoring your response to treatment.  Return to the emergency department immediately if you start having any problems with bad headaches, confusion, visual problems or other concerning symptoms.

## 2018-11-07 NOTE — ED Triage Notes (Signed)
Patient reports that she woke this AM with headache and nausea. Patient states she was recently discharged from the hospital. Patient went to the [facility] and was told to come to the hospital. BP was 172/75.  ED BP-174/75.

## 2018-11-07 NOTE — ED Notes (Signed)
Patient transported to CT 

## 2018-11-08 ENCOUNTER — Other Ambulatory Visit: Payer: Self-pay

## 2018-11-08 ENCOUNTER — Encounter (HOSPITAL_COMMUNITY): Payer: Self-pay

## 2018-11-08 ENCOUNTER — Emergency Department (HOSPITAL_COMMUNITY)
Admission: EM | Admit: 2018-11-08 | Discharge: 2018-11-08 | Disposition: A | Discharge status: Home / Self Care | Payer: Medicare Other | Attending: Emergency Medicine | Admitting: Emergency Medicine

## 2018-11-08 DIAGNOSIS — Z794 Long term (current) use of insulin: Secondary | ICD-10-CM | POA: Diagnosis not present

## 2018-11-08 DIAGNOSIS — R51 Headache: Secondary | ICD-10-CM | POA: Diagnosis not present

## 2018-11-08 DIAGNOSIS — E119 Type 2 diabetes mellitus without complications: Secondary | ICD-10-CM | POA: Diagnosis not present

## 2018-11-08 DIAGNOSIS — Z8673 Personal history of transient ischemic attack (TIA), and cerebral infarction without residual deficits: Secondary | ICD-10-CM | POA: Diagnosis not present

## 2018-11-08 DIAGNOSIS — R519 Headache, unspecified: Secondary | ICD-10-CM

## 2018-11-08 DIAGNOSIS — R110 Nausea: Secondary | ICD-10-CM

## 2018-11-08 DIAGNOSIS — I10 Essential (primary) hypertension: Secondary | ICD-10-CM

## 2018-11-08 DIAGNOSIS — Z7982 Long term (current) use of aspirin: Secondary | ICD-10-CM | POA: Diagnosis not present

## 2018-11-08 DIAGNOSIS — Z79899 Other long term (current) drug therapy: Secondary | ICD-10-CM | POA: Diagnosis not present

## 2018-11-08 MED ORDER — ACETAMINOPHEN 325 MG PO TABS
650.0000 mg | ORAL_TABLET | Freq: Once | ORAL | Status: AC
  Administered 2018-11-08: 19:00:00 650 mg via ORAL
  Filled 2018-11-08: qty 2

## 2018-11-08 MED ORDER — ONDANSETRON 4 MG PO TBDP: 4.0000 mg | ORAL_TABLET | Freq: Three times a day (TID) | ORAL | 0 refills | Status: AC | PRN

## 2018-11-08 MED ORDER — METOCLOPRAMIDE HCL 5 MG/ML IJ SOLN
10.0000 mg | Freq: Once | INTRAMUSCULAR | Status: AC
  Administered 2018-11-08: 10 mg via INTRAVENOUS
  Filled 2018-11-08: qty 2

## 2018-11-08 MED ORDER — SODIUM CHLORIDE 0.9 % IV BOLUS
500.0000 mL | Freq: Once | INTRAVENOUS | Status: AC
  Administered 2018-11-08: 500 mL via INTRAVENOUS

## 2018-11-08 MED ORDER — DIPHENHYDRAMINE HCL 50 MG/ML IJ SOLN
12.5000 mg | Freq: Once | INTRAMUSCULAR | Status: AC
  Administered 2018-11-08: 12.5 mg via INTRAVENOUS
  Filled 2018-11-08: qty 1

## 2018-11-08 NOTE — ED Provider Notes (Signed)
[facility] DEPT Provider Note   CSN: [id number] Arrival date & time: 11/08/18  1736    History   Chief Complaint Chief Complaint  Patient presents with  . Hypertension    HPI Carla Jensen is a 58 y.o. female who presents with hypertension. PMH significant for asthma, allergies, DM, GERD, hx of SAH with obstructive hydrocephalus in 2011. She was recently admitted for sepsis due to pyelonephritis last week. Her BP meds were adjusted at that time (amlodipine was added). She went to UC yesterday and was advised to come to the ED since her BP was elevated and she had a continuous headache. She was seen in the ED last night and had a CT head done which was negative. Her symptoms resolved after she was given Clonidine in the ED. She was given an additional rx for Coreg.  Patient states that she went home last night and went to sleep.  When she got up this morning she checked her blood pressure and it was 563O systolic.  She took her Coreg and rechecked her blood pressure and it was still elevated.  She went about her day and came home and started to have nausea.  She also noted that her headache was coming back.  It feels like a sinus headache.  She is still taking antibiotics for acute sinusitis.  He reports some dizziness which she attributed to the Coreg.  She began to worry and was having some chest pressure and palpitations.  She tried to calm herself down and took another Coreg however several hours later when she took her blood pressure again the number was still high.  She notes she has an appointment with her PCP tomorrow morning for a telemedicine visit.  She states the nausea is bothering her more than the headache which is mild.  She is still having some mild dizziness.  She denies any chest pain, shortness of breath, or palpitations currently.  No vomiting or vision changes.   HPI  Past Medical History:  Diagnosis Date  . Allergy   . Anemia   . Arthritis    . Asthma   . Chickenpox   . Depression   . Diabetes mellitus without complication ([place])   . GERD (gastroesophageal reflux disease)   . Hypertension   . Stroke ([place])   . Subarachnoid hemorrhage ([place]) 3/11   with obstructive hydrocephalus--Dr.Jenkins    Patient Active Problem List   Diagnosis Date Noted  . Pyelonephritis 11/01/2018  . Sepsis ([place]) 11/01/2018  . Acute sinusitis 11/01/2018  . Chronic anemia 11/01/2018  . Asthma 11/01/2018  . Bullous dermatitis 08/16/2014  . Sinusitis, acute maxillary 08/12/2014  . Skin infection 08/12/2014  . Dermatitis 08/12/2014  . Obesity (BMI 30-39.9) 10/19/2013  . Microcytic anemia 01/04/2013  . GERD (gastroesophageal reflux disease) 01/04/2013  . ICH (intracerebral hemorrhage) ([place]) 12/25/2012  . Unspecified asthma, with exacerbation 11/29/2012  . Lumbar herniated disc 11/29/2012  . Diabetes ([place]) 11/29/2012  . HTN (hypertension) 11/29/2012    Past Surgical History:  Procedure Laterality Date  . ACHILLES TENDON REPAIR Left 1/08   Dr.Norris  . ACHILLES TENDON REPAIR Right 7/09   Dr.Norris  . ELBOW SURGERY    . KNEE ARTHROSCOPY Left 5/06  . TENDON REPAIR Right 3/07   Elbow     OB History   No obstetric history on file.      Home Medications    Prior to Admission medications   Medication Sig Start Date End Date  Taking? Authorizing Provider  albuterol (PROVENTIL HFA;VENTOLIN HFA) 108 (90 Base) MCG/ACT inhaler Inhale 2 puffs into the lungs every 6 (six) hours as needed for wheezing or shortness of breath.    [provider]  amLODipine (NORVASC) 10 MG tablet Take 1 tablet (10 mg total) by mouth daily. 11/03/18   Rai, Vernelle Emerald, MD  aspirin 81 MG tablet Take 81 mg by mouth daily.    [provider]  atorvastatin (LIPITOR) 10 MG tablet Take 10 mg by mouth at bedtime. 08/21/18   [provider]  Blood Glucose Monitoring Suppl (ONE TOUCH ULTRA SYSTEM KIT) W/DEVICE KIT 1 kit by Does not apply route once.  Dx. 250.00 11/30/12   Carollee Herter, Alferd Apa, DO  carvedilol (COREG) 3.125 MG tablet Take 1 tablet (3.125 mg total) by mouth 2 (two) times daily with a meal. 11/07/18   Charlesetta Shanks, MD  CINNAMON PO Take 500 mg by mouth 2 (two) times daily.     [provider]  ciprofloxacin (CIPRO) 500 MG tablet Take 1 tablet (500 mg total) by mouth 2 (two) times daily for 10 days. 11/04/18 11/14/18  Rai, Ripudeep K, MD  ferrous sulfate 325 (65 FE) MG tablet Take 325 mg by mouth 2 (two) times daily with a meal.     [provider]  fluticasone (FLONASE) 50 MCG/ACT nasal spray Place 2 sprays into both nostrils daily. 11/03/18   Rai, Ripudeep K, MD  gabapentin (NEURONTIN) 300 MG capsule TAKE 1 CAPSULE BY MOUTH  TWICE DAILY Patient taking differently: Take 300 mg by mouth 2 (two) times daily.  10/13/18   Roma Schanz R, DO  glucose blood test strip Use as instructed. Pt tests sugars once daily. Dx. 250.00 11/30/12   Carollee Herter, Alferd Apa, DO  Insulin Syringe-Needle U-100 (RELION INSULIN SYR 0.5ML/31G) 31G X 5/16" 0.5 ML MISC by Does not apply route.    [provider]  Lancets Thin MISC Use as directed. Pt tests sugars once daily. Dx. 250.00 11/30/12   Carollee Herter, Alferd Apa, DO  loratadine (CLARITIN) 10 MG tablet Take 1 tablet (10 mg total) by mouth daily. 11/03/18   Rai, Vernelle Emerald, MD  losartan (COZAAR) 100 MG tablet Take 100 mg by mouth daily. 09/20/18   [provider]  Melatonin 10 MG TABS Take 10 mg by mouth daily as needed (sleep).    [provider]  metFORMIN (GLUCOPHAGE) 500 MG tablet Take 500 mg by mouth 2 (two) times daily. 08/21/18   [provider]  montelukast (SINGULAIR) 10 MG tablet Take 1 tablet (10 mg total) by mouth at bedtime. 04/15/17   Roma Schanz R, DO  naproxen sodium (ALEVE) 220 MG tablet Take 2 tablets (440 mg total) by mouth daily as needed. For pain 11/03/18   Rai, Ripudeep K, MD  pantoprazole (PROTONIX) 40 MG tablet Take 1 tablet (40  mg total) by mouth daily. 09/01/18   Ann Held, DO  Senna (NATURAL VEGETABLE LAXATIVE) 65-325 MG TABS Take 1 tablet by mouth daily.     [provider]  traMADol (ULTRAM) 50 MG tablet Take 1 tablet (50 mg total) by mouth every 12 (twelve) hours as needed for severe pain. 11/03/18 11/03/19  Rai, Vernelle Emerald, MD  vitamin B-12 (CYANOCOBALAMIN) 1000 MCG tablet Take 1,000 mcg by mouth daily.    [provider]  vitamin C (ASCORBIC ACID) 500 MG tablet Take 500 mg by mouth 2 (two) times daily.  [provider]    Family History Family History  Problem Relation Age of Onset  . High blood pressure Father   . Arthritis Mother   . High blood pressure Mother   . Diabetes Mother   . High blood pressure Sister   . Colon cancer Neg Hx   . Esophageal cancer Neg Hx   . Pancreatic cancer Neg Hx   . Rectal cancer Neg Hx   . Stomach cancer Neg Hx     Social History Social History   Tobacco Use  . Smoking status: Never Smoker  . Smokeless tobacco: Never Used  Substance Use Topics  . Alcohol use: No    Alcohol/week: 0.0 standard drinks  . Drug use: No     Allergies   Patient has no known allergies.   Review of Systems Review of Systems  Constitutional: Positive for appetite change. Negative for fever.  Eyes: Negative for visual disturbance.  Respiratory: Negative for shortness of breath.   Cardiovascular: Positive for chest pain and palpitations (resolved).  Gastrointestinal: Positive for nausea. Negative for abdominal pain and vomiting.  Neurological: Positive for dizziness and headaches. Negative for syncope, weakness and numbness.  All other systems reviewed and are negative.    Physical Exam Updated Vital Signs BP (!) 186/84 (BP Location: Right Arm)   Pulse 93   Temp 98.4 F (36.9 C) (Oral)   Resp 20   Ht '5\' 4"'$  (1.626 m)   Wt 117 kg   LMP 08/05/2010   SpO2 97%   BMI 44.29 kg/m   Physical Exam Vitals signs and nursing note reviewed.   Constitutional:      General: She is not in acute distress.    Appearance: She is well-developed. She is obese. She is not ill-appearing.     Comments: Calm, cooperative. Well appearing  HENT:     Head: Normocephalic and atraumatic.  Eyes:     General: No scleral icterus.       Right eye: No discharge.        Left eye: No discharge.     Conjunctiva/sclera: Conjunctivae normal.     Pupils: Pupils are equal, round, and reactive to light.  Neck:     Musculoskeletal: Normal range of motion.  Cardiovascular:     Rate and Rhythm: Normal rate and regular rhythm.  Pulmonary:     Effort: Pulmonary effort is normal. No respiratory distress.     Breath sounds: Normal breath sounds.  Abdominal:     General: There is no distension.     Palpations: Abdomen is soft.     Tenderness: There is no abdominal tenderness.  Skin:    General: Skin is warm and dry.  Neurological:     Mental Status: She is alert and oriented to person, place, and time.     Comments: Mental Status:  Alert, oriented, thought content appropriate, able to give a coherent history. Speech fluent without evidence of aphasia. Able to follow 2 step commands without difficulty.  Cranial Nerves:  II:  Peripheral visual fields grossly normal, pupils equal, round, reactive to light III,IV, VI: ptosis not present, extra-ocular motions intact bilaterally  V,VII: smile symmetric, facial light touch sensation equal VIII: hearing grossly normal to voice  X: uvula elevates symmetrically  XI: bilateral shoulder shrug symmetric and strong XII: midline tongue extension without fassiculations Motor:  Normal tone. 5/5 in upper and lower extremities bilaterally including strong and equal grip strength and dorsiflexion/plantar flexion Sensory: Pinprick and light touch normal  in all extremities.  Cerebellar: normal finger-to-nose with bilateral upper extremities Gait: Slightly off balance with walking. No ataxia CV: distal pulses palpable  throughout    Psychiatric:        Behavior: Behavior normal.      ED Treatments / Results  Labs (all labs ordered are listed, but only abnormal results are displayed) Labs Reviewed - No data to display  EKG None  Radiology Ct Head Wo Contrast  Result Date: 11/07/2018 CLINICAL DATA:  Acute headache and nausea for 1 day, history of stroke, subarachnoid hemorrhage, diabetes mellitus, hypertension, asthma EXAM: CT HEAD WITHOUT CONTRAST TECHNIQUE: Contiguous axial images were obtained from the base of the skull through the vertex without intravenous contrast. Sagittal and coronal MPR images reconstructed from axial data set. COMPARISON:  10/24/2009 FINDINGS: Brain: Intraventricular shunt via RIGHT parietal approach with tip at the anterior LEFT lateral ventricle. Normal ventricular morphology without hydrocephalus; decompression of ventricular system since prior exam. No midline shift or mass effect. Normal appearance of brain parenchyma. No intracranial hemorrhage, mass lesion, or evidence of acute infarction. No extra-axial fluid collections. Vascular: Unremarkable Skull: Intact Sinuses/Orbits: Clear Other: N/A IMPRESSION: No intracranial abnormalities. Decompression of ventricular system since 2011, prior intraventricular shunt placement. Electronically Signed   By: Lavonia Dana M.D.   On: 11/07/2018 19:51    Procedures Procedures (including critical care time)  Medications Ordered in ED Medications - No data to display   Initial Impression / Assessment and Plan / ED Course  I have reviewed the triage vital signs and the nursing notes.  Pertinent labs & imaging results that were available during my care of the patient were reviewed by me and considered in my medical decision making (see chart for details).  58 year old female presents with elevated blood pressure, headache, and nausea. BP is elevated here - ~180/80. Other vitals are normal. She has a normal neurologic exam. Head CT  from yesterday was reviewed and was negative. Suspect anxiety component as she just got a cuff and is checking BP more frequently. She also endorses that it is making her more worried. Elevated BP could also be due to her symptoms - will treat with headache cocktail and reassess.  BP is slightly better - 150-170. She feels better. With a reassuring exam and resolved symptoms, she can f/u with her PCP tomorrow. She verbalized understanding.   Final Clinical Impressions(s) / ED Diagnoses   Final diagnoses:  Hypertension, unspecified type  Acute nonintractable headache, unspecified headache type  Nausea    ED Discharge Orders    None       Recardo Evangelist, PA-C 11/08/18 2103    Little, Wenda Overland, MD 11/08/18 2317

## 2018-11-08 NOTE — ED Triage Notes (Signed)
States seen here yesterday for high blood pressure and sent home took medication this morning and states blood pressure still going up with headache and nausea.

## 2018-11-08 NOTE — Discharge Instructions (Signed)
Take Aleve as needed for headache Take Zofran for nausea Continue blood pressure medicine prescribed to you Please follow up with your doctor Return if you are worsening

## 2018-11-09 LAB — GLUCOSE, CAPILLARY
Glucose-Capillary: 129 mg/dL — ABNORMAL HIGH (ref 70–99)
Glucose-Capillary: 122 mg/dL — ABNORMAL HIGH (ref 70–99)

## 2019-02-23 ENCOUNTER — Other Ambulatory Visit: Payer: Self-pay

## 2019-02-23 DIAGNOSIS — Z20822 Contact with and (suspected) exposure to covid-19: Secondary | ICD-10-CM

## 2019-02-25 LAB — NOVEL CORONAVIRUS, NAA: SARS-CoV-2, NAA: DETECTED — AB

## 2019-04-25 ENCOUNTER — Other Ambulatory Visit: Payer: Self-pay | Admitting: *Deleted

## 2019-04-25 DIAGNOSIS — Z1231 Encounter for screening mammogram for malignant neoplasm of breast: Secondary | ICD-10-CM

## 2019-10-10 DIAGNOSIS — R42 Dizziness and giddiness: Secondary | ICD-10-CM | POA: Diagnosis not present

## 2019-10-10 DIAGNOSIS — R195 Other fecal abnormalities: Secondary | ICD-10-CM | POA: Diagnosis not present

## 2019-10-10 DIAGNOSIS — D500 Iron deficiency anemia secondary to blood loss (chronic): Secondary | ICD-10-CM | POA: Diagnosis not present

## 2019-10-10 DIAGNOSIS — Z982 Presence of cerebrospinal fluid drainage device: Secondary | ICD-10-CM | POA: Diagnosis not present

## 2019-10-10 DIAGNOSIS — Z8673 Personal history of transient ischemic attack (TIA), and cerebral infarction without residual deficits: Secondary | ICD-10-CM | POA: Diagnosis not present

## 2019-10-10 DIAGNOSIS — I10 Essential (primary) hypertension: Secondary | ICD-10-CM | POA: Diagnosis not present

## 2019-10-10 DIAGNOSIS — D649 Anemia, unspecified: Secondary | ICD-10-CM | POA: Diagnosis not present

## 2019-10-10 DIAGNOSIS — K219 Gastro-esophageal reflux disease without esophagitis: Secondary | ICD-10-CM | POA: Diagnosis not present

## 2019-10-10 DIAGNOSIS — E119 Type 2 diabetes mellitus without complications: Secondary | ICD-10-CM | POA: Diagnosis not present

## 2019-10-11 DIAGNOSIS — Z982 Presence of cerebrospinal fluid drainage device: Secondary | ICD-10-CM | POA: Diagnosis not present

## 2019-10-12 DIAGNOSIS — R1319 Other dysphagia: Secondary | ICD-10-CM | POA: Diagnosis not present

## 2019-10-12 DIAGNOSIS — D509 Iron deficiency anemia, unspecified: Secondary | ICD-10-CM | POA: Diagnosis not present

## 2019-10-12 DIAGNOSIS — R110 Nausea: Secondary | ICD-10-CM | POA: Diagnosis not present

## 2019-10-12 DIAGNOSIS — R6881 Early satiety: Secondary | ICD-10-CM | POA: Diagnosis not present

## 2019-10-15 DIAGNOSIS — I10 Essential (primary) hypertension: Secondary | ICD-10-CM | POA: Diagnosis not present

## 2019-10-15 DIAGNOSIS — E1142 Type 2 diabetes mellitus with diabetic polyneuropathy: Secondary | ICD-10-CM | POA: Diagnosis not present

## 2019-10-15 DIAGNOSIS — Z79899 Other long term (current) drug therapy: Secondary | ICD-10-CM | POA: Diagnosis not present

## 2019-10-15 DIAGNOSIS — G919 Hydrocephalus, unspecified: Secondary | ICD-10-CM | POA: Diagnosis not present

## 2019-10-15 DIAGNOSIS — D509 Iron deficiency anemia, unspecified: Secondary | ICD-10-CM | POA: Diagnosis not present

## 2019-11-14 DIAGNOSIS — R6881 Early satiety: Secondary | ICD-10-CM | POA: Diagnosis not present

## 2019-11-14 DIAGNOSIS — R110 Nausea: Secondary | ICD-10-CM | POA: Diagnosis not present

## 2019-11-14 DIAGNOSIS — K228 Other specified diseases of esophagus: Secondary | ICD-10-CM | POA: Diagnosis not present

## 2019-11-14 DIAGNOSIS — K317 Polyp of stomach and duodenum: Secondary | ICD-10-CM | POA: Diagnosis not present

## 2019-11-14 DIAGNOSIS — D509 Iron deficiency anemia, unspecified: Secondary | ICD-10-CM | POA: Diagnosis not present

## 2019-11-14 DIAGNOSIS — K3189 Other diseases of stomach and duodenum: Secondary | ICD-10-CM | POA: Diagnosis not present

## 2019-11-14 DIAGNOSIS — K5730 Diverticulosis of large intestine without perforation or abscess without bleeding: Secondary | ICD-10-CM | POA: Diagnosis not present

## 2019-11-14 DIAGNOSIS — Q394 Esophageal web: Secondary | ICD-10-CM | POA: Diagnosis not present

## 2019-11-14 DIAGNOSIS — R1319 Other dysphagia: Secondary | ICD-10-CM | POA: Diagnosis not present

## 2019-11-14 DIAGNOSIS — K648 Other hemorrhoids: Secondary | ICD-10-CM | POA: Diagnosis not present

## 2019-12-31 DIAGNOSIS — K317 Polyp of stomach and duodenum: Secondary | ICD-10-CM | POA: Diagnosis not present

## 2019-12-31 DIAGNOSIS — D509 Iron deficiency anemia, unspecified: Secondary | ICD-10-CM | POA: Diagnosis not present

## 2019-12-31 DIAGNOSIS — R1319 Other dysphagia: Secondary | ICD-10-CM | POA: Diagnosis not present

## 2020-01-11 DIAGNOSIS — Z79899 Other long term (current) drug therapy: Secondary | ICD-10-CM | POA: Diagnosis not present

## 2020-01-11 DIAGNOSIS — Z0001 Encounter for general adult medical examination with abnormal findings: Secondary | ICD-10-CM | POA: Diagnosis not present

## 2020-02-12 DIAGNOSIS — M5126 Other intervertebral disc displacement, lumbar region: Secondary | ICD-10-CM | POA: Diagnosis not present

## 2020-02-18 DIAGNOSIS — M5126 Other intervertebral disc displacement, lumbar region: Secondary | ICD-10-CM | POA: Diagnosis not present

## 2020-02-18 DIAGNOSIS — M5136 Other intervertebral disc degeneration, lumbar region: Secondary | ICD-10-CM | POA: Diagnosis not present

## 2020-11-10 ENCOUNTER — Other Ambulatory Visit: Payer: Self-pay | Admitting: Family

## 2020-11-10 ENCOUNTER — Other Ambulatory Visit: Payer: Self-pay

## 2020-11-10 ENCOUNTER — Ambulatory Visit
Admission: RE | Admit: 2020-11-10 | Discharge: 2020-11-10 | Disposition: A | Discharge status: Home / Self Care | Payer: Medicare Other | Source: Ambulatory Visit | Attending: Family | Admitting: Family

## 2020-11-10 DIAGNOSIS — M25512 Pain in left shoulder: Secondary | ICD-10-CM

## 2020-11-10 DIAGNOSIS — M549 Dorsalgia, unspecified: Secondary | ICD-10-CM

## 2021-06-18 IMAGING — CR DG THORACIC SPINE 3V
3 series · 3 of 3 positions shown · non-contrast
Comparison: None.

CLINICAL DATA: Back pain.

EXAM:
THORACIC SPINE - 3 VIEWS

[w thoracic spine ap]
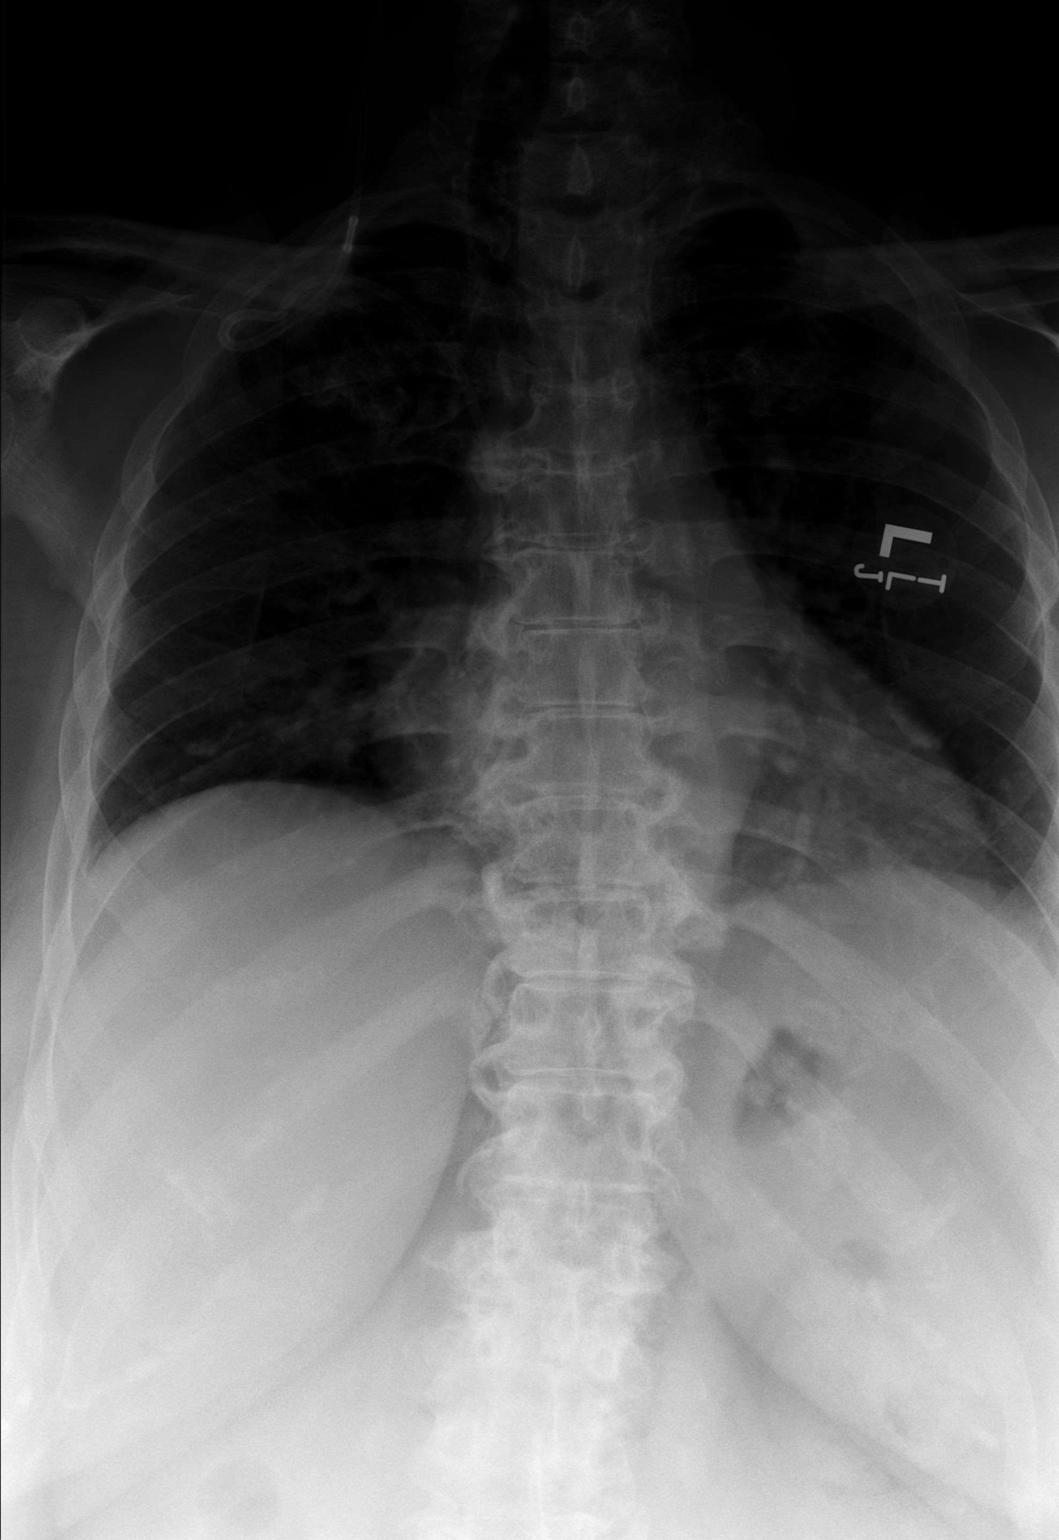

[w thoracic spine lat]
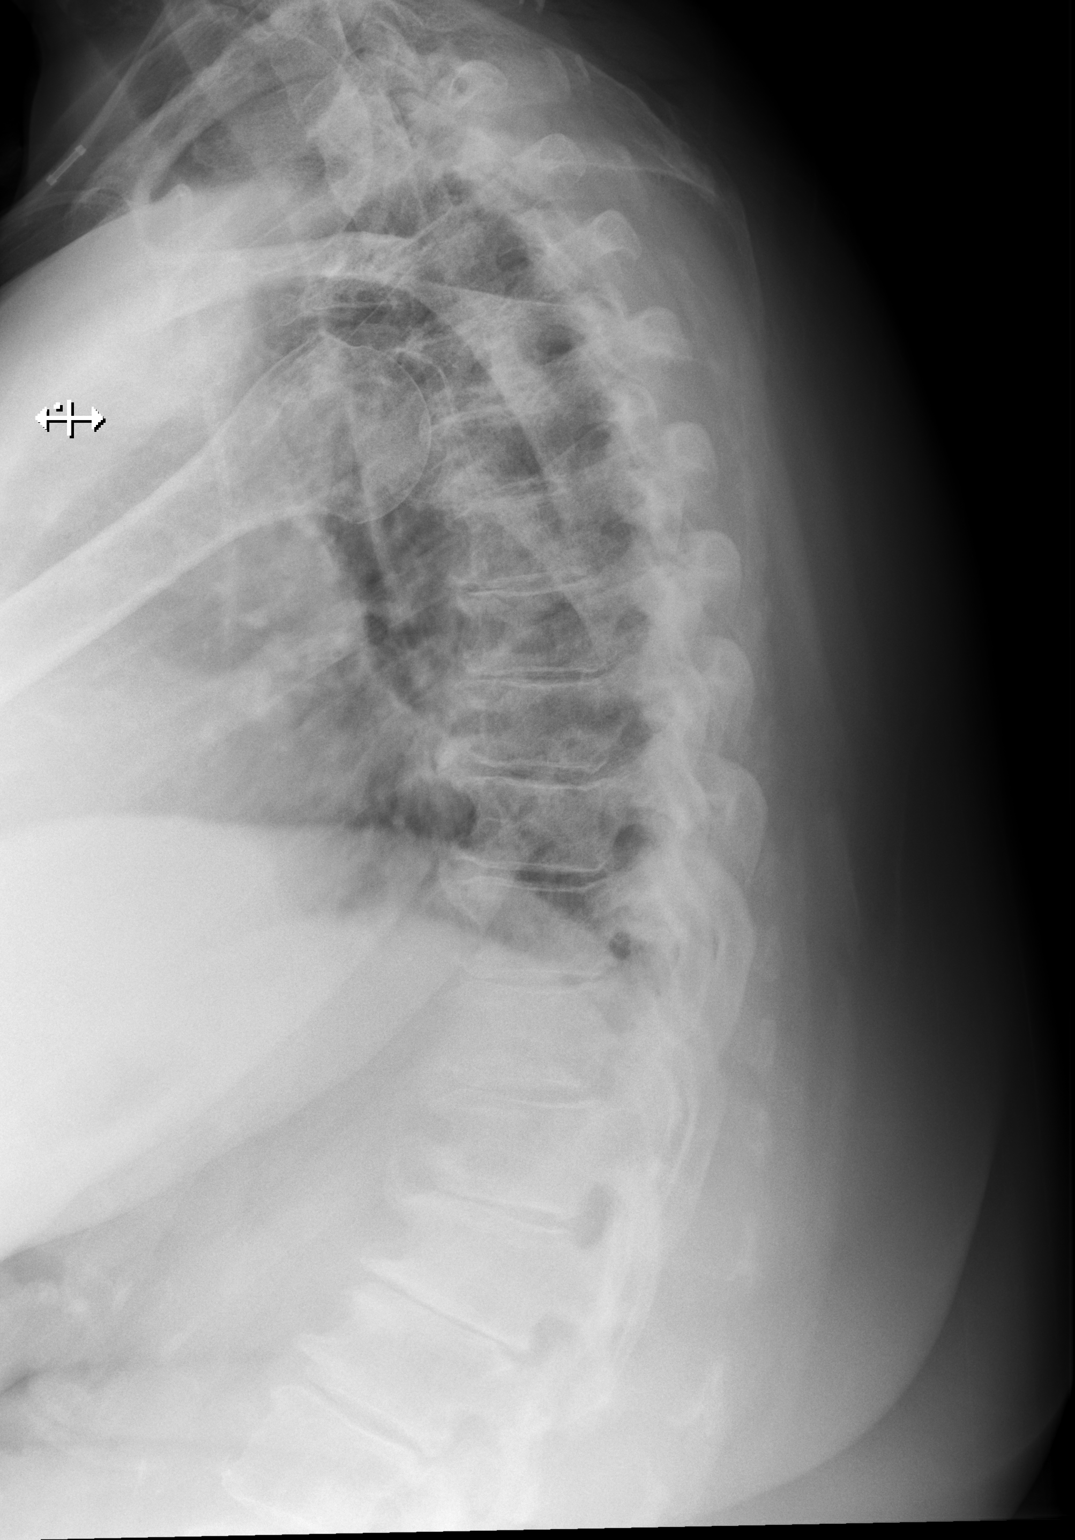

[w thoracic swimmers]
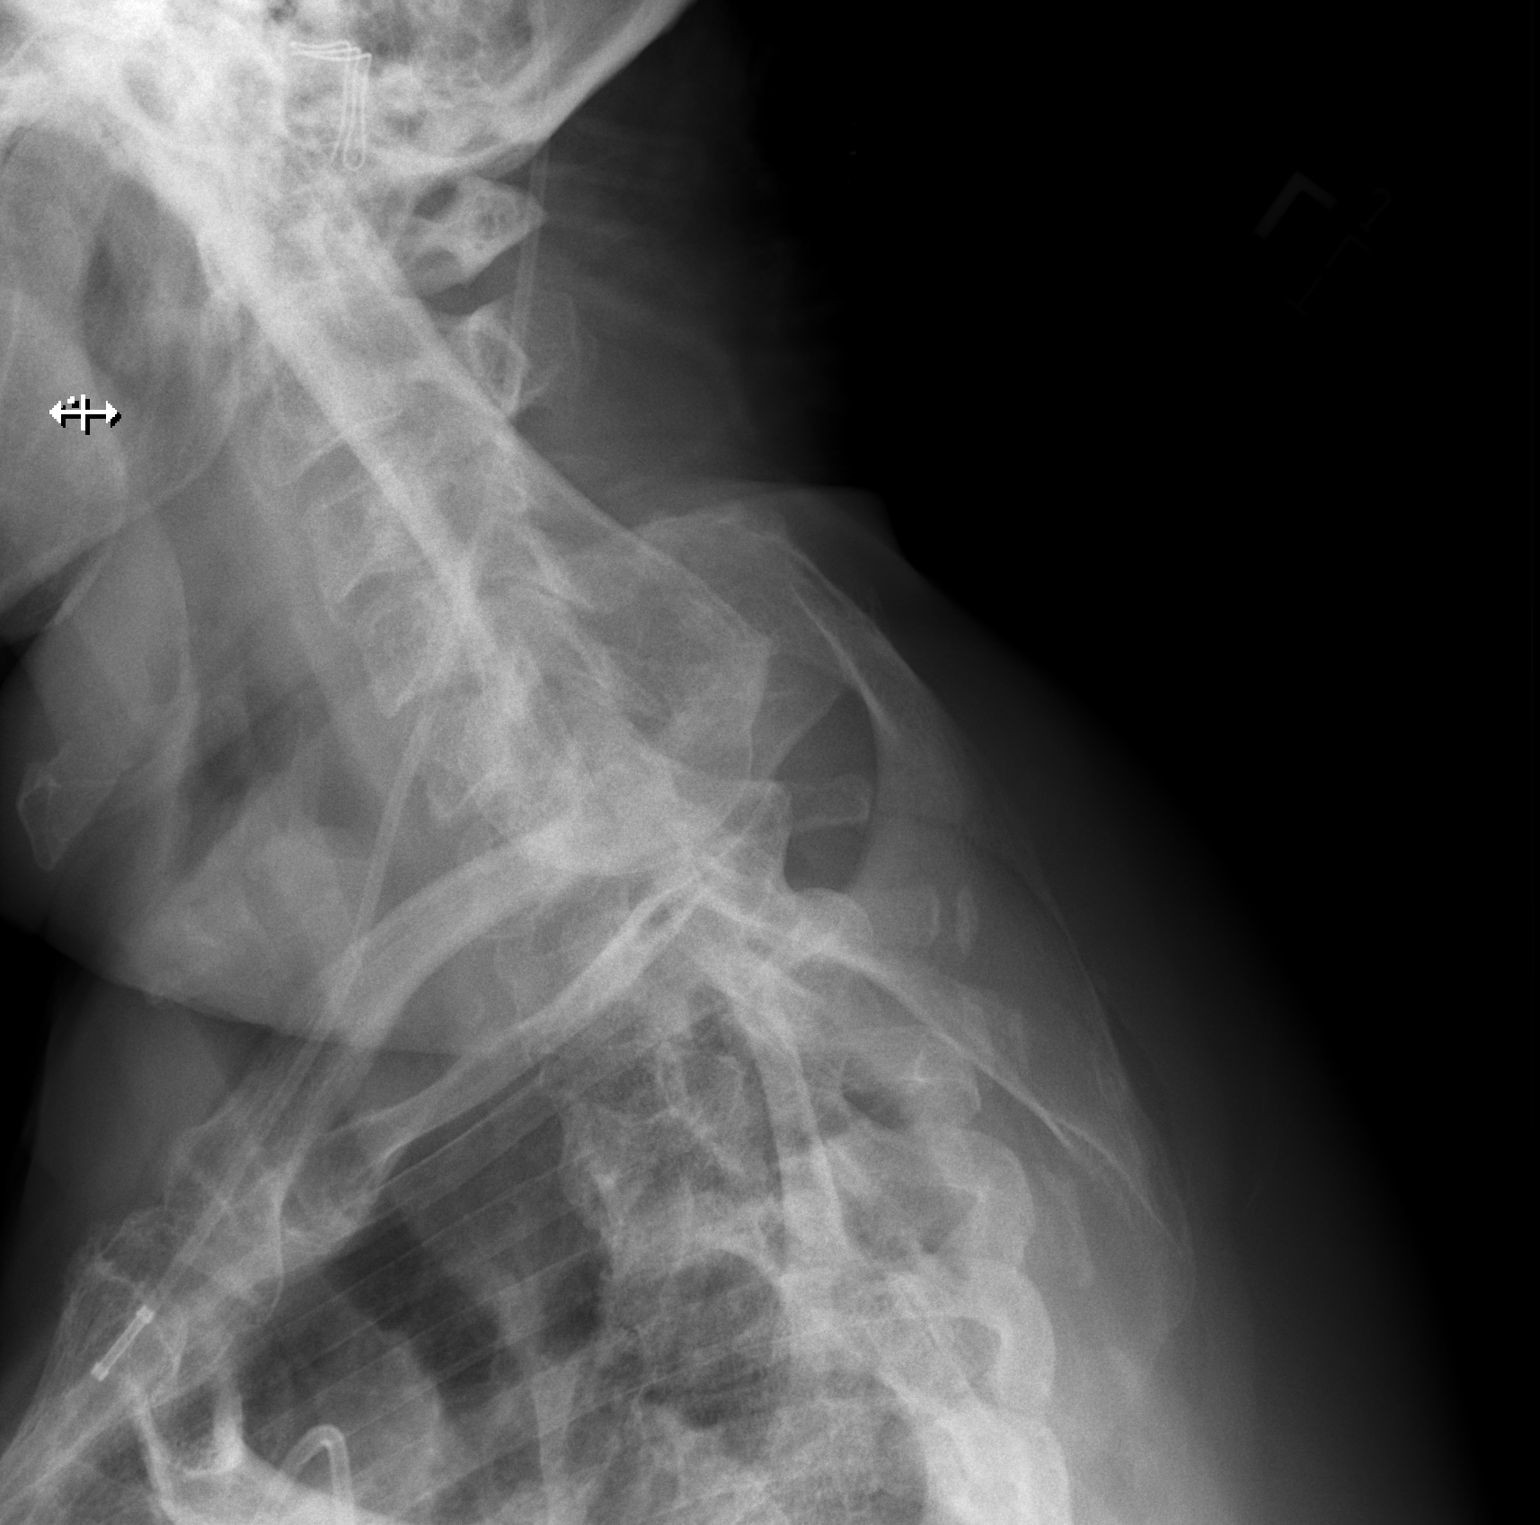

[3 of 3 positions shown; findings below may reference images not displayed]

FINDINGS: Normal alignment. The vertebral body heights are well preserved.
There is no acute fracture or dislocation identified. Multilevel
disc space narrowing and endplate spurring noted compatible with
degenerative disc disease.
IMPRESSION: 1. No acute findings.
2. Degenerative disc disease.

## 2021-06-18 IMAGING — CR DG SHOULDER 2+V*L*
3 series · 3 of 3 positions shown · non-contrast
Comparison: None.

CLINICAL DATA: Pain

EXAM:
LEFT SHOULDER - 2+ VIEW

[w shoulder grashey left]
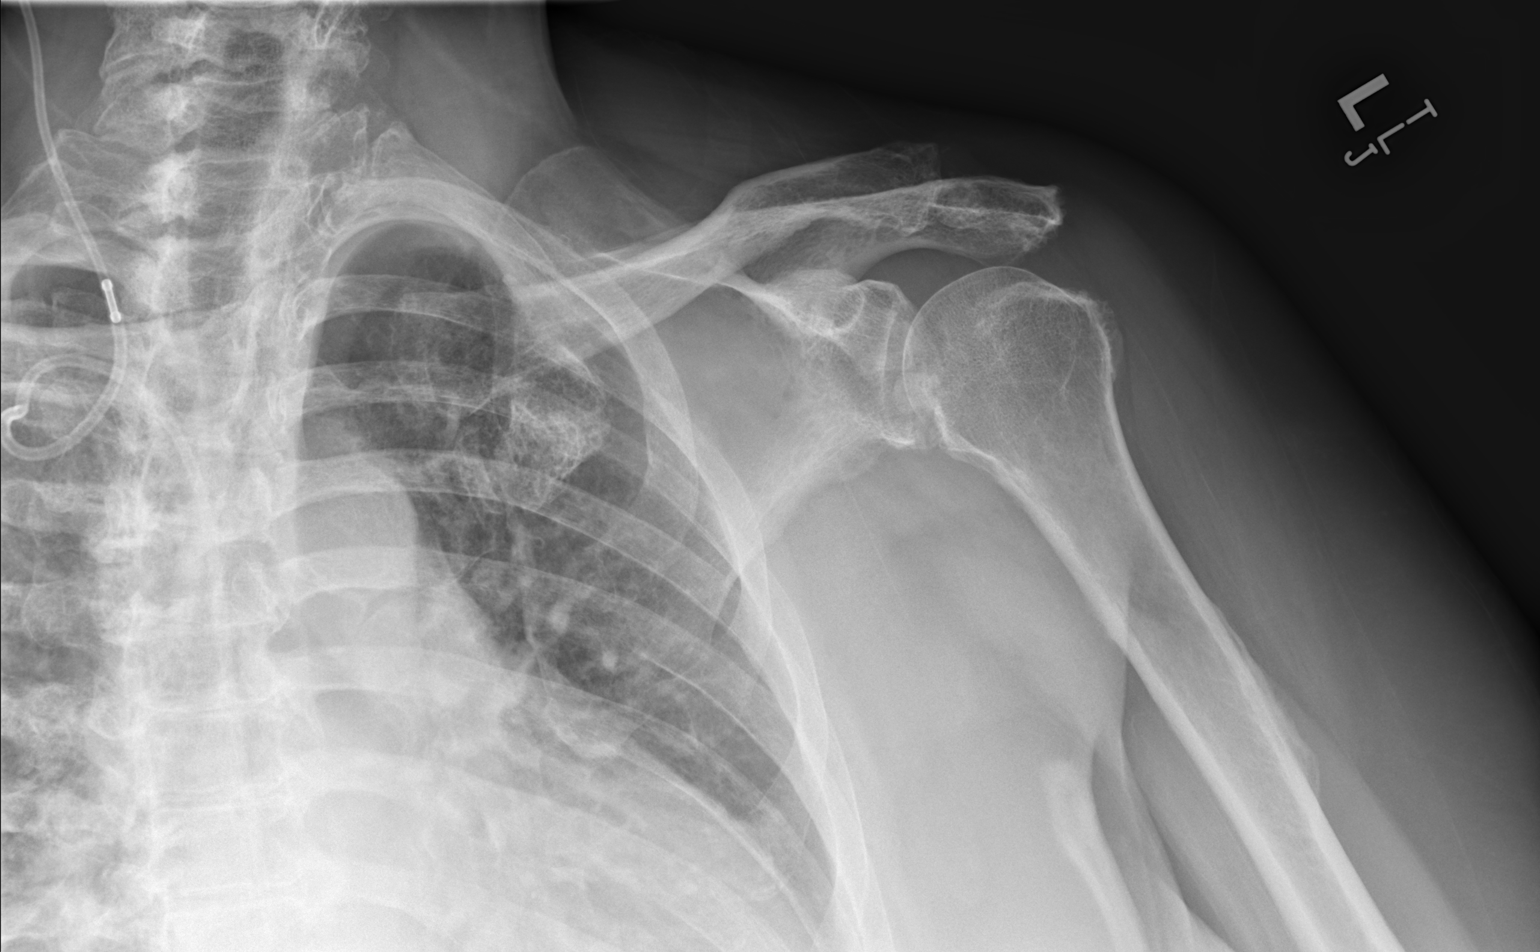

[w shoulder y-view left]
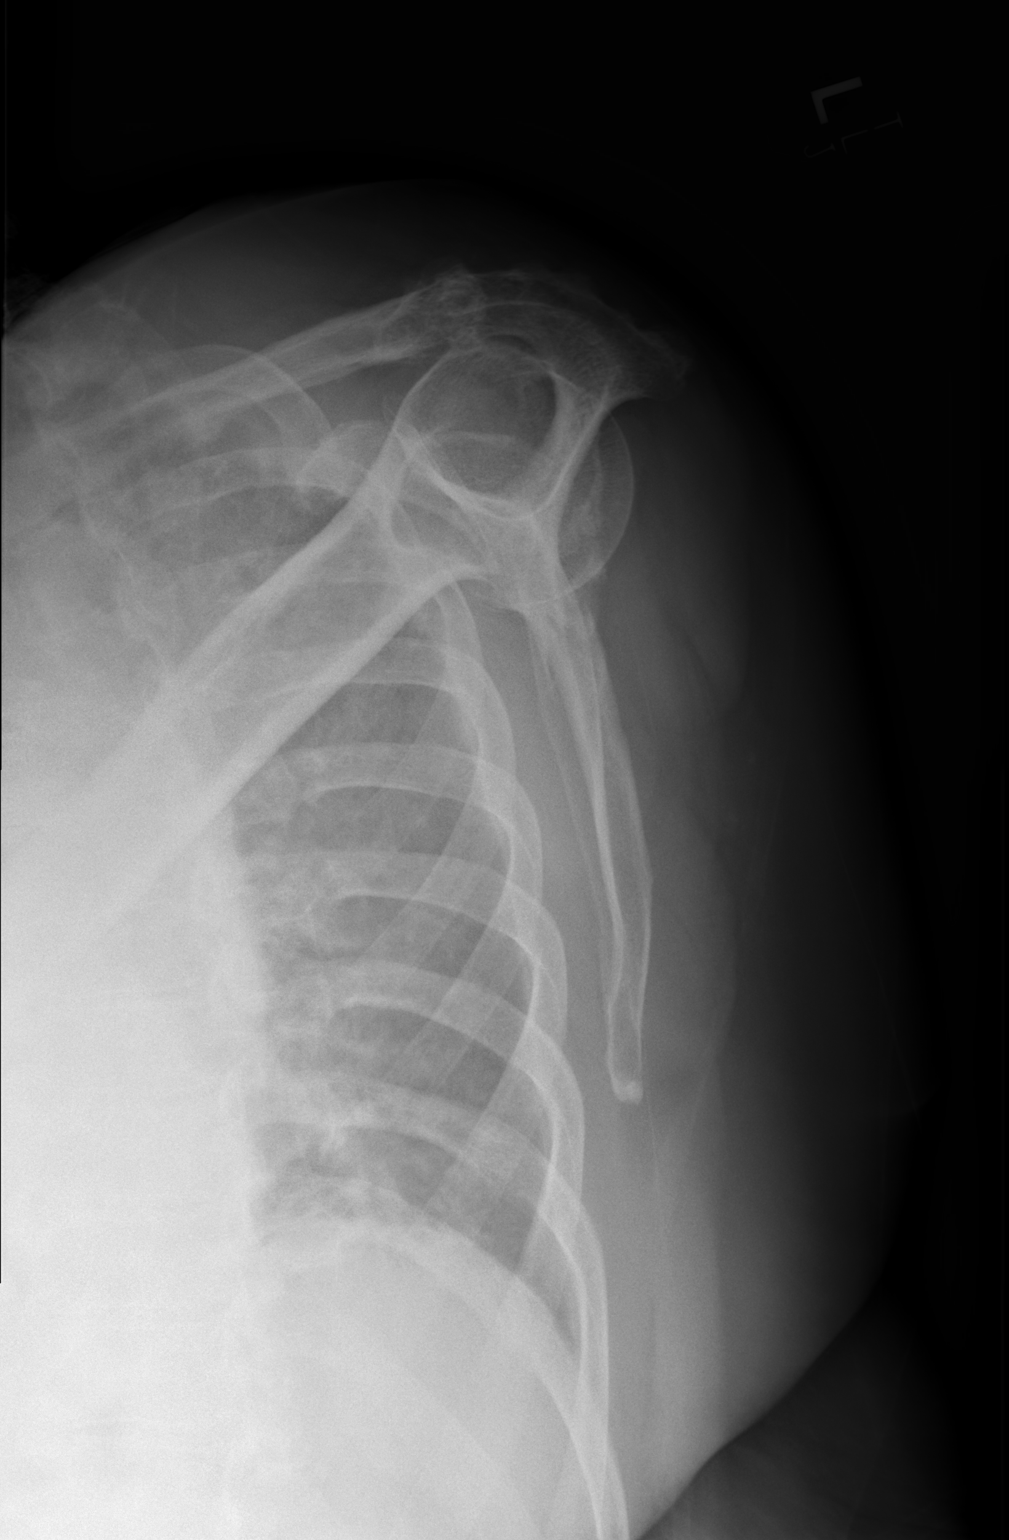

[w shoulder axillary left]
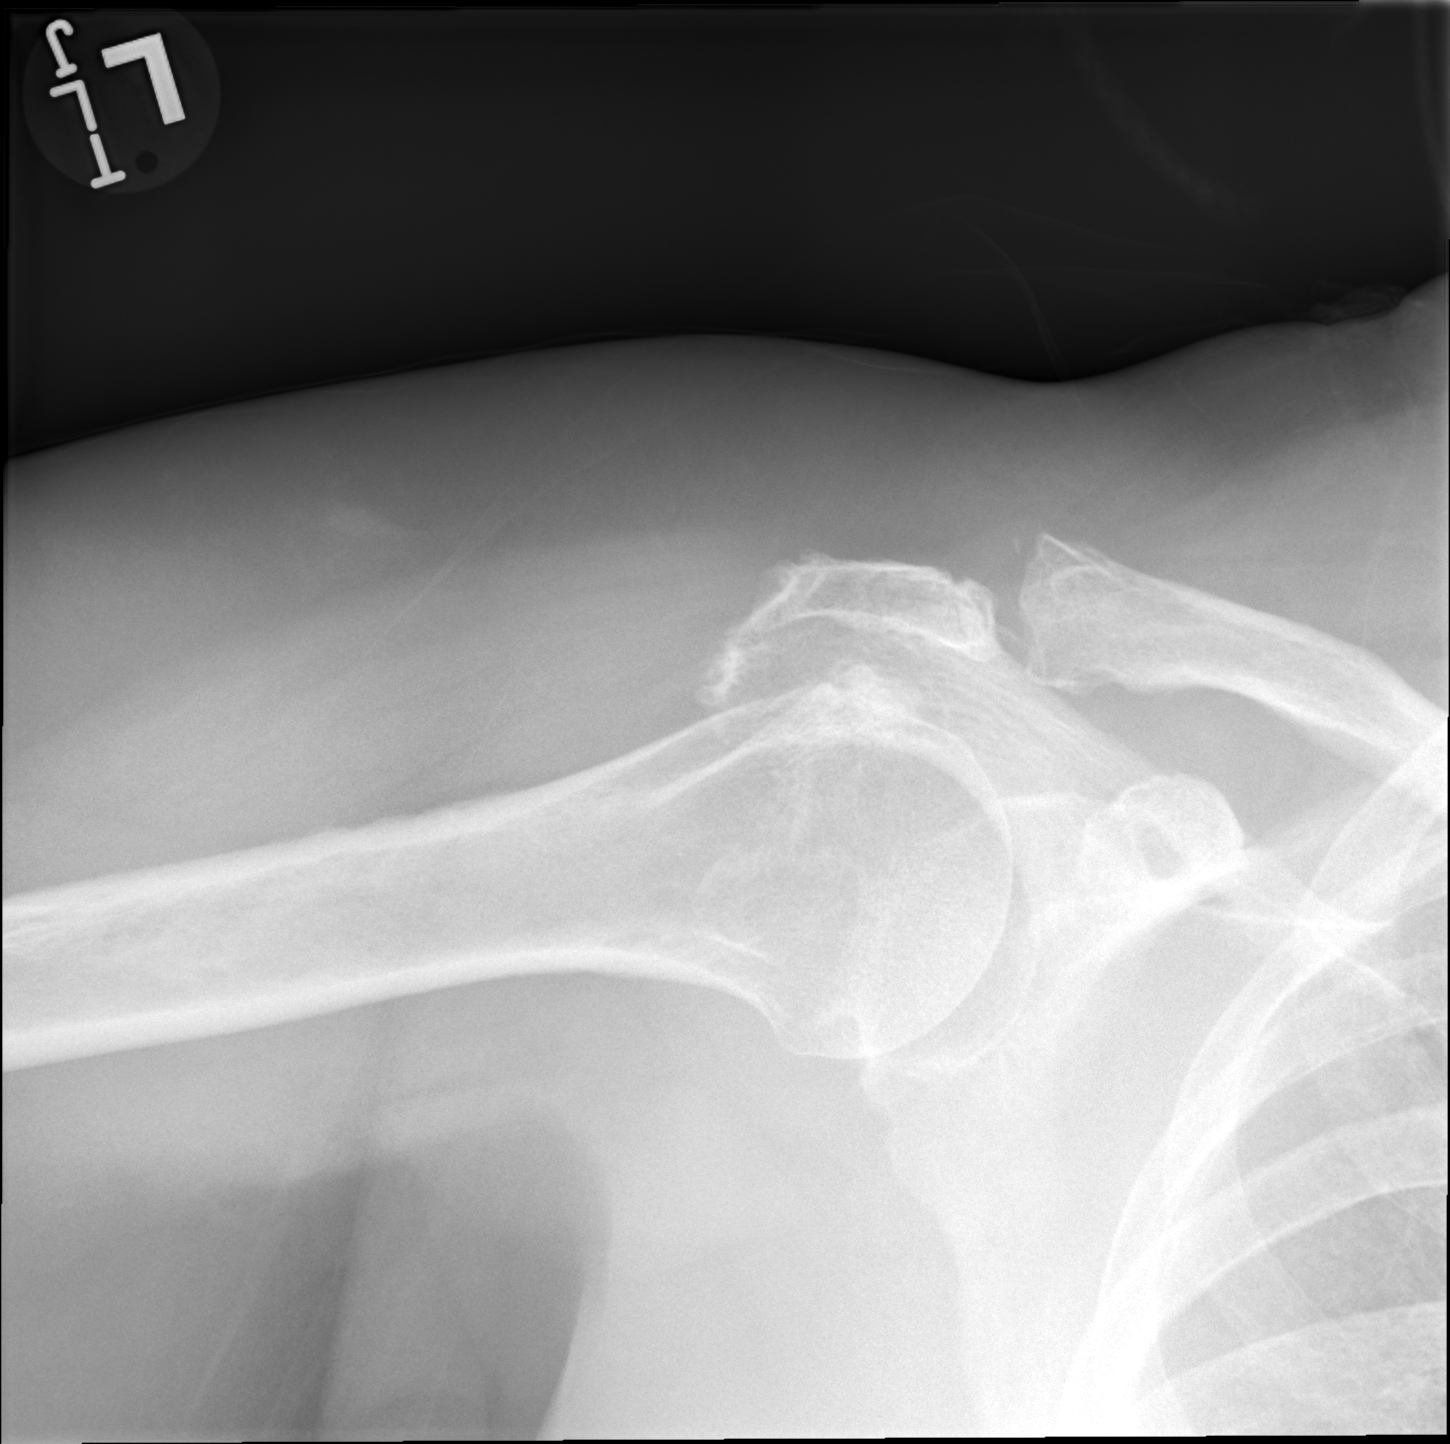

[3 of 3 positions shown; findings below may reference images not displayed]

FINDINGS: Oblique, Y scapular, and axillary images were obtained. No fracture
or dislocation. There is moderate generalized joint space narrowing.
There is bony overgrowth along the inferior clavicle laterally. No
erosive change. Visualized left lung clear. Shunt noted on the
right.
IMPRESSION: Moderate generalized osteoarthritic change. Bony overgrowth along
the inferolateral clavicle potentially places patient at increased
risk for impingement syndrome. No fracture or dislocation.

## 2021-06-18 IMAGING — CR DG CERVICAL SPINE 2 OR 3 VIEWS
4 series · 4 of 4 positions shown · non-contrast
Comparison: None.

CLINICAL DATA: Cervicalgia with left-sided radicular symptoms

EXAM:
CERVICAL SPINE - 2-3 VIEW

[w cervical spine lat]
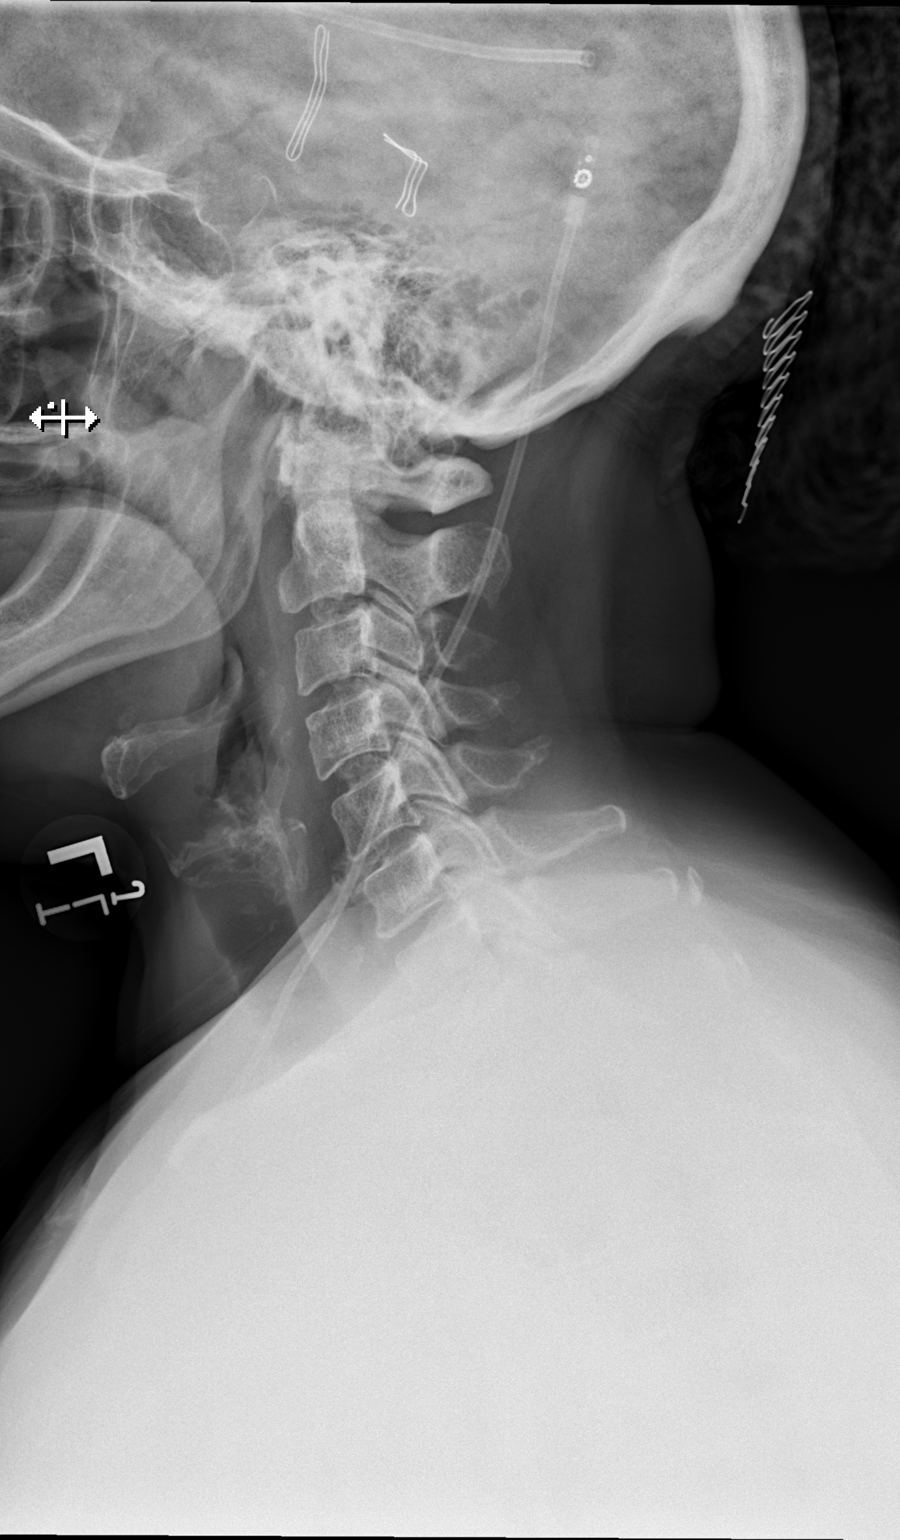

[w cervical spine ap]
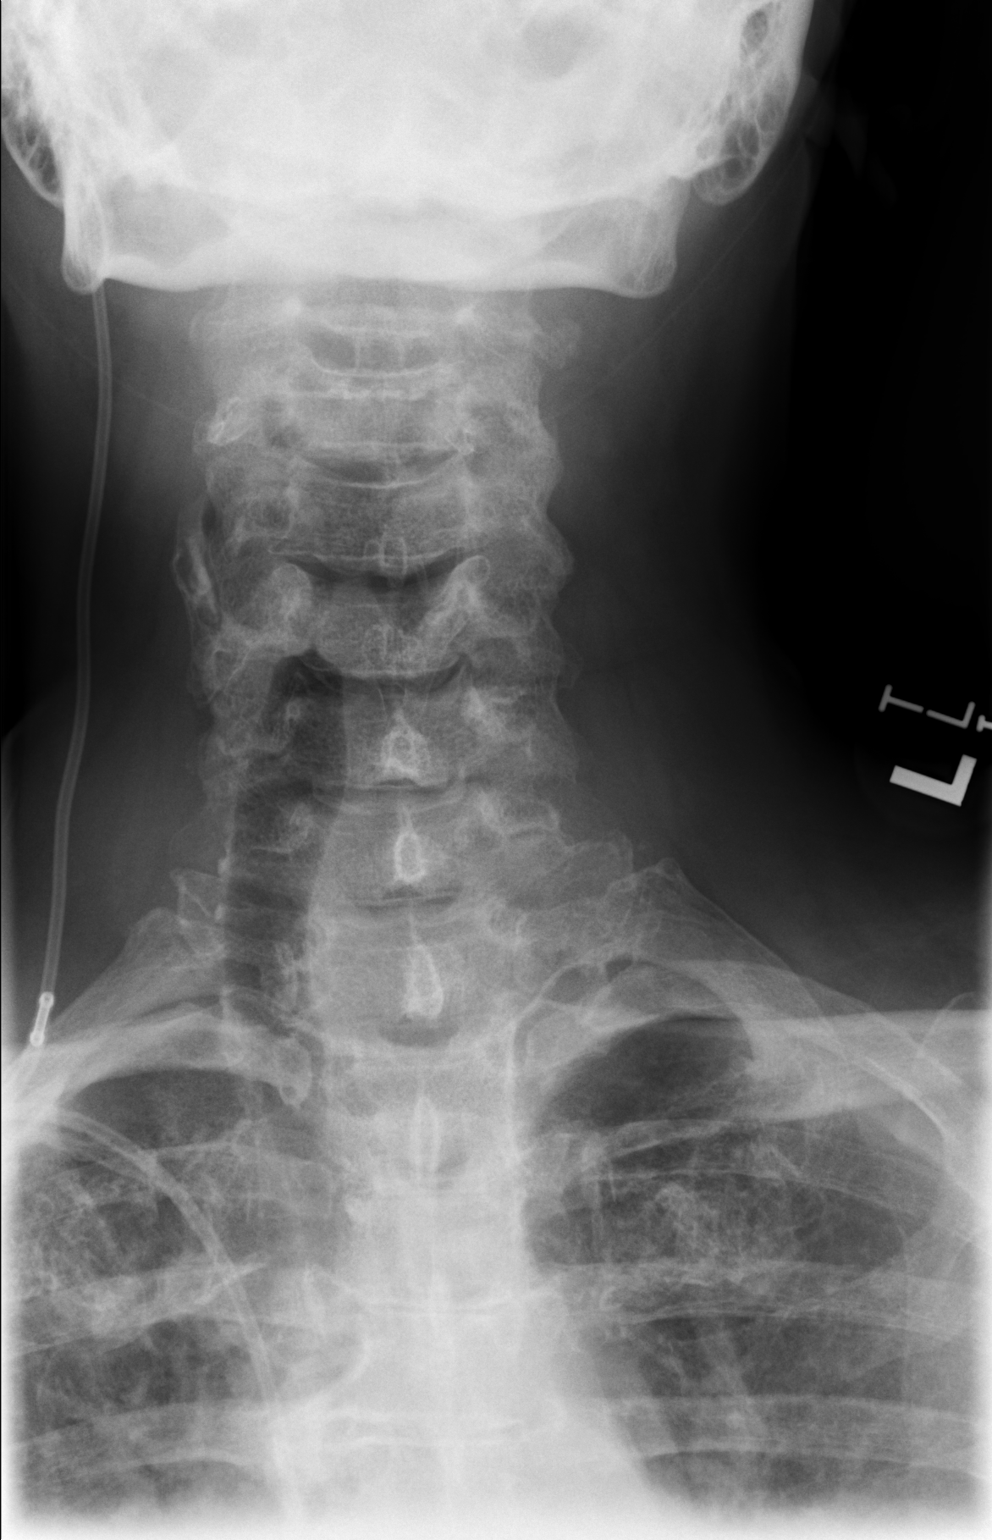

[w cervical spine odontoid (1 of 2)]
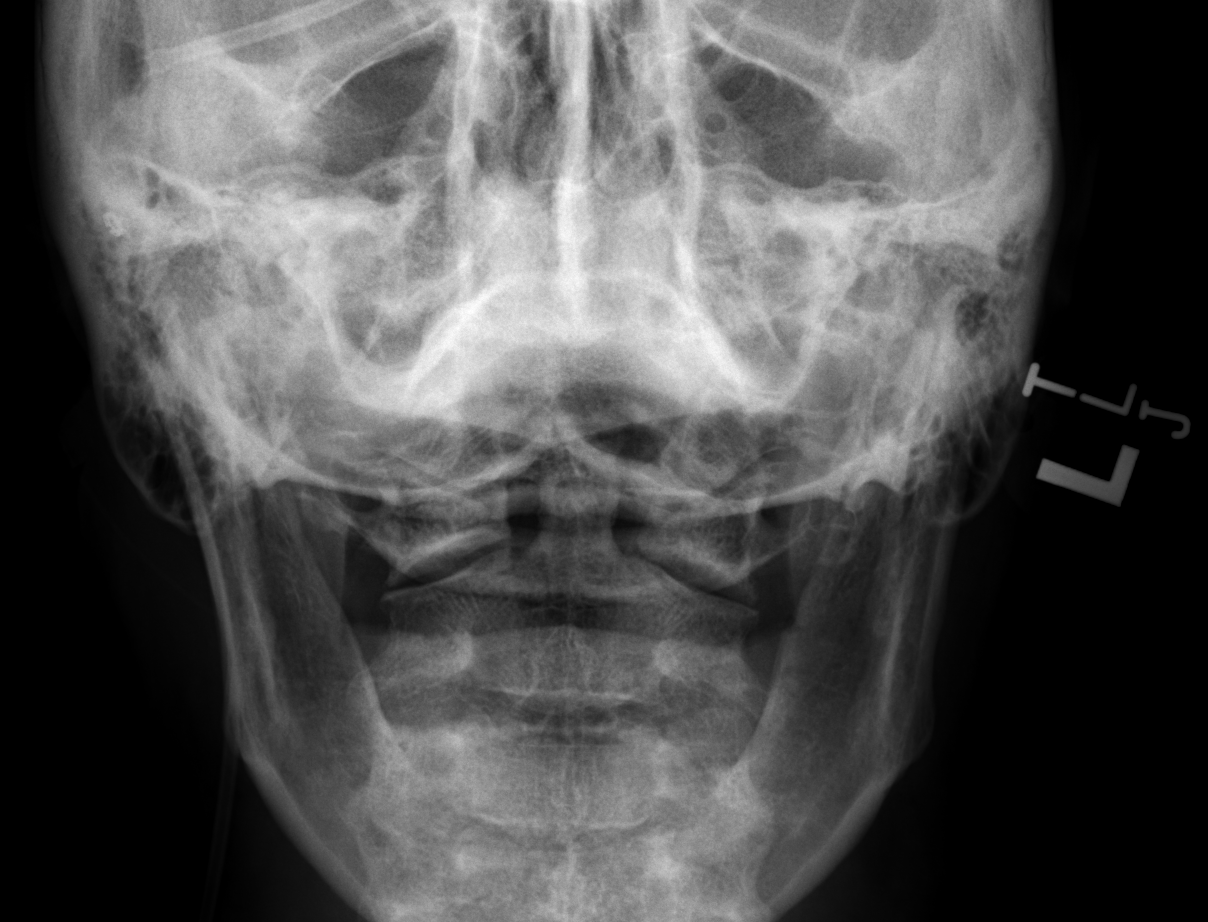

[w cervical spine odontoid (2 of 2)]
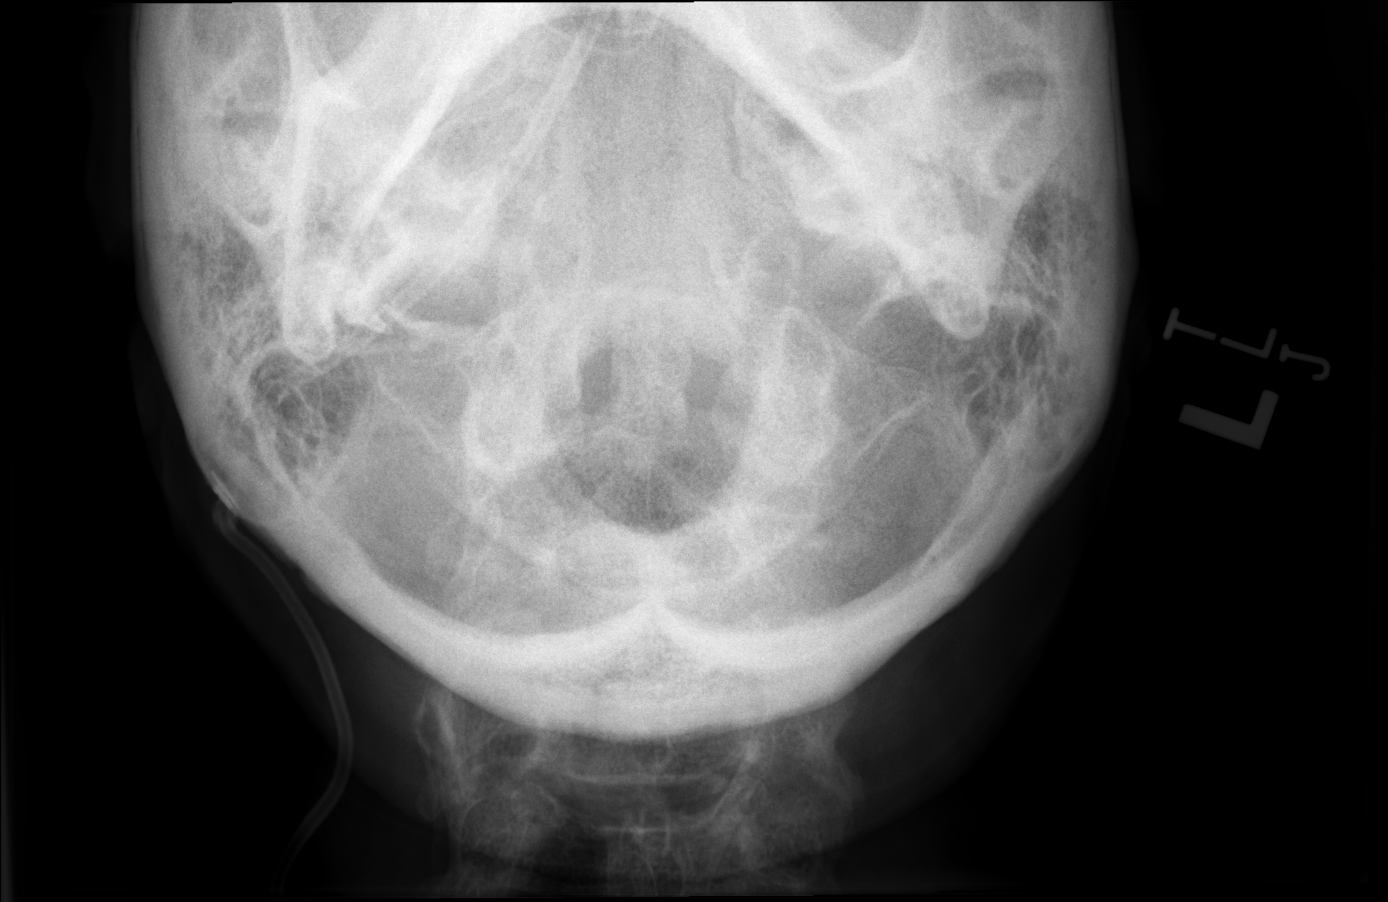

[4 of 4 positions shown; findings below may reference images not displayed]

FINDINGS: Frontal, lateral, and open-mouth odontoid images were obtained. No
fracture or spondylolisthesis. Prevertebral soft tissues and
predental space regions are normal. There is mild disc space
narrowing at C6-7. Other disc spaces appear unremarkable. No erosive
change.

A shunt extends along the right neck region. There is rightward
deviation of the lower cervical and upper thoracic trachea.
IMPRESSION: No fracture or spondylolisthesis. Mild disc space narrowing at C6-7.
Other disc spaces appear unremarkable.

Shift of the lower cervical upper thoracic trachea to the right.
Suspect thyroid enlargement as etiology for this finding.

Shunt catheter along the right neck region.
